# Patient Record
Sex: Male | Born: 1945 | Race: White | Hispanic: No | Marital: Married | Smoking: Former smoker
Health system: Southern US, Community
[De-identification: ages and names within clinical notes are randomized; demographics above are authoritative.]

## PROBLEM LIST (undated history)

## (undated) DIAGNOSIS — I10 Essential (primary) hypertension: Secondary | ICD-10-CM

## (undated) DIAGNOSIS — T884XXA Failed or difficult intubation, initial encounter: Secondary | ICD-10-CM

## (undated) DIAGNOSIS — E785 Hyperlipidemia, unspecified: Secondary | ICD-10-CM

## (undated) DIAGNOSIS — I259 Chronic ischemic heart disease, unspecified: Secondary | ICD-10-CM

## (undated) DIAGNOSIS — E669 Obesity, unspecified: Secondary | ICD-10-CM

## (undated) HISTORY — PX: BACK SURGERY: SHX140

## (undated) HISTORY — DX: Hyperlipidemia, unspecified: E78.5

## (undated) HISTORY — PX: CORONARY ARTERY BYPASS GRAFT: SHX141

## (undated) HISTORY — DX: Obesity, unspecified: E66.9

## (undated) HISTORY — DX: Essential (primary) hypertension: I10

## (undated) HISTORY — DX: Chronic ischemic heart disease, unspecified: I25.9

---

## 1998-05-14 ENCOUNTER — Emergency Department (HOSPITAL_COMMUNITY): Admission: EM | Admit: 1998-05-14 | Discharge: 1998-05-14 | Payer: Self-pay | Admitting: Emergency Medicine

## 1999-08-29 ENCOUNTER — Inpatient Hospital Stay (HOSPITAL_COMMUNITY): Admission: EM | Admit: 1999-08-29 | Discharge: 1999-09-07 | Payer: Self-pay | Admitting: *Deleted

## 1999-08-31 ENCOUNTER — Encounter: Payer: Self-pay | Admitting: Cardiothoracic Surgery

## 1999-09-01 ENCOUNTER — Encounter: Payer: Self-pay | Admitting: Cardiothoracic Surgery

## 1999-09-02 ENCOUNTER — Encounter: Payer: Self-pay | Admitting: Cardiothoracic Surgery

## 1999-09-03 ENCOUNTER — Encounter: Payer: Self-pay | Admitting: Cardiothoracic Surgery

## 1999-09-04 ENCOUNTER — Encounter: Payer: Self-pay | Admitting: Cardiothoracic Surgery

## 1999-09-05 ENCOUNTER — Encounter: Payer: Self-pay | Admitting: Cardiothoracic Surgery

## 2010-03-27 ENCOUNTER — Ambulatory Visit: Payer: Self-pay | Admitting: Cardiology

## 2010-04-01 ENCOUNTER — Ambulatory Visit: Payer: Self-pay | Admitting: Cardiology

## 2010-09-26 NOTE — H&P (Signed)
[place]. [facility]  Patient:    Frank [name], Frank [name]                      MRN: [id number] Adm. Date:  [id number] Attending:  [place], Peter Manning Dictator:   Clovis Pu Patty Sermons, M.D. CC:         [place]. Andrey Campanile, M.D.             CVTS             Peter M. [place], M.D.                         History and Physical  CHIEF COMPLAINT:  Chest pain.  HISTORY OF PRESENT ILLNESS:  This is a 65 year old gentleman with recent onset of exertional chest pain and a two week history of severe hypertension.  He was seen in our office on 08/27/99, at which time his blood pressure was elevated at 196/104, and he gave a history of about one week of exertional chest tightness with activities such as mowing the grass.  He previously had good exercise tolerance, and several weeks ago was able to walk the entire [employer] tournament without any difficulty, and also bike ride without difficulty.  He had been checking his blood pressure intermittently for the past several weeks, and it had been elevated.  He had been on no medication. When seen in the office on 08/27/99, he was felt to have significant hypertensive cardiovascular disease, as well as possible ischemic heart disease with angina.  He was begun on Plavix 75 mg a day, hydrochlorothiazide 25 mg a day, Toprol XL 50 mg a day, and was continued on daily aspirin. Arrangements were made for him to return for a treadmill Cardiolite stress test which was done on 08/29/99.  The patient exercised for 10 minutes on the Bruce protocol.  He had only minimal chest tightness with exercise.  Did not reach his target heart rate.  His target rate was 139, and he reached a maximum of 129.  Test was stopped because of chest tightness and dyspnea and leg cramping.  He had no significant EKG changes during exercise, but immediately post-treadmill did develop striking ST segment elevation in the inferior lead.  He was given a single  nitroglycerin with improvement in his chest discomfort over the next several minutes.  An ambulance was summoned to take him to [facility] Emergency Room.  He was pain-free at the time of his transfer, but it was felt that he would need urgent cardiac catheterization. This was done by Dr. Peter [place] earlier today, and did show significant three-vessel coronary artery disease, and the patient will require bypass surgery.  Of note, is the fact that blood work drawn two days ago was available today, and shows that his cholesterol is elevated at 275, triglycerides of 244, HDL of 34, LDL of 192.  Blood sugar was normal at 101, potassium 4.5, liver function tests normal except SGPT of 46.  His CBC was normal with a hemoglobin of 16.7, hematocrit 49, white count 5600.  FAMILY HISTORY:  The family history reveals that his father died at age 44 of lung cancer, mother died at age 35, and had a lot of vascular blockage in her legs.  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  He is on a no added salt diet.  He drinks 2-6 beers per week.  He drinks a moderate amount of caffeinated iced tea.  The patient is married.  His wife, Frank [name], is a Engineer, civil (consulting).  ALLERGIES:  No known drug allergies.  REVIEW OF SYSTEMS:  GASTROINTESTINAL:  Negative.  GENITOURINARY:  Negative.  The remainder of the review of systems is negative.  PHYSICAL EXAMINATION:  VITAL SIGNS:  Blood pressure is 140/70, pulse 70 and regular.  HEENT:  Unremarkable.  Pupils are equal and reactive.  Extraocular movements are full.  Fundi show no hemorrhages or exudates.  NECK:  Carotids reveal no bruit.  CHEST:  Clear to auscultation and percussion.  HEART:  No murmurs, rubs, gallops, or clicks.  ABDOMEN:  Soft without evidence of hepatosplenomegaly or masses.  EXTREMITIES:  Good pulses, no phlebitis or edema.  IMPRESSION: 1. Ischemic heart disease with three-vessel coronary disease. 2. Chest pain  following treadmill test today, rule out inferior wall    myocardial infarction. 3. Hypercholesterolemia. 4. Hypertensive cardiovascular disease.  DISPOSITION: 1. He is being admitted to telemetry. 2. He will be continued on intravenous heparin and intravenous nitroglycerin,    and he will be treated with beta blockers, aspirin, and lipid lowering    drugs. 3. CVTS will consult regarding coronary bypass surgery.DD:  08/29/99 TD:  08/29/99 Job: [id number]

## 2010-09-26 NOTE — Consult Note (Signed)
. [facility]  Patient:    Frank [name], Frank [name]                      MRN: [id number] Proc. Date: 08/29/99 Adm. Date:  [id number] Attending:  [place], Peter Manning CC:         CVTS             Clovis Pu. Patty Sermons, M.D.             [facility] Cardiology                          Consultation Report  REASON FOR CONSULTATION:  Severe three vessel coronary artery disease with class IV unstable angina.  HISTORY OF PRESENT ILLNESS:  The patient is a 65 year old white male who is admitted to the hospital emergently after developing chest pain and EKG changes  during an exercise tolerance test (stress test).  He developed some substernal tightness and pressure earlier in the week while mowing his lawn.  He was seen y his primary care physician, Dr. Duffy Rhody C. Wilson, and referred to Dr. Clovis Pu. Brackbill for cardiac evaluation.  Because of his ischemic changes during the ETT, he was transferred directly to he emergency room and admitted to the catheterization lab for urgent cardiac catheterization by Dr. Peter M. [place].  This demonstrated severe three vessel coronary disease with 70% stenosis of the LAD, 90% stenosis of the diagonal, 90% stenosis of the ramus intermediate, 95% stenosis of the circumflex, and 90-99% stenosis to the right coronary artery.  His overall ejection fraction was well preserved at 65% and he was stabilized on an IV nitroglycerin and IV heparin.  He was referred for evaluation for coronary revascularization.  His cardiac enzymes are pending.  PAST MEDICAL HISTORY:  The patient has history of mild hypertension and hyperlipidemia.  He was on no regular medications prior to this week.  PAST SURGICAL HISTORY:  Cervical laminectomy and a lumbar laminectomy, both of which were several years ago and were not complicated by any cardiac events.  ALLERGIES:  He is not allergic to any medications.  SOCIAL HISTORY:  He is a nonsmoker.   He is married.  He works as a Human resources officer.  He drinks beer socially.  FAMILY HISTORY:  Positive for peripheral vascular disease in his mother.  REVIEW OF SYSTEMS:  His current weight is 230 pounds and there has been no significant change in this over the past several months.  He denies any recent fever, night sweats, chills, or weakness.  He denies any symptoms of congestive  heart failure including orthopnea, PND, or ankle edema.  He has had angina as stated previously.  His pulmonary history is negative for wheezing, asthma, productive cough, or hemoptysis.  He denies any history of chest trauma.  His GI history is negative for dysphagia, blood per rectum, jaundice, or peptic ulcer disease.  His GU history is negative for nocturia, polyuria, or hematuria.  His  vascular history is negative for claudication, phlebitis, or syncope.  He did have one episode approximately three to four years ago where after spending the day n the [place] and getting into a hot tub he had a brief fainting spell associated with bradycardia which resolved spontaneously.  His neurologic history is negative for seizure, TIA, or CVA.  His hematologic history is negative for bleeding, diaphysis, or easy bruisability.  He has no skin rash or  skin lesion.  He denies any psychological problems with depression, insomnia, or diminished appetite.  PHYSICAL EXAMINATION:  VITAL SIGNS:  He is 5 foot 9 inches and weighs 230 pounds.  His blood pressure s 140/80, pulse is 60 and regular, respirations 18, temperature 98.2.  GENERAL:  That of a middle-aged white male, comfortable in bed following a cardiac catheterization in no distress.  HEENT:  Normocephalic with full EOMs.  Dentition under good repair.  Pharynx is  clear.  NECK:  Supple.  He has good range of motion of the neck.  There are no neck masses, adenopathy, thyromegaly.  No carotid bruit.  No JVD.  LUNGS:  Clear  to auscultation.  There is no thoracic deformity.  CARDIOVASCULAR:  With normal rate and rhythm without S3 gallop or without murmur.  ABDOMEN:  Soft, obese, without mass or organomegaly.  EXTREMITIES:  Full range of motion without warm, tender joints.  There is no evidence of venous insufficiency or edema.  Peripheral pulses are 2+ in the radials and pedals bilaterally.  SKIN:  Clear without rash or lesion.  NEUROLOGICAL:  Alert and oriented x 3 with full motor function.  LABORATORY DATA:  His laboratory data including the findings at catheterization  previously documented.  His hematocrit is 49%.  His platelet count is 286,000. His creatinine is 1.3 mg%.  IMPRESSION:  A 65 year old gentleman with recent onset of angina with unstable angina and severe three vessel coronary disease.  He is scheduled for coronary revascularization on Monday, September 01, 1999.  We will plan on using the left internal mammary artery and the left radial artery for two arterial grafts and he remainder for saphenous vein graft.  I discussed the indications and expected benefits of the operation with the patient and wife.  I discussed the details of the operation including the choice of conduit, placement of the surgical incisions, the use of cardiopulmonary bypass and general anesthesia, and the expected hospital recovery.  I reviewed the associated risks of death, MI, CVA, bleeding.  He understands these implications for surgery and agrees to proceed with the operation as planned under informed consent. DD:  08/29/99 TD:  08/29/99 Job: [id number]

## 2010-09-26 NOTE — Discharge Summary (Signed)
[place]. [facility]  Patient:    Frank [name], Frank [name]                      MRN: [id number] Adm. Date:  [id number] Disc. Date: 09/07/99 Attending:  Kerin Perna Iii Dictator:   Areta Haber, P.A.C. CC:         Frank [name], M.D.             Frank [name], M.D.             Frank [name], M.D.             Frank [name], M.D.                           Discharge Summary  HISTORY OF PRESENT ILLNESS:  This is a 65 year old gentleman with recent onset f exertional chest pain and a two-week history of severe hypertension.  He was evaluated at Dr. Clovis Pu. Brackbills office on August 27, 1999 at which time his blood pressure was elevated at 196/104 and he gave a history of one week of exertional chest tightness with activity such as mowing the grass.  The patient reported previously good exercise tolerance and several weeks ago he was able to walk around the entire [employer] tournament without any difficulty. He also rode bicycles without difficulty.  He had been checking his blood pressure intermittently for the past several weeks and has continued to be elevated.  He was on no medication and was seen in the office and was found to have significant hypertension, cardiovascular disease, s well as possible ischemic heart disease and angina.  He was started on Plavix 75 mg q.d.; hydrochlorothiazide 25 mg q.d.; Toprol XL 50 mg q.d.; as well as one aspirin q.d.  Arrangements were made for exercise treadmill, Cardiolite stress test on August 11, 1999.  He exercised for 10 minutes on the BRUCE protocol with only minimal chest tightness with exercise.  He did not reach his target rate which was 139.  He reached a maximum of 129.  The test was stopped because of chest tightness, dyspnea, and leg cramping.  He had no significant EKG changes during the exercise but immediately post-treadmill did develop striking ST segment elevation in  the  inferior leads.  He was improved with single nitroglycerin and the chest discomfort resolved over the next several minutes.  An ambulance was summoned to take him to [facility] Emergency  Room.  He was pain-free at the time of his transfer and it was felt that he needed urgent cardiac catheterization.  FAMILY HISTORY:  Father died at age 83 of lung cancer.  Mother died at age 63 with peripheral vascular disease.  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  He is on a no added salt diet.  He drinks two to six beers per week.  He drinks a moderate amount of caffeinated ice tea.  He is married.  ALLERGIES:  No known drug allergies.  MEDICATIONS:  As above.  REVIEW OF SYSTEMS:  See history and physical done at the time of admission.  PHYSICAL EXAMINATION:  Please see history and physical done at the time of admission.  HOSPITAL COURSE:  The patient was admitted through the emergency room for cardiac catheterization.  He was found to have severe three vessel coronary artery disease including 50-70%  segmental mid-LAD after the diagonal-2 and an ostial diagonal-1 lesion.  He additionally had a 90% ostial intermediate lesion, a 95% left circumflex mid-artery lesion, a 90% proximal right coronary artery lesion, a 99% distal right coronary artery lesion.  A left ventriculogram was within normal limits with an ejection fraction of 65%.  Cardiac surgical consultation was then obtained with Dr. Kathlee Nations Trigt III who evaluated the patient and his studies and agreed with recommendations for coronary artery bypass grafting.  On September 01, 1999, the patient underwent the following procedure of coronary artery bypass grafting x 6.  The following grafts were placed:  Left internal mammary artery to the LAD, saphenous vein graft to the diagonal, sequential saphenous vein graft to the ramus intermedius and obtuse marginal, saphenous vein  graft to the  posterolateral, left radial artery to the posterior descending.  The patient tolerated the procedure well and was taken to the surgical intensive care unit n stable condition.  The patient has done well.  He has maintained stable hemodynamics and was weaned from the ventilator without difficulty.  He did require blood transfusion postoperative day #1 for a hemoglobin of 7.5.  His progress in regards to cardiac rehabilitation has been good.  He has required a moderate amount of diuresis and this will be continued postdischarge.  He has a small left apical pneumothorax which has shown gradual resolution with no significant difficulties weaning oxygen. He maintains good saturations currently on room air of 94%.  He has maintained  normal sinus rhythm.  His incisions are healing well without signs of infection. He is tolerating a diet.  He has had a bowel movement postsurgery.  He is afebrile.  Most recent laboratory values are felt to be stable.  Hemoglobin and hematocrit  dated September 04, 1999 are 8.7 and 24.0.  Electrolytes same date show sodium 134,  potassium 3.9, chloride 100, carbon dioxide 28, glucose 130, BUN 19, creatinine  0.8.  Overall, the patient is felt to be quite stable for discharge on todays date, September 07, 1999.  CONDITION ON DISCHARGE:  Stable and improving.  DISCHARGE MEDICATIONS: 1. Lipitor 10 mg q.d. 2. Enteric-coated aspirin 325 mg q.d. 3. Ferrous gluconate 300 mg b.i.d. 4. Vitamin C 500 mg b.i.d. 5. Multivitamin 1 q.d. 6. Tenormin 12.5 mg b.i.d. 7. Imdur 30 mg q.d. for one month for radial artery graft. 8. Lasix 40 mg q.d. x 2 weeks. 9. Potassium chloride 20 mEq q.d. x 2 weeks.  FOLLOW-UP:  Follow up will include in two weeks with Dr. Clovis Pu. Brackbill and three weeks with Dr. Kathlee Nations Trigt III.  FINAL DIAGNOSES: 1. Coronary artery disease. 2. Hypercholesterolemia. 3. Hypertension. 4. Remote tobacco abuse. 5.  Postoperative anemia.   DISCHARGE INSTRUCTIONS:  The patient received written instructions regarding medications, activities, diet, wound care, and follow-up. DD:  09/07/99 TD:  09/07/99 Job: [id number]

## 2010-09-26 NOTE — Op Note (Signed)
[place]. [facility]  Patient:    Frank [name], Frank [name]                      MRN: [id number] Proc. Date: 09/01/99 Adm. Date:  [id number] Attending:  Mikey Bussing CC:         CVTS office             Clovis Pu. Patty Sermons, M.D.             [facility] Cardiology                           Operative Report  PREOPERATIVE DIAGNOSIS:  Class 4 unstable angina with severe three vessel coronary disease.  POSTOPERATIVE DIAGNOSIS:  Class 4 unstable angina with severe three vessel coronary disease.  OPERATION:  Coronary artery bypass grafting x 6 (left internal mammary artery to LAD, left radial artery to posterior descending, saphenous vein graft to diagonal, sequential saphenous vein graft to ramus intermediate and OM1, and saphenous vein graft to posterolateral branch on the right).  SURGEON:  Mikey Bussing, M.D.  ASSISTANT:  Areta Haber, P.A.-C.  ANESTHESIA:  General by Janetta Hora. Gelene Mink, M.D.  INDICATIONS:  The patient is a 65 year old male with a long history of coronary  disease who presented with new onset chest pain and was evaluated with urgent cath after he developed EKG changes during a treadmill test.  Cardiac cath demonstrated severe three vessel coronary disease including 99% stenosis of the right coronary, 80% of the LAD, and 95% stenosis of the circumflex.  He was referred for coronary revascularization.  Prior to the operation, the patient was examined in his hospital room following his cardiac catheterization.  The results of the cardiac catheterization were reviewed to the patient and his wife.  The indications and expected benefits of coronary  bypass grafting were discussed.  I reviewed the major aspects of the operation including the location of the surgical incisions, the choice of conduit including mammary artery and radial artery due to his young age, the use of general anesthesia and cardiopulmonary bypass,  and the expected hospital recovery.  We reviewed the associated risk factors of MI, CVA, bleeding, infection, and death. He understood these implications for surgery, the alternatives of surgery, and agreed to proceed with the operation as planned under informed consent.  OPERATIVE FINDINGS:  The patients coronaries are heavily diseased with calcium.  The proximal portion of the aortic root was heavily calcified and only three proximal vein grafts were placed.  A radial artery free graft was placed in the  proximal portion of the diagonal vein due to disease of the ascending aorta. His coronaries also had diffuse atherosclerotic disease.  The saphenous vein was of  above average quality.  Both mammary artery and radial artery were small, approximately 2 mm, but had excellent flow.  DESCRIPTION OF PROCEDURE:  The patient was brought to the operating room and placed supine on the operating room table where general anesthesia was induced under invasive hemodynamic monitoring.  The left arm was prepped and draped as a sterile field, and the left radial artery was then harvested as a free graft, and flushed with papaverine and heparin solution.  The arm incision was closed in two layers using Vicryl, and then the arm was dressed and tucked on the side.  The patient was then prepped and draped as a sterile field, and a median sternotomy  was performed as the saphenous vein was harvested from the right lower extremity.  The left internal mammary artery was harvested as a pedicle graft from its origin at the  subclavian vessels, and was a good vessel with excellent flow.  Heparin was administered systemically and the ACT was documented as being therapeutic.  The  sternal retractor was placed and the pericardium was opened.  Pursestrings were  placed in the ascending aorta and the right atrium.  The patient was cannulated and placed on cardiopulmonary bypass and cooled to 32 degrees.   The heart was inspected and found to be mildly large consistent with a history of mild hypertension. The myocardium was without scarring or fibrosis.  The coronaries were identified and the conduits were prepared.  A cardioplegia cannula was placed and the patient as cooled to 28 degrees.  As the aortic cross-clamp was applied, 600 cc of cold blood cardioplegia was delivered to the aortic root with immediate cardioplegic arrest and septal temperature dropping less than 12 degrees.  Topical ice saline slush was used to augment myocardial preservation and a pericardial insufflator pad was used to protect the left phrenic nerve.  The distal coronary anastomoses were then performed.  The first distal anastomosis was to the diagonal.  It was a 1.5 mm vessel with proximal 95% stenosis and a reversed saphenous vein was sewn end-to-side with a running 7-0 Prolene.  The second distal anastomosis was to the ramus intermediate and the OM1 with a sequential vein graft.  The ramus intermediate was a 1.5 mm vessel with proximal 90% stenosis and a side branch was sewn side-to-side with running 7-0 Prolene.   The third distal anastomosis was a continuation to the OM2 which was a 1.5 mm vessel with proximal 90% stenosis, and the end of the vein was end-to-side with a running 7-0 Prolene.  Cardioplegia was redosed.  The fourth distal anastomosis was to the posterolateral branch of the right coronary which was a 1.8 mm vessel with a proximal 95% stenosis, and reversed saphenous vein sewn end-to-side with a running 7-0 Prolene.  The fifth distal anastomosis was the free left radial artery graft to the posterior descending which was a 1.8 mm vessel with proximal 95% stenosis, and the artery was sewn end-to-side with a running 8-0 Prolene.  Cardioplegia was redosed.  The sixth distal anastomosis was to the distal LAD which was a 1.5 mm vessel with proximal 90% stenosis.  The left internal  mammary artery pedicle was brought through an opening created in the left lateral pericardium and was brought down on  the ________ and sewn end-to-side with a running 8-0 Prolene.  There was excellent flow through the anastomosis with immediate rise in septal temperature after release of the pedicle clamp on the mammary artery.  The mammary and pedicle was secured to the epicardium.  The aortic cross-clamp was removed.  The heart resumed a spontaneous rhythm.  A partial occluding clamp was placed on the ascending aorta and three proximal vein anastomoses were placed carefully with disease in the inferior aspect of the aorta, preventing a fourth aortotomy for he radial artery free graft.  The partial occluding clamp was removed and the radial artery graft was sewn to the middle vein anastomosis to the diagonal in an end-to-side fashion with a running 7-0 Prolene.  There was excellent flow through all grafts after release of the Fogarty vascular clamps.  The patient was then rewarmed and reperfused.  The patient remained in a sinus rhythm.  He was warmed to 37 degrees.  Temporary pacing wires were applied.  When the patient reached  37 degrees, the lungs re-expanded, and the ventilator was turned back on.  The patient then weaned from cardiopulmonary bypass without difficulty, without inotropics, with stable blood pressure and hemodynamics.  Protamine was administered and cannulas were removed.  The mediastinum was irrigated with warm antibiotic irrigation.  The mediastinum had adequate hemostasis.  The leg incision was closed in a standard fashion.  The pericardium was loosely  reapproximated.  Two mediastinal and a left pleural chest tube were placed and brought out through separate incisions.  The sternum was reapproximated with interrupted steel wire.  The pectoralis fascia and the subcutaneous layers were  closed with a running Vicryl.  The skin was closed with a  subcuticular and sterile dressings were applied.  Total cardiopulmonary bypass time was 128 minutes with  aortic cross-clamp time of 80 minutes. DD:  09/01/99 TD:  09/02/99 Job: [id number]

## 2010-09-26 NOTE — Cardiovascular Report (Signed)
[facility]  Patient:    GOVERNOR, MATOS                      MRN: [id number] Proc. Date: 08/29/99 Adm. Date:  [id number] Attending:  [place], Peter Manning CC:         Hardie A. Patty Sermons, M.D.             Mikey Bussing, M.D.             Cardiac Catheterization Lab                        Cardiac Catheterization  INDICATIONS FOR PROCEDURE:  The patient is a 65 year old white male with recent onset of chest pain.  While performing an exercise stress test earlier today, the patient developed acute chest pain with marked ST elevation inferiorly.  ACCESS:  Via the right femoral artery using standard Seldinger technique.  EQUIPMENT:  The 6-French 4-cm right and left Judkins catheters, 6-French pigtail catheter, 6-French arterial sheath.  MEDICATIONS:  Local anesthesia 1% Xylocaine.  CONTRAST:  Omnipaque, 130 cc.  HEMODYNAMIC DATA:  Aortic pressure is 133/73 with a mean of 95.  Left ventricular pressure is 126 with and EDP of 13 mmHg.  ANGIOGRAPHIC DATA:  The left coronary artery arises and distributes normally. 1. The left main coronary artery is without significant disease. 2. The left anterior descending artery is diffusely diseased.  It has a 50%    stenosis following the first diagonal and first septal perforator.    Following the second diagonal, the LAD has a 70% segmental stenosis.  The    first diagonal branch has a 90% stenosis at its ostium. 3. There is a moderate sized intermedius vessel which has a 90% stenosis    proximally. 4. The left circumflex coronary has a 90% stenosis in the mid vessel prior to    an obtuse marginal vessel. 5. The right coronary artery arises and distributes normally.  It is a    dominant vessel.  There is a focal 90% stenosis proximally.  There is a    long segmental 80% stenosis in the crux followed by a 99% stenosis in the    distal right coronary artery prior to the takeoff of the PDA.  Left  ventricular angiography performed in the RAO view:  This demonstrates normal left ventricular chamber size, wall thickness, and contractility with normal systolic function.  Ejection fraction is estimated at 65%.  There is no mitral regurgitation or prolapse.  FINAL INTERPRETATION: 1. 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 2. Normal left ventricular function.  PLAN:  Would recommend coronary artery bypass surgery. DD:  08/29/99 TD:  08/31/99 Job: [id number]

## 2010-10-09 ENCOUNTER — Ambulatory Visit: Payer: Self-pay | Admitting: Cardiology

## 2010-10-20 ENCOUNTER — Other Ambulatory Visit: Payer: Self-pay | Admitting: *Deleted

## 2010-10-20 MED ORDER — SIMVASTATIN 80 MG PO TABS: 80.0000 mg | ORAL_TABLET | Freq: Every day | ORAL | Status: DC

## 2010-10-20 MED ORDER — RAMIPRIL 10 MG PO CAPS: 10.0000 mg | ORAL_CAPSULE | Freq: Every day | ORAL | Status: DC

## 2010-10-20 NOTE — Telephone Encounter (Signed)
Refilled meds per fax request.  

## 2011-01-08 ENCOUNTER — Other Ambulatory Visit: Payer: Self-pay | Admitting: *Deleted

## 2011-01-08 DIAGNOSIS — I10 Essential (primary) hypertension: Secondary | ICD-10-CM

## 2011-01-08 MED ORDER — ATENOLOL 25 MG PO TABS: ORAL_TABLET | ORAL | Status: DC

## 2011-01-08 NOTE — Telephone Encounter (Signed)
Refilled atenolol

## 2011-10-17 ENCOUNTER — Encounter: Payer: Self-pay | Admitting: *Deleted

## 2011-10-26 ENCOUNTER — Other Ambulatory Visit: Payer: Self-pay | Admitting: Cardiology

## 2011-10-28 NOTE — Telephone Encounter (Signed)
Fax Received. Refill Completed. Frank [name] (R.M.A)   

## 2012-02-02 ENCOUNTER — Other Ambulatory Visit: Payer: Self-pay | Admitting: Cardiology

## 2012-02-02 DIAGNOSIS — I25810 Atherosclerosis of coronary artery bypass graft(s) without angina pectoris: Secondary | ICD-10-CM

## 2012-07-06 ENCOUNTER — Encounter: Payer: Self-pay | Admitting: Cardiology

## 2012-11-09 ENCOUNTER — Other Ambulatory Visit: Payer: Self-pay | Admitting: *Deleted

## 2012-11-09 DIAGNOSIS — E7800 Pure hypercholesterolemia, unspecified: Secondary | ICD-10-CM

## 2012-11-09 MED ORDER — SIMVASTATIN 80 MG PO TABS: ORAL_TABLET | ORAL | Status: DC

## 2012-11-09 MED ORDER — RAMIPRIL 10 MG PO CAPS: ORAL_CAPSULE | ORAL | Status: DC

## 2012-11-09 NOTE — Telephone Encounter (Signed)
Ok to fill per  Dr. Patty Sermons, note to call for an appointment

## 2012-12-05 ENCOUNTER — Other Ambulatory Visit: Payer: Self-pay | Admitting: *Deleted

## 2012-12-05 MED ORDER — RAMIPRIL 10 MG PO CAPS: ORAL_CAPSULE | ORAL | Status: DC

## 2013-02-15 ENCOUNTER — Encounter: Payer: Self-pay | Admitting: Cardiology

## 2013-02-15 ENCOUNTER — Ambulatory Visit (INDEPENDENT_AMBULATORY_CARE_PROVIDER_SITE_OTHER): Payer: Medicare Other | Admitting: Cardiology

## 2013-02-15 VITALS — BP 132/70 | HR 54 | Ht 69.0 in | Wt 252.0 lb

## 2013-02-15 DIAGNOSIS — I119 Hypertensive heart disease without heart failure: Secondary | ICD-10-CM

## 2013-02-15 DIAGNOSIS — I25810 Atherosclerosis of coronary artery bypass graft(s) without angina pectoris: Secondary | ICD-10-CM

## 2013-02-15 DIAGNOSIS — E7800 Pure hypercholesterolemia, unspecified: Secondary | ICD-10-CM | POA: Insufficient documentation

## 2013-02-15 DIAGNOSIS — I259 Chronic ischemic heart disease, unspecified: Secondary | ICD-10-CM

## 2013-02-15 MED ORDER — SIMVASTATIN 80 MG PO TABS: ORAL_TABLET | ORAL | Status: DC

## 2013-02-15 MED ORDER — ATENOLOL 25 MG PO TABS: ORAL_TABLET | ORAL | Status: DC

## 2013-02-15 MED ORDER — EZETIMIBE 10 MG PO TABS: 10.0000 mg | ORAL_TABLET | Freq: Every day | ORAL | Status: DC

## 2013-02-15 MED ORDER — TADALAFIL 20 MG PO TABS: 20.0000 mg | ORAL_TABLET | ORAL | Status: DC | PRN

## 2013-02-15 MED ORDER — RAMIPRIL 10 MG PO CAPS: ORAL_CAPSULE | ORAL | Status: DC

## 2013-02-15 NOTE — Assessment & Plan Note (Signed)
The patient remains on simvastatin 80 mg daily.  Is not having any myalgias.  We are checking fasting lab work today.

## 2013-02-15 NOTE — Assessment & Plan Note (Signed)
Blood pressure was remaining stable.  No headaches dizziness or syncope.  No symptoms of CHF.  No palpitations.

## 2013-02-15 NOTE — Patient Instructions (Signed)
Return soon for labs  Your physician recommends that you continue on your current medications as directed. Please refer to the Current Medication list given to you today.  Your physician wants you to follow-up in: 6 months with fasting labs (lp/bmet/hfp)  You will receive a reminder letter in the mail two months in advance. If you don't receive a letter, please call our office to schedule the follow-up appointment.

## 2013-02-15 NOTE — Progress Notes (Signed)
Frank [name] Date of Birth:  11-11-1945 [facility] [address] [place], [place]  [address] [phone]         Fax   [phone]  History of Present Illness: This pleasant 67 year old gentleman is seen after a long absence.  He has a history of known ischemic heart disease.  He had coronary artery bypass graft surgery in 2001.  His last Myoview was 03/12/05 and at that time he had an ejection fraction of 50% and no evidence of ischemia.  He has a past history of hypercholesterolemia and essential hypertension and exogenous obesity.  His last visit he has been doing well.  He is trying to lose weight and since we last saw him in 2011 his weight is down 7 more pounds.  Current Outpatient Prescriptions  Medication Sig Dispense Refill  . Ascorbic Acid (VITAMIN C) 100 MG tablet Take 100 mg by mouth daily.      Marland Kitchen aspirin 325 MG tablet Take 325 mg by mouth daily.      Marland Kitchen atenolol (TENORMIN) 25 MG tablet take 1/2 tablet by mouth twice a day  90 tablet  3  . ezetimibe (ZETIA) 10 MG tablet Take 1 tablet (10 mg total) by mouth daily.  90 tablet  3  . naproxen sodium (ANAPROX) 220 MG tablet Take 220 mg by mouth as needed.      . ramipril (ALTACE) 10 MG capsule take 1 capsule by mouth once daily  90 capsule  3  . simvastatin (ZOCOR) 80 MG tablet take 1 tablet by mouth at bedtime  90 tablet  3  . tadalafil (CIALIS) 20 MG tablet Take 1 tablet (20 mg total) by mouth as needed.  10 tablet  prn   No current facility-administered medications for this visit.    Not on File  Patient Active Problem List   Diagnosis Date Noted  . Ischemic heart disease 02/15/2013  . Pure hypercholesterolemia 02/15/2013  . Benign hypertensive heart disease without heart failure 02/15/2013    History  Smoking status  . Former Smoker  Smokeless tobacco  . Not on file    History  Alcohol Use  . Yes    Comment: 2 to 6 beers a week    Family History  Problem Relation Age of Onset    . Cancer Father 81  . Peripheral vascular disease Mother 11    deceased    Review of Systems: Constitutional: no fever chills diaphoresis or fatigue or change in weight.  Head and neck: no hearing loss, no epistaxis, no photophobia or visual disturbance. Respiratory: No cough, shortness of breath or wheezing. Cardiovascular: No chest pain peripheral edema, palpitations. Gastrointestinal: No abdominal distention, no abdominal pain, no change in bowel habits hematochezia or melena. Genitourinary: No dysuria, no frequency, no urgency, no nocturia. Musculoskeletal:No arthralgias, no back pain, no gait disturbance or myalgias. Neurological: No dizziness, no headaches, no numbness, no seizures, no syncope, no weakness, no tremors. Hematologic: No lymphadenopathy, no easy bruising. Psychiatric: No confusion, no hallucinations, no sleep disturbance.    Physical Exam: Filed Vitals:   02/15/13 1527  BP: 132/70  Pulse: 54   the general appearance reveals a well-developed well-nourished mildly obese gentleman in no distress.The head and neck exam reveals pupils equal and reactive.  Extraocular movements are full.  There is no scleral icterus.  The mouth and pharynx are normal.  The neck is supple.  The carotids reveal no bruits.  The jugular venous pressure  is normal.  The  thyroid is not enlarged.  There is no lymphadenopathy.  The chest is clear to percussion and auscultation.  There are no rales or rhonchi.  Expansion of the chest is symmetrical.  The precordium is quiet.  The first heart sound is normal.  The second heart sound is physiologically split.  There is no murmur gallop rub or click.  There is no abnormal lift or heave.  The abdomen is soft and nontender.  The bowel sounds are normal.  The liver and spleen are not enlarged.  There are no abdominal masses.  There are no abdominal bruits.  Extremities reveal good pedal pulses.  There is no phlebitis or edema.  There is no cyanosis or  clubbing.  Strength is normal and symmetrical in all extremities.  There is no lateralizing weakness.  There are no sensory deficits.  The skin is warm and dry.  There is no rash.  EKG shows sinus bradycardia and left axis deviation and no ischemic changes.  There are no prior EKGs currently available for comparison.   Assessment / Plan: Continue same medication.  Continue heart healthy diet and weight loss.  Recheck in 6 months for followup office visit and fasting lab work.  We are also checking lab work today.

## 2013-02-15 NOTE — Assessment & Plan Note (Signed)
The patient has not had any recurrence of chest pain or angina.  He gets some exercise by playing golf about once a week.  He is now working at a part-time job as a Acupuncturist person.

## 2013-02-17 ENCOUNTER — Other Ambulatory Visit: Payer: Medicare Other

## 2013-02-17 ENCOUNTER — Other Ambulatory Visit (INDEPENDENT_AMBULATORY_CARE_PROVIDER_SITE_OTHER): Payer: Medicare Other

## 2013-02-17 DIAGNOSIS — I119 Hypertensive heart disease without heart failure: Secondary | ICD-10-CM

## 2013-02-17 DIAGNOSIS — E7800 Pure hypercholesterolemia, unspecified: Secondary | ICD-10-CM

## 2013-02-17 LAB — HEPATIC FUNCTION PANEL
ALT: 17 U/L (ref 0–53)
Alkaline Phosphatase: 82 U/L (ref 39–117)
Bilirubin, Direct: 0.1 mg/dL (ref 0.0–0.3)
Total Bilirubin: 0.9 mg/dL (ref 0.3–1.2)
Total Protein: 6.4 g/dL (ref 6.0–8.3)

## 2013-02-17 LAB — LIPID PANEL
Cholesterol: 97 mg/dL (ref 0–200)
LDL Cholesterol: 59 mg/dL (ref 0–99)

## 2013-02-17 LAB — BASIC METABOLIC PANEL
BUN: 14 mg/dL (ref 6–23)
Calcium: 8.6 mg/dL (ref 8.4–10.5)
Chloride: 109 mEq/L (ref 96–112)
Creatinine, Ser: 0.9 mg/dL (ref 0.4–1.5)
GFR: 94.82 mL/min (ref >60.00)
Glucose, Bld: 100 mg/dL — ABNORMAL HIGH (ref 70–99)

## 2013-02-19 NOTE — Progress Notes (Signed)
Quick Note:  Please report to patient. The recent labs are stable. The cholesterol is lower than it needs to be. Decrease simvastatin to 40 mg daily to reduce chances of long term muscle damage from statins. ______

## 2013-02-21 ENCOUNTER — Telehealth: Payer: Self-pay | Admitting: Nurse Practitioner

## 2013-02-21 ENCOUNTER — Encounter: Payer: Self-pay | Admitting: Nurse Practitioner

## 2013-02-21 DIAGNOSIS — E7800 Pure hypercholesterolemia, unspecified: Secondary | ICD-10-CM

## 2013-02-21 MED ORDER — SIMVASTATIN 40 MG PO TABS: ORAL_TABLET | ORAL | Status: DC

## 2013-02-21 NOTE — Telephone Encounter (Signed)
Message copied by Levi Aland on Tue Feb 21, 2013 12:40 PM ------      Message from: Cassell Clement      Created: Sun Feb 19, 2013  8:44 PM       Please report to patient.  The recent labs are stable. The cholesterol is lower than it needs to be. Decrease simvastatin to 40 mg daily to reduce chances of long term muscle damage from statins. ------

## 2013-02-21 NOTE — Telephone Encounter (Signed)
Reviewed lipid results with patient who verbalized understanding.  Simvastatin changed from 80 mg to 40 mg per Dr. Patty Sermons.  Patient verbalized understanding of medication change.

## 2013-02-21 NOTE — Telephone Encounter (Signed)
Gave patient a mychart activation code over the telephone per his request

## 2013-03-16 ENCOUNTER — Other Ambulatory Visit: Payer: Self-pay

## 2013-08-16 ENCOUNTER — Other Ambulatory Visit (INDEPENDENT_AMBULATORY_CARE_PROVIDER_SITE_OTHER): Payer: Medicare Other

## 2013-08-16 DIAGNOSIS — E7800 Pure hypercholesterolemia, unspecified: Secondary | ICD-10-CM

## 2013-08-16 DIAGNOSIS — I119 Hypertensive heart disease without heart failure: Secondary | ICD-10-CM

## 2013-08-16 LAB — BASIC METABOLIC PANEL
BUN: 16 mg/dL (ref 6–23)
CALCIUM: 8.4 mg/dL (ref 8.4–10.5)
CO2: 24 meq/L (ref 19–32)
Chloride: 108 mEq/L (ref 96–112)
Creatinine, Ser: 0.8 mg/dL (ref 0.4–1.5)
GFR: 101.55 mL/min (ref >60.00)
Glucose, Bld: 81 mg/dL (ref 70–99)
Potassium: 3.9 mEq/L (ref 3.5–5.1)
SODIUM: 139 meq/L (ref 135–145)

## 2013-08-16 LAB — HEPATIC FUNCTION PANEL
ALBUMIN: 3.4 g/dL — AB (ref 3.5–5.2)
ALT: 16 U/L (ref 0–53)
AST: 20 U/L (ref 0–37)
Alkaline Phosphatase: 87 U/L (ref 39–117)
Bilirubin, Direct: 0 mg/dL (ref 0.0–0.3)
Total Bilirubin: 0.7 mg/dL (ref 0.3–1.2)
Total Protein: 6.3 g/dL (ref 6.0–8.3)

## 2013-08-16 LAB — LIPID PANEL
CHOL/HDL RATIO: 7
Cholesterol: 182 mg/dL (ref 0–200)
HDL: 26.1 mg/dL — AB (ref >39.00)
LDL Cholesterol: 140 mg/dL — ABNORMAL HIGH (ref 0–99)
TRIGLYCERIDES: 79 mg/dL (ref 0.0–149.0)
VLDL: 15.8 mg/dL (ref 0.0–40.0)

## 2013-08-24 ENCOUNTER — Telehealth: Payer: Self-pay | Admitting: *Deleted

## 2013-08-24 MED ORDER — EZETIMIBE 10 MG PO TABS: 10.0000 mg | ORAL_TABLET | Freq: Every day | ORAL | Status: DC

## 2013-08-24 NOTE — Telephone Encounter (Signed)
Message copied by Earvin Hansen on Thu Aug 24, 2013  5:17 PM ------      Message from: [name]      Created: Thu Aug 17, 2013  2:28 PM       Is he able to take the Zetia? (ok to have stopped the simvastatin).             ------

## 2013-08-24 NOTE — Telephone Encounter (Signed)
Spoke with patient and he does not remember having any issues with Zetia and will try again. Rx sent to pharmacy and will discuss further at ov next month

## 2013-09-13 ENCOUNTER — Encounter: Payer: Self-pay | Admitting: Cardiology

## 2013-09-13 ENCOUNTER — Ambulatory Visit (INDEPENDENT_AMBULATORY_CARE_PROVIDER_SITE_OTHER): Payer: Medicare Other | Admitting: Cardiology

## 2013-09-13 VITALS — BP 152/80 | HR 42 | Ht 69.0 in | Wt 231.4 lb

## 2013-09-13 DIAGNOSIS — I491 Atrial premature depolarization: Secondary | ICD-10-CM

## 2013-09-13 DIAGNOSIS — I119 Hypertensive heart disease without heart failure: Secondary | ICD-10-CM

## 2013-09-13 DIAGNOSIS — E7800 Pure hypercholesterolemia, unspecified: Secondary | ICD-10-CM

## 2013-09-13 DIAGNOSIS — I259 Chronic ischemic heart disease, unspecified: Secondary | ICD-10-CM

## 2013-09-13 MED ORDER — ATORVASTATIN CALCIUM 10 MG PO TABS: ORAL_TABLET | ORAL | Status: DC

## 2013-09-13 NOTE — Progress Notes (Signed)
Melanie Crazier Mill Date of Birth:  12/19/1945 [facility] [address] [place], [place]  [address] [phone]        Fax   [phone]   History of Present Illness: This pleasant 68 year old gentleman is seen for a six-month followup office visit. He has a history of known ischemic heart disease. He had coronary artery bypass graft surgery in 2001. His last Myoview was 03/12/05 and at that time he had an ejection fraction of 50% and no evidence of ischemia. He has a past history of hypercholesterolemia and essential hypertension and exogenous obesity. His last visit he has been doing well. He is trying to lose weight and since we last saw him 6 months ago his weight is down 21 pounds intentionally.  He developed severe myalgias and muscle weakness from simvastatin.  Originally he was on 80 mg a day and more recently on 40 mg a day but even that caused intolerable symptoms.  He has been off his statins.  Recent lab work is not satisfactory with an LDL cholesterol of 140.  Current Outpatient Prescriptions  Medication Sig Dispense Refill  . Ascorbic Acid (VITAMIN C) 100 MG tablet Take 100 mg by mouth daily.      Marland Kitchen aspirin 325 MG tablet Take 325 mg by mouth daily.      Marland Kitchen atenolol (TENORMIN) 25 MG tablet take 1/2 tablet by mouth twice a day  90 tablet  3  . naproxen sodium (ANAPROX) 220 MG tablet Take 220 mg by mouth as needed.      . ramipril (ALTACE) 10 MG capsule take 1 capsule by mouth once daily  90 capsule  3  . tadalafil (CIALIS) 20 MG tablet Take 1 tablet (20 mg total) by mouth as needed.  10 tablet  prn  . atorvastatin (LIPITOR) 10 MG tablet DAILY OR AS DIRECTED  30 tablet  5   No current facility-administered medications for this visit.    Allergies  Allergen Reactions  . Simvastatin     "Muscle aches"    Patient Active Problem List   Diagnosis Date Noted  . Premature atrial contractions 09/13/2013  . Ischemic heart disease 02/15/2013  . Pure  hypercholesterolemia 02/15/2013  . Benign hypertensive heart disease without heart failure 02/15/2013    History  Smoking status  . Former Smoker  Smokeless tobacco  . Not on file    History  Alcohol Use  . Yes    Comment: 2 to 6 beers a week    Family History  Problem Relation Age of Onset  . Cancer Father 83  . Peripheral vascular disease Mother 55    deceased    Review of Systems: Constitutional: no fever chills diaphoresis or fatigue or change in weight.  Head and neck: no hearing loss, no epistaxis, no photophobia or visual disturbance. Respiratory: No cough, shortness of breath or wheezing. Cardiovascular: No chest pain peripheral edema, palpitations. Gastrointestinal: No abdominal distention, no abdominal pain, no change in bowel habits hematochezia or melena. Genitourinary: No dysuria, no frequency, no urgency, no nocturia. Musculoskeletal:No arthralgias, no back pain, no gait disturbance or myalgias. Neurological: No dizziness, no headaches, no numbness, no seizures, no syncope, no weakness, no tremors. Hematologic: No lymphadenopathy, no easy bruising. Psychiatric: No confusion, no hallucinations, no sleep disturbance.    Physical Exam: Filed Vitals:   09/13/13 1109  BP: 152/80  Pulse: 42   general appearance reveals a well-developed well-nourished, no distress.  He is mildly overweight.The  head and neck exam reveals pupils equal and reactive.  Extraocular movements are full.  There is no scleral icterus.  The mouth and pharynx are normal.  The neck is supple.  The carotids reveal no bruits.  The jugular venous pressure is normal.  The  thyroid is not enlarged.  There is no lymphadenopathy.  The chest is clear to percussion and auscultation.  There are no rales or rhonchi.  Expansion of the chest is symmetrical.  The precordium is quiet.  The first heart sound is normal.  The second heart sound is physiologically split.  There is no murmur gallop rub or click.   There is no abnormal lift or heave.  The abdomen is soft and nontender.  The bowel sounds are normal.  The liver and spleen are not enlarged.  There are no abdominal masses.  There are no abdominal bruits.  Extremities reveal good pedal pulses.  There is no phlebitis or edema.  There is no cyanosis or clubbing.  Strength is normal and symmetrical in all extremities.  There is no lateralizing weakness.  There are no sensory deficits.  The skin is warm and dry.  There is no rash.  EKG shows sinus bradycardia with frequent PACs and a pattern of atrial bigeminy.  No ischemic changes.  There is left axis deviation.   Assessment / Plan: 1. ischemic heart disease status post CABG in 2001 2. 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 benign hypertension without heart failure  Plan: Continue same medication.  Avoid over-the-counter decongestants. In regard to his hypercholesterolemia and elevated LDL we will switch to Lipitor low dose 10 mg one every other day initially and advance to one daily if tolerated. Recheck in 6 months for office visit lipid panel hepatic function panel and basal metabolic panel

## 2013-09-13 NOTE — Patient Instructions (Signed)
START LIPITOR 10 MG 1 TABLET 3 TIMES A WEEK AND INCREASE AS TOLERATED (GOAL TO TAKE 1 DAILY)  Your physician wants you to follow-up in: 6 months with fasting labs (lp/bmet/hfp) You will receive a reminder letter in the mail two months in advance. If you don't receive a letter, please call our office to schedule the follow-up appointment.   YOU NEED TO GET AN AT HOME BLOOD PRESSURE MONITOR (AMRON) AND MONITOR YOUR BLOOD PRESSURE AT HOME, CALL IN A COUPLE OF WEEKS WITH READING

## 2013-09-13 NOTE — Assessment & Plan Note (Signed)
We rechecked his blood pressure today which was still up on recheck.  He has been taking over-the-counter decongestants which may have adversely affected his blood pressure.  He will obtain a blood pressure cuff at home and monitor his pressures.  If blood pressure remains elevated we will want to increase his blood pressure medication further.

## 2013-09-13 NOTE — Assessment & Plan Note (Signed)
The patient has had no recurrent chest pain or angina.  He has not been riding his bike as much because of his leg weakness.

## 2013-09-13 NOTE — Assessment & Plan Note (Signed)
On exam today he has an irregular pulse.  EKG shows that he is having atrial bigeminy.  He has been taking a decongestant over-the-counter for seasonal allergies.  He already avoids caffeine in his diet.  He will try to stop the decongestant and see if the arrhythmia improved.  The patient herself is asymptomatic in terms of his pulse.

## 2013-09-13 NOTE — Assessment & Plan Note (Signed)
He has a past history of documented coronary disease.  He is 14 years post CABG.  He needs to be on a statin if at all possible.  We will try very low dose Lipitor 10 mg initially just every other day and he will increase it as tolerated to every day.

## 2014-02-15 ENCOUNTER — Other Ambulatory Visit: Payer: Self-pay | Admitting: Cardiology

## 2014-02-22 ENCOUNTER — Emergency Department (HOSPITAL_COMMUNITY): Payer: Medicare Other

## 2014-02-22 ENCOUNTER — Inpatient Hospital Stay (HOSPITAL_COMMUNITY)
Admission: EM | Admit: 2014-02-22 | Discharge: 2014-03-02 | DRG: 871 | Disposition: A | Discharge status: Home / Self Care | Payer: Medicare Other | Attending: Internal Medicine | Admitting: Internal Medicine

## 2014-02-22 DIAGNOSIS — D62 Acute posthemorrhagic anemia: Secondary | ICD-10-CM | POA: Diagnosis not present

## 2014-02-22 DIAGNOSIS — D696 Thrombocytopenia, unspecified: Secondary | ICD-10-CM

## 2014-02-22 DIAGNOSIS — I2510 Atherosclerotic heart disease of native coronary artery without angina pectoris: Secondary | ICD-10-CM | POA: Diagnosis present

## 2014-02-22 DIAGNOSIS — Z809 Family history of malignant neoplasm, unspecified: Secondary | ICD-10-CM

## 2014-02-22 DIAGNOSIS — R188 Other ascites: Secondary | ICD-10-CM

## 2014-02-22 DIAGNOSIS — E669 Obesity, unspecified: Secondary | ICD-10-CM | POA: Diagnosis present

## 2014-02-22 DIAGNOSIS — Z23 Encounter for immunization: Secondary | ICD-10-CM

## 2014-02-22 DIAGNOSIS — K529 Noninfective gastroenteritis and colitis, unspecified: Secondary | ICD-10-CM

## 2014-02-22 DIAGNOSIS — I259 Chronic ischemic heart disease, unspecified: Secondary | ICD-10-CM

## 2014-02-22 DIAGNOSIS — Z6823 Body mass index (BMI) 23.0-23.9, adult: Secondary | ICD-10-CM | POA: Diagnosis not present

## 2014-02-22 DIAGNOSIS — K7460 Unspecified cirrhosis of liver: Secondary | ICD-10-CM | POA: Diagnosis present

## 2014-02-22 DIAGNOSIS — K5900 Constipation, unspecified: Secondary | ICD-10-CM | POA: Diagnosis present

## 2014-02-22 DIAGNOSIS — K5289 Other specified noninfective gastroenteritis and colitis: Secondary | ICD-10-CM | POA: Diagnosis not present

## 2014-02-22 DIAGNOSIS — E872 Acidosis: Secondary | ICD-10-CM | POA: Diagnosis present

## 2014-02-22 DIAGNOSIS — Z7982 Long term (current) use of aspirin: Secondary | ICD-10-CM | POA: Diagnosis not present

## 2014-02-22 DIAGNOSIS — C787 Secondary malignant neoplasm of liver and intrahepatic bile duct: Secondary | ICD-10-CM

## 2014-02-22 DIAGNOSIS — R160 Hepatomegaly, not elsewhere classified: Secondary | ICD-10-CM

## 2014-02-22 DIAGNOSIS — C189 Malignant neoplasm of colon, unspecified: Secondary | ICD-10-CM | POA: Diagnosis present

## 2014-02-22 DIAGNOSIS — E785 Hyperlipidemia, unspecified: Secondary | ICD-10-CM | POA: Diagnosis not present

## 2014-02-22 DIAGNOSIS — E7800 Pure hypercholesterolemia, unspecified: Secondary | ICD-10-CM | POA: Diagnosis present

## 2014-02-22 DIAGNOSIS — R600 Localized edema: Secondary | ICD-10-CM | POA: Diagnosis present

## 2014-02-22 DIAGNOSIS — N170 Acute kidney failure with tubular necrosis: Secondary | ICD-10-CM | POA: Diagnosis present

## 2014-02-22 DIAGNOSIS — K649 Unspecified hemorrhoids: Secondary | ICD-10-CM | POA: Diagnosis not present

## 2014-02-22 DIAGNOSIS — A419 Sepsis, unspecified organism: Secondary | ICD-10-CM | POA: Diagnosis present

## 2014-02-22 DIAGNOSIS — Z79899 Other long term (current) drug therapy: Secondary | ICD-10-CM | POA: Diagnosis not present

## 2014-02-22 DIAGNOSIS — Z951 Presence of aortocoronary bypass graft: Secondary | ICD-10-CM | POA: Diagnosis not present

## 2014-02-22 DIAGNOSIS — K921 Melena: Secondary | ICD-10-CM | POA: Diagnosis present

## 2014-02-22 DIAGNOSIS — D500 Iron deficiency anemia secondary to blood loss (chronic): Secondary | ICD-10-CM | POA: Diagnosis present

## 2014-02-22 DIAGNOSIS — C799 Secondary malignant neoplasm of unspecified site: Secondary | ICD-10-CM

## 2014-02-22 DIAGNOSIS — Z87891 Personal history of nicotine dependence: Secondary | ICD-10-CM | POA: Diagnosis not present

## 2014-02-22 DIAGNOSIS — I491 Atrial premature depolarization: Secondary | ICD-10-CM

## 2014-02-22 DIAGNOSIS — Z8249 Family history of ischemic heart disease and other diseases of the circulatory system: Secondary | ICD-10-CM | POA: Diagnosis not present

## 2014-02-22 DIAGNOSIS — Z888 Allergy status to other drugs, medicaments and biological substances status: Secondary | ICD-10-CM

## 2014-02-22 DIAGNOSIS — D72829 Elevated white blood cell count, unspecified: Secondary | ICD-10-CM

## 2014-02-22 DIAGNOSIS — R197 Diarrhea, unspecified: Secondary | ICD-10-CM

## 2014-02-22 DIAGNOSIS — R609 Edema, unspecified: Secondary | ICD-10-CM

## 2014-02-22 DIAGNOSIS — N179 Acute kidney failure, unspecified: Secondary | ICD-10-CM

## 2014-02-22 DIAGNOSIS — I119 Hypertensive heart disease without heart failure: Secondary | ICD-10-CM | POA: Diagnosis not present

## 2014-02-22 DIAGNOSIS — C220 Liver cell carcinoma: Secondary | ICD-10-CM

## 2014-02-22 LAB — COMPREHENSIVE METABOLIC PANEL
ALK PHOS: 256 U/L — AB (ref 39–117)
ALT: 27 U/L (ref 0–53)
AST: 45 U/L — AB (ref 0–37)
Albumin: 3 g/dL — ABNORMAL LOW (ref 3.5–5.2)
Anion gap: 21 — ABNORMAL HIGH (ref 5–15)
BUN: 20 mg/dL (ref 6–23)
CHLORIDE: 107 meq/L (ref 96–112)
CO2: 15 meq/L — AB (ref 19–32)
Calcium: 8.9 mg/dL (ref 8.4–10.5)
Creatinine, Ser: 1.09 mg/dL (ref 0.50–1.35)
GFR, EST AFRICAN AMERICAN: 77 mL/min — AB (ref >90)
GFR, EST NON AFRICAN AMERICAN: 67 mL/min — AB (ref >90)
GLUCOSE: 143 mg/dL — AB (ref 70–99)
POTASSIUM: 4.6 meq/L (ref 3.7–5.3)
Sodium: 143 mEq/L (ref 137–147)
Total Bilirubin: 1.3 mg/dL — ABNORMAL HIGH (ref 0.3–1.2)
Total Protein: 6.6 g/dL (ref 6.0–8.3)

## 2014-02-22 LAB — TROPONIN I: Troponin I: <0.3 ng/mL (ref <0.30)

## 2014-02-22 LAB — CBC WITH DIFFERENTIAL/PLATELET
BASOS PCT: 0 % (ref 0–1)
Basophils Absolute: 0 10*3/uL (ref 0.0–0.1)
EOS PCT: 0 % (ref 0–5)
Eosinophils Absolute: 0 10*3/uL (ref 0.0–0.7)
HCT: 40.8 % (ref 39.0–52.0)
HEMOGLOBIN: 10.3 g/dL — AB (ref 13.0–17.0)
LYMPHS PCT: 12 % (ref 12–46)
Lymphs Abs: 4.8 10*3/uL — ABNORMAL HIGH (ref 0.7–4.0)
MCH: 17.3 pg — AB (ref 26.0–34.0)
MCHC: 25.2 g/dL — ABNORMAL LOW (ref 30.0–36.0)
MCV: 68.5 fL — ABNORMAL LOW (ref 78.0–100.0)
MONOS PCT: 7 % (ref 3–12)
Monocytes Absolute: 2.8 10*3/uL — ABNORMAL HIGH (ref 0.1–1.0)
Neutro Abs: 32.6 10*3/uL — ABNORMAL HIGH (ref 1.7–7.7)
Neutrophils Relative %: 81 % — ABNORMAL HIGH (ref 43–77)
PLATELETS: ADEQUATE 10*3/uL (ref 150–400)
RBC: 5.96 MIL/uL — AB (ref 4.22–5.81)
RDW: 19.8 % — ABNORMAL HIGH (ref 11.5–15.5)
WBC: 40.2 10*3/uL — AB (ref 4.0–10.5)

## 2014-02-22 LAB — URINALYSIS, ROUTINE W REFLEX MICROSCOPIC
GLUCOSE, UA: 100 mg/dL — AB
Hgb urine dipstick: NEGATIVE
KETONES UR: NEGATIVE mg/dL
NITRITE: POSITIVE — AB
Protein, ur: >300 mg/dL — AB
SPECIFIC GRAVITY, URINE: 1.022 (ref 1.005–1.030)
Urobilinogen, UA: 2 mg/dL — ABNORMAL HIGH (ref 0.0–1.0)
pH: 5.5 (ref 5.0–8.0)

## 2014-02-22 LAB — ABO/RH: ABO/RH(D): B POS

## 2014-02-22 LAB — URINE MICROSCOPIC-ADD ON

## 2014-02-22 LAB — MRSA PCR SCREENING: MRSA by PCR: NEGATIVE

## 2014-02-22 LAB — I-STAT CG4 LACTIC ACID, ED
Lactic Acid, Venous: 5.99 mmol/L — ABNORMAL HIGH (ref 0.5–2.2)
Lactic Acid, Venous: 2.29 mmol/L — ABNORMAL HIGH (ref 0.5–2.2)

## 2014-02-22 LAB — CLOSTRIDIUM DIFFICILE BY PCR: Toxigenic C. Difficile by PCR: NEGATIVE

## 2014-02-22 LAB — PROTIME-INR
INR: 1.28 (ref 0.00–1.49)
PROTHROMBIN TIME: 16.1 s — AB (ref 11.6–15.2)

## 2014-02-22 LAB — POC OCCULT BLOOD, ED: Fecal Occult Bld: POSITIVE — AB

## 2014-02-22 LAB — LIPASE, BLOOD: LIPASE: 69 U/L — AB (ref 11–59)

## 2014-02-22 MED ORDER — ACETAMINOPHEN 650 MG RE SUPP: 650.0000 mg | Freq: Four times a day (QID) | RECTAL | Status: DC | PRN

## 2014-02-22 MED ORDER — SODIUM CHLORIDE 0.9 % IJ SOLN
3.0000 mL | Freq: Two times a day (BID) | INTRAMUSCULAR | Status: DC
  Administered 2014-02-23 – 2014-02-28 (×4): 3 mL via INTRAVENOUS

## 2014-02-22 MED ORDER — SODIUM CHLORIDE 0.9 % IV SOLN
Freq: Once | INTRAVENOUS | Status: AC
  Administered 2014-02-22: 15:00:00 via INTRAVENOUS

## 2014-02-22 MED ORDER — METRONIDAZOLE IN NACL 5-0.79 MG/ML-% IV SOLN
500.0000 mg | Freq: Three times a day (TID) | INTRAVENOUS | Status: DC
  Administered 2014-02-22 – 2014-02-25 (×8): 500 mg via INTRAVENOUS
  Filled 2014-02-22 (×8): qty 100

## 2014-02-22 MED ORDER — ONDANSETRON HCL 4 MG PO TABS: 4.0000 mg | ORAL_TABLET | Freq: Four times a day (QID) | ORAL | Status: DC | PRN

## 2014-02-22 MED ORDER — METRONIDAZOLE IN NACL 5-0.79 MG/ML-% IV SOLN
500.0000 mg | Freq: Once | INTRAVENOUS | Status: DC
  Filled 2014-02-22: qty 100

## 2014-02-22 MED ORDER — ONDANSETRON HCL 4 MG/2ML IJ SOLN: 4.0000 mg | Freq: Four times a day (QID) | INTRAMUSCULAR | Status: DC | PRN

## 2014-02-22 MED ORDER — SODIUM CHLORIDE 0.9 % IV BOLUS (SEPSIS)
1000.0000 mL | Freq: Once | INTRAVENOUS | Status: AC
  Administered 2014-02-22: 1000 mL via INTRAVENOUS

## 2014-02-22 MED ORDER — PNEUMOCOCCAL VAC POLYVALENT 25 MCG/0.5ML IJ INJ
0.5000 mL | INJECTION | INTRAMUSCULAR | Status: AC
  Administered 2014-02-24: 0.5 mL via INTRAMUSCULAR
  Filled 2014-02-22 (×3): qty 0.5

## 2014-02-22 MED ORDER — PIPERACILLIN-TAZOBACTAM 3.375 G IVPB 30 MIN
3.3750 g | Freq: Once | INTRAVENOUS | Status: AC
  Administered 2014-02-22: 3.375 g via INTRAVENOUS
  Filled 2014-02-22: qty 50

## 2014-02-22 MED ORDER — ACETAMINOPHEN 325 MG PO TABS: 650.0000 mg | ORAL_TABLET | Freq: Four times a day (QID) | ORAL | Status: DC | PRN

## 2014-02-22 MED ORDER — SODIUM CHLORIDE 0.9 % IV SOLN
INTRAVENOUS | Status: DC
  Administered 2014-02-22: 125 mL via INTRAVENOUS
  Administered 2014-02-23 – 2014-02-26 (×9): via INTRAVENOUS

## 2014-02-22 MED ORDER — IOHEXOL 300 MG/ML  SOLN
100.0000 mL | Freq: Once | INTRAMUSCULAR | Status: AC | PRN
  Administered 2014-02-22: 100 mL via INTRAVENOUS

## 2014-02-22 MED ORDER — INFLUENZA VAC SPLIT QUAD 0.5 ML IM SUSY
0.5000 mL | PREFILLED_SYRINGE | INTRAMUSCULAR | Status: AC
  Administered 2014-02-24: 0.5 mL via INTRAMUSCULAR
  Filled 2014-02-22 (×3): qty 0.5

## 2014-02-22 MED ORDER — ALBUTEROL SULFATE (2.5 MG/3ML) 0.083% IN NEBU: 2.5000 mg | INHALATION_SOLUTION | RESPIRATORY_TRACT | Status: DC | PRN

## 2014-02-22 MED ORDER — PIPERACILLIN-TAZOBACTAM 3.375 G IVPB: 3.3750 g | Freq: Three times a day (TID) | INTRAVENOUS | Status: DC

## 2014-02-22 MED ORDER — OXYCODONE HCL 5 MG PO TABS
5.0000 mg | ORAL_TABLET | ORAL | Status: DC | PRN
  Administered 2014-02-23 – 2014-03-02 (×20): 5 mg via ORAL
  Filled 2014-02-22 (×20): qty 1

## 2014-02-22 MED ORDER — GUAIFENESIN-DM 100-10 MG/5ML PO SYRP: 5.0000 mL | ORAL_SOLUTION | ORAL | Status: DC | PRN

## 2014-02-22 MED ORDER — MORPHINE SULFATE 2 MG/ML IJ SOLN: 1.0000 mg | INTRAMUSCULAR | Status: DC | PRN

## 2014-02-22 NOTE — ED Notes (Addendum)
Per EMS pt complaint of constipation for a few weeks taking over the counter laxative; felt fine this am then sudden onset of diarrhea. Pt denies pain. Pt given 100 ml 0.9% NaCl en route.

## 2014-02-22 NOTE — Progress Notes (Signed)
  CARE MANAGEMENT ED NOTE 02/22/2014  Patient:  Frank [name], Frank [name]   Account Number:  [id number]  Date Initiated:  02/22/2014  Documentation initiated by:  Livia Snellen  Subjective/Objective Assessment:   Patient presents to Ed with diarrhea     Subjective/Objective Assessment Detail:   Patient with pmhx of hyperlipidemia, HTN, Ischemic heart disease, obesity.  WBC 40.2 lactic acid 5.99     Action/Plan:   Action/Plan Detail:   Anticipated DC Date:       Status Recommendation to Physician:   Result of Recommendation:    Other ED [place]  Other  PCP issues    Choice offered to / List presented to:            Status of service:  Completed, signed off  ED Comments:   ED Comments Detail:  EDCM spoke to patient at bedside.  Patient reports his pcp is Dr. Dahlia Client with cardiology.  Patient reportrs he saw his pcp about five in a half months ago.  Patient lives at home with his significant other Frank [name].  Patient currently does not have any home health services at this time.  Patient does not have any medical equipmment at home.  Patient reports he is able to perform his ADL's without difficulty usually.  No further EDCM needs at this time.

## 2014-02-22 NOTE — ED Provider Notes (Signed)
CSN: [id number]     Arrival date & time 02/22/14  1309 History   First MD Initiated Contact with Patient 02/22/14 1329     Chief Complaint  Patient presents with  . Diarrhea     (Consider location/radiation/quality/duration/timing/severity/associated sxs/prior Treatment) HPI Comments: Patient presents to the ER for evaluation of diarrhea. Patient reports that he has been feeling constipated over the last one or 2 days. He took a laxative at home. Around 10:30 this morning he started having watery diarrhea. Patient has had good control of diarrhea ever since. He reports feeling distended and diffusely cramping in his abdomen. He has not had any fever.  Patient's wife reports that the patient has had intermittent episodes similar to this over the past year. When he has the episodes of diarrhea he generally has blood in his stool. He has not seen a doctor for this. He has been blaming on his hemorrhoids.  Patient is a 68 y.o. male presenting with diarrhea.  Diarrhea Associated symptoms: abdominal pain     Past Medical History  Diagnosis Date  . Hyperlipidemia   . Hypertension   . Ischemic heart disease   . Obesity    Past Surgical History  Procedure Laterality Date  . Coronary artery bypass graft     Family History  Problem Relation Age of Onset  . Cancer Father 23  . Peripheral vascular disease Mother 76    deceased   History  Substance Use Topics  . Smoking status: Former Research scientist (life sciences)  . Smokeless tobacco: Not on file  . Alcohol Use: Yes     Comment: 2 to 6 beers a week    Review of Systems  Gastrointestinal: Positive for abdominal pain, diarrhea and blood in stool.  All other systems reviewed and are negative.     Allergies  Simvastatin  Home Medications   Prior to Admission medications   Medication Sig Start Date End Date Taking? Authorizing Provider  aspirin 325 MG tablet Take 325 mg by mouth daily.   Yes Historical Provider, MD  atenolol (TENORMIN) 25 MG  tablet Take 12.5 mg by mouth 2 (two) times daily.   Yes Historical Provider, MD  docusate sodium (COLACE) 100 MG capsule Take 200 mg by mouth daily.   Yes Historical Provider, MD  Multiple Vitamin (MULTIVITAMIN WITH MINERALS) TABS tablet Take 1 tablet by mouth daily.   Yes Historical Provider, MD  naproxen sodium (ANAPROX) 220 MG tablet Take 440 mg by mouth daily as needed (pain).    Yes Historical Provider, MD  psyllium (REGULOID) 0.52 G capsule Take 1.04 g by mouth daily.   Yes Historical Provider, MD  ramipril (ALTACE) 10 MG capsule Take 10 mg by mouth daily.   Yes Historical Provider, MD  tadalafil (CIALIS) 20 MG tablet Take 1 tablet (20 mg total) by mouth as needed. 02/15/13  Yes [name], MD  vitamin C (ASCORBIC ACID) 500 MG tablet Take 500 mg by mouth 2 (two) times daily.   Yes Historical Provider, MD   BP 88/62  Pulse 66  Temp(Src) 95.5 F (35.3 C) (Rectal)  Resp 24  SpO2 98% Physical Exam  Constitutional: He is oriented to person, place, and time. He appears well-developed and well-nourished. No distress.  HENT:  Head: Normocephalic and atraumatic.  Right Ear: Hearing normal.  Left Ear: Hearing normal.  Nose: Nose normal.  Mouth/Throat: Oropharynx is clear and moist and mucous membranes are normal.  Eyes: Conjunctivae and EOM are normal. Pupils are equal, round, and reactive  to light.  Neck: Normal range of motion. Neck supple.  Cardiovascular: Regular rhythm, S1 normal and S2 normal.  Exam reveals no gallop and no friction rub.   No murmur heard. Pulmonary/Chest: Effort normal and breath sounds normal. No respiratory distress. He exhibits no tenderness.  Abdominal: Soft. Normal appearance. He exhibits distension. Bowel sounds are decreased. There is no hepatosplenomegaly. There is generalized tenderness. There is no rebound, no guarding, no tenderness at McBurney's point and negative Murphy's sign. No hernia.  Musculoskeletal: Normal range of motion.  Neurological: He  is alert and oriented to person, place, and time. He has normal strength. No cranial nerve deficit or sensory deficit. Coordination normal. GCS eye subscore is 4. GCS verbal subscore is 5. GCS motor subscore is 6.  Skin: Skin is warm, dry and intact. No rash noted. No cyanosis.  Psychiatric: He has a normal mood and affect. His speech is normal and behavior is normal. Thought content normal.    ED Course  Procedures (including critical care time) Labs Review Labs Reviewed  CBC WITH DIFFERENTIAL - Abnormal; Notable for the following:    RBC 5.96 (*)    Hemoglobin 10.3 (*)    MCV 68.5 (*)    MCH 17.3 (*)    MCHC 25.2 (*)    RDW 19.8 (*)    All other components within normal limits  I-STAT CG4 LACTIC ACID, ED - Abnormal; Notable for the following:    Lactic Acid, Venous 5.99 (*)    All other components within normal limits  POC OCCULT BLOOD, ED - Abnormal; Notable for the following:    Fecal Occult Bld POSITIVE (*)    All other components within normal limits  CULTURE, BLOOD (ROUTINE X 2)  CULTURE, BLOOD (ROUTINE X 2)  URINE CULTURE  CLOSTRIDIUM DIFFICILE BY PCR  COMPREHENSIVE METABOLIC PANEL  LIPASE, BLOOD  URINALYSIS, ROUTINE W REFLEX MICROSCOPIC  TROPONIN I    Imaging Review No results found.   EKG Interpretation   Date/Time:  Thursday February 22 2014 13:51:54 EDT Ventricular Rate:  67 PR Interval:  183 QRS Duration: 108 QT Interval:  455 QTC Calculation: 480 R Axis:   -59 Text Interpretation:  Sinus rhythm Atrial premature complex Left anterior  fascicular block Abnormal R-wave progression, early transition Probable  left ventricular hypertrophy Anterior Q waves, possibly due to LVH No  significant change since last tracing Confirmed by POLLINA  MD,  [place] ([id number]) on 02/22/2014 2:38:08 PM      MDM   Final diagnoses:  None  sepsis  Patient presented to the ER for evaluation of diarrhea. He has had callus episodes of watery diarrhea here in the ER, now  there is evidence of bleeding. Patient complaining of distended abdomen with diffuse cramping pain. No signs of peritonitis on examination. Patient was hypotensive arrival. He has been given 2 L of normal saline solution with only some improvement in his blood pressure. Patient was mildly hypothermic upon arrival as well. This raises concern for the possibility of sepsis.  Patient administered 3 L of normal saline solution for his hypertension. Administered IV Zosyn for intra-abdominal source leading to sepsis. CT scan will be performed to further evaluate, signed out to Dr Naomie Dean to follow up and disposition (admit) patient.    Orpah Greek, MD 02/22/14 [id number]

## 2014-02-22 NOTE — ED Notes (Signed)
Bed: WA08 Expected date:  Expected time:  Means of arrival:  Comments: Diarrhea, Hypotension

## 2014-02-22 NOTE — ED Notes (Signed)
Frank [name] [phone]

## 2014-02-22 NOTE — ED Notes (Addendum)
Unsuccessful lab draw attempt x2 by this RN. Phelbotomy notified.  Upon arrival pt saturated with diarrhea. Complete gown and linen changed. Pt cleansed.

## 2014-02-22 NOTE — ED Notes (Signed)
Dr Betsey Holiday aware of elevated Lactic

## 2014-02-22 NOTE — ED Notes (Signed)
Wife at bedside reports husband/pt having bloody diarrhea intermittently over past year lasting 2-3 days at a time. Pollina MD notified.

## 2014-02-22 NOTE — Progress Notes (Signed)
Utilization Review completed.  Brodric Schauer RN CM  

## 2014-02-22 NOTE — H&P (Addendum)
PATIENT DETAILS Name: Frank [name] Age: 68 y.o. Sex: male Date of Birth: 1946-05-05 Admit Date: 02/22/2014 [id number] Brackbill, MD   CHIEF COMPLAINT:  Bloody diarrhea  HPI: Frank [name] is a 68 y.o. male with a Past Medical History of coronary artery disease status post CABG in 2001, hypertension, dyslipidemia who presents today with the above noted complaint. Per patient, for the past one week or so he has had 3-4 episodes of small loose stools, he feels that he has incomplete evacuation and hence has been taking laxatives for the past few days. This morning he developed profuse vomiting and profuse loose, watery stools that have been bloody. He was then brought to the emergency room for further evaluation and treatment, in the emergency room, he was found to have significantly elevated WBC, along with metabolic acidosis and a CT scan of the abdomen showed colitis with multiple liver masses. I was subsequently asked to admit this patient for further evaluation and treatment This patient does not have a primary care practitioner-he has not had a colonoscopy. Patient claims he has cut down on his carbs and has lost around 50 pounds in the past 2 years. He drinks socially but frequently-denies that he is an alcoholic. He denies any prior history of hepatitis C or hepatitis B. He claims that for the past one week or so he has been feeling bloated, and has noticed that his abdomen has gotten slightly distended. He is also noted development of new lower extremity edema bilaterally. There is no history of headache, chest pain, shortness of breath.   ALLERGIES:   Allergies  Allergen Reactions  . Simvastatin Other (See Comments)    "Muscle aches"    PAST MEDICAL HISTORY: Past Medical History  Diagnosis Date  . Hyperlipidemia   . Hypertension   . Ischemic heart disease   . Obesity     PAST SURGICAL HISTORY: Past Surgical History  Procedure Laterality Date  . Coronary  artery bypass graft      MEDICATIONS AT HOME: Prior to Admission medications   Medication Sig Start Date End Date Taking? Authorizing Provider  aspirin 325 MG tablet Take 325 mg by mouth daily.   Yes Historical Provider, MD  atenolol (TENORMIN) 25 MG tablet Take 12.5 mg by mouth 2 (two) times daily.   Yes Historical Provider, MD  docusate sodium (COLACE) 100 MG capsule Take 200 mg by mouth daily.   Yes Historical Provider, MD  Multiple Vitamin (MULTIVITAMIN WITH MINERALS) TABS tablet Take 1 tablet by mouth daily.   Yes Historical Provider, MD  naproxen sodium (ANAPROX) 220 MG tablet Take 440 mg by mouth daily as needed (pain).    Yes Historical Provider, MD  psyllium (REGULOID) 0.52 G capsule Take 1.04 g by mouth daily.   Yes Historical Provider, MD  ramipril (ALTACE) 10 MG capsule Take 10 mg by mouth daily.   Yes Historical Provider, MD  tadalafil (CIALIS) 20 MG tablet Take 1 tablet (20 mg total) by mouth as needed. 02/15/13  Yes [name], MD  vitamin C (ASCORBIC ACID) 500 MG tablet Take 500 mg by mouth 2 (two) times daily.   Yes Historical Provider, MD    FAMILY HISTORY: Family History  Problem Relation Age of Onset  . Cancer Father 21  . Peripheral vascular disease Mother 68    deceased   SOCIAL HISTORY:  reports that he has quit smoking. He does not have any smokeless tobacco history on file.  He reports that he drinks alcohol. He reports that he does not use illicit drugs.  REVIEW OF SYSTEMS:  Constitutional:   No Fevers, chills.  HEENT:    No headaches, Difficulty swallowing,Tooth/dental problems,Sore throat.    Cardio-vascular: No chest pain,  Orthopnea, PND,  anasarca,  dizziness, palpitations  GI:  No bdominal pain- but+ for discomfort/bloating  Resp: No shortness of breath with exertion or at rest.  No excess mucus, no productive cough, No non-productive cough,  No coughing up of blood.No change in color of mucus.No wheezing.  Skin:  no rash or  lesions.  GU:  no dysuria, change in color of urine, no urgency or frequency.  No flank pain.  Musculoskeletal: No joint pain or swelling.  No decreased range of motion.  No back pain.  Psych: No change in mood or affect. No depression or anxiety.  No memory loss.   PHYSICAL EXAM: Blood pressure 108/56, pulse 71, temperature 98 F (36.7 C), temperature source Oral, resp. rate 25, weight 98.884 kg (218 lb), SpO2 96.00%.  General appearance :Awake, alert, not in any distress. Speech Clear. Looks acutely sick.  Normocephalic, pupils equally reactive to light and accomodation Neck: supple, no JVD. No cervical lymphadenopathy.  Chest:Good air entry bilaterally, no added sounds  CVS: S1 S2 regular, no murmurs.  Abdomen: Bowel sounds present, Non tender, but slightly distended with no gaurding, rigidity or rebound. Extremities: B/L Lower Ext shows 2 +edema, both legs are warm to touch Neurology: Awake alert, and oriented X 3, CN II-XII intact, Non focal Skin:No Rash Wounds:N/A  LABS ON ADMISSION:   Recent Labs  02/22/14 1443  NA 143  K 4.6  CL 107  CO2 15*  GLUCOSE 143*  BUN 20  CREATININE 1.09  CALCIUM 8.9    Recent Labs  02/22/14 1443  AST 45*  ALT 27  ALKPHOS 256*  BILITOT 1.3*  PROT 6.6  ALBUMIN 3.0*    Recent Labs  02/22/14 1443  LIPASE 69*    Recent Labs  02/22/14 1443  WBC 40.2*  NEUTROABS 32.6*  HGB 10.3*  HCT 40.8  MCV 68.5*  PLT PLATELET CLUMPS NOTED ON SMEAR, COUNT APPEARS ADEQUATE    Recent Labs  02/22/14 1443  TROPONINI <0.30   No results found for this basename: DDIMER,  in the last 72 hours No components found with this basename: POCBNP,    RADIOLOGIC STUDIES ON ADMISSION: Dg Chest 1 View  02/22/2014   CLINICAL DATA:  Increasing shortness of breath, diarrhea. Hypertension, prior heart surgery.  EXAM: CHEST - 1 VIEW  COMPARISON:  Chest radiograph report dated September 05, 1999 though images are not available for direct comparison.   FINDINGS: The cardiac silhouette appears mildly enlarged, even with consideration to this low inspiratory portable examination with crowded vascular markings. Status post median sternotomy for CABG. Mild patchy LEFT lower lobe airspace opacity. The pleural effusions. No pneumothorax. Soft tissue planes and included osseous structures are nonsuspicious.  IMPRESSION: Mild patchy LEFT lower lobe airspace opacity could reflect atelectasis or even pneumonia. Recommend followup chest radiograph after treatment to verify improvement.  Mild cardiomegaly.   Electronically Signed   By: Elon Alas   On: 02/22/2014 17:38   Ct Abdomen Pelvis W Contrast  02/22/2014   CLINICAL DATA:  Patient presents to the ER for evaluation of diarrhea. Patient reports that he has been feeling constipated over the last one or 2 days. He took a laxative at home. Around 10:30 this morning he started having  watery diarrhea. Patient has had good control of diarrhea ever since. He reports feeling distended and diffusely cramping in his abdomen. He has not had any fever.  EXAM: CT ABDOMEN AND PELVIS WITH CONTRAST  TECHNIQUE: Multidetector CT imaging of the abdomen and pelvis was performed using the standard protocol following bolus administration of intravenous contrast.  CONTRAST:  133mL OMNIPAQUE IOHEXOL 300 MG/ML  SOLN  COMPARISON:  None.  FINDINGS: There are trace bilateral pleural effusions.  The liver is diminutive in size with nodular contour or consistent with cirrhosis. There are multiple hepatic masses with the largest in the right hepatic lobe measuring 5.8 x 6.8 cm. The largest mass in the left hepatic lobe measures 3.6 x 3.4 cm. There is a 17 mm cyst in the anterior left hepatic lobe. There is no intrahepatic or extrahepatic biliary ductal dilatation. There is cholelithiasis. The spleen demonstrates no focal abnormality. The kidneys, adrenal glands and pancreas are normal. The bladder is unremarkable.  There is distal transverse  colon and descending colon bowel wall thickening and surrounding inflammatory changes most concerning for colitis. There is a moderate amount of abdominal and free fluid. There is a fat containing left inguinal hernia. There is no pneumoperitoneum, pneumatosis, or portal venous gas. There is a mildly enlarged periportal lymph node measuring 12.5 mm.  The abdominal aorta is normal in caliber with atherosclerosis.  There are no lytic or sclerotic osseous lesions. There is severe degenerative disc disease at L5-S1 with a broad-based disc bulge and bilateral facet arthropathy. There are there is a right L5 pars interarticularis defect.  IMPRESSION: 1. 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 The overall appearance is most concerning for multifocal hepatocellular carcinoma versus intrahepatic metastasis arising from primary [facility] versus metastatic disease. 2. There is bowel wall thickening involving the distal transverse and descending colon concerning for colitis secondary to an infectious or inflammatory etiology. Recommend follow-up colonoscopy following resolution of patient's acute illness as colonic malignancy is in the differential diagnosis for the liver findings.   Electronically Signed   By: Kathreen Devoid   On: 02/22/2014 17:30     EKG: Independently reviewed. NSR  ASSESSMENT AND PLAN: Present on Admission:  . Sepsis: Secondary to colitis, treat with IV fluids, IV Zosyn/Flagyl. Await blood cultures, stool studies. Monitor in SDU.  . Colitis: Likely infectious. Empirically cover with both IV Zosyn/Flagyl.Await culture data/ stool studies. Taper accordingly.  . Ischemic heart disease- status post CABG: Currently without chest pain or shortness of breath. Hold ASA and Atenolol given bloody diarrhea and soft BP. Marland Kitchen Benign hypertensive heart disease without heart failure:BP soft, hold all anti-hypertensives . Pure hypercholesterolemia:not on statins secondary to myalgias.  . Liver masses:  Not sure this is primary hepatocellular cancer or metastases. Once sepsis/colitis stable, will likely need a liver biopsy. Family and patient were.Check AFP/CEA/HBaAG/HCVAb . Lower Ext Edema:multifactorial-secondary to acute illness/underlying malignancy-but has proteinuria. Recheck UA once acute illness is over-if still persistent could have membranous nephropathy from underlying malignancy.Echo ordered.  Further plan will depend as patient's clinical course evolves and further radiologic and laboratory data become available. Patient will be monitored closely.  Above noted plan was discussed with patient/girlfriend/daughter, they were in agreement.   DVT Prophylaxis: SCD's  Code Status: Full Code  Total time spent for admission equals 45 minutes.  [facility] Pager [phone]  If 7PM-7AM, please contact night-coverage [email or web address] Password [email or web address] 02/22/2014, 7:28 PM

## 2014-02-22 NOTE — ED Provider Notes (Signed)
6:10 PM patient feels much improved after treatment with intravenous fluids, antibiotics. He is alert talkative no distress abdomen soft nontender. He continues to have bloody diarrhea.. Nursing was unable to obtain repeat blood sample.  Procedure I performed phlebotomy from right femoral vein after area prepped with chlorhexidine. No resultant hematoma. Syringe with 21-gauge needle was used. No difficulty and no complications from procedure. Results for orders placed during the hospital encounter of 02/22/14  CLOSTRIDIUM DIFFICILE BY PCR      Result Value Ref Range   C difficile by pcr NEGATIVE  NEGATIVE  CBC WITH DIFFERENTIAL      Result Value Ref Range   WBC 40.2 (*) 4.0 - 10.5 K/uL   RBC 5.96 (*) 4.22 - 5.81 MIL/uL   Hemoglobin 10.3 (*) 13.0 - 17.0 g/dL   HCT 40.8  39.0 - 52.0 %   MCV 68.5 (*) 78.0 - 100.0 fL   MCH 17.3 (*) 26.0 - 34.0 pg   MCHC 25.2 (*) 30.0 - 36.0 g/dL   RDW 19.8 (*) 11.5 - 15.5 %   Platelets    150 - 400 K/uL   Value: PLATELET CLUMPS NOTED ON SMEAR, COUNT APPEARS ADEQUATE   Neutrophils Relative % 81 (*) 43 - 77 %   Lymphocytes Relative 12  12 - 46 %   Monocytes Relative 7  3 - 12 %   Eosinophils Relative 0  0 - 5 %   Basophils Relative 0  0 - 1 %   Neutro Abs 32.6 (*) 1.7 - 7.7 K/uL   Lymphs Abs 4.8 (*) 0.7 - 4.0 K/uL   Monocytes Absolute 2.8 (*) 0.1 - 1.0 K/uL   Eosinophils Absolute 0.0  0.0 - 0.7 K/uL   Basophils Absolute 0.0  0.0 - 0.1 K/uL   RBC Morphology POLYCHROMASIA PRESENT    COMPREHENSIVE METABOLIC PANEL      Result Value Ref Range   Sodium 143  137 - 147 mEq/L   Potassium 4.6  3.7 - 5.3 mEq/L   Chloride 107  96 - 112 mEq/L   CO2 15 (*) 19 - 32 mEq/L   Glucose, Bld 143 (*) 70 - 99 mg/dL   BUN 20  6 - 23 mg/dL   Creatinine, Ser 1.09  0.50 - 1.35 mg/dL   Calcium 8.9  8.4 - 10.5 mg/dL   Total Protein 6.6  6.0 - 8.3 g/dL   Albumin 3.0 (*) 3.5 - 5.2 g/dL   AST 45 (*) 0 - 37 U/L   ALT 27  0 - 53 U/L   Alkaline Phosphatase 256 (*) 39 - 117 U/L    Total Bilirubin 1.3 (*) 0.3 - 1.2 mg/dL   GFR calc non Af Amer 67 (*) >90 mL/min   GFR calc Af Amer 77 (*) >90 mL/min   Anion gap 21 (*) 5 - 15  LIPASE, BLOOD      Result Value Ref Range   Lipase 69 (*) 11 - 59 U/L  URINALYSIS, ROUTINE W REFLEX MICROSCOPIC      Result Value Ref Range   Color, Urine RED (*) YELLOW   APPearance TURBID (*) CLEAR   Specific Gravity, Urine 1.022  1.005 - 1.030   pH 5.5  5.0 - 8.0   Glucose, UA 100 (*) NEGATIVE mg/dL   Hgb urine dipstick NEGATIVE  NEGATIVE   Bilirubin Urine MODERATE (*) NEGATIVE   Ketones, ur NEGATIVE  NEGATIVE mg/dL   Protein, ur >300 (*) NEGATIVE mg/dL   Urobilinogen, UA 2.0 (*)  0.0 - 1.0 mg/dL   Nitrite POSITIVE (*) NEGATIVE   Leukocytes, UA SMALL (*) NEGATIVE  TROPONIN I      Result Value Ref Range   Troponin I <0.30  <0.30 ng/mL  URINE MICROSCOPIC-ADD ON      Result Value Ref Range   Squamous Epithelial / LPF RARE  RARE   WBC, UA 3-6  <3 WBC/hpf   RBC / HPF 0-2  <3 RBC/hpf   Bacteria, UA MANY (*) RARE   Casts HYALINE CASTS (*) NEGATIVE  I-STAT CG4 LACTIC ACID, ED      Result Value Ref Range   Lactic Acid, Venous 5.99 (*) 0.5 - 2.2 mmol/L  POC OCCULT BLOOD, ED      Result Value Ref Range   Fecal Occult Bld POSITIVE (*) NEGATIVE  I-STAT CG4 LACTIC ACID, ED      Result Value Ref Range   Lactic Acid, Venous 2.29 (*) 0.5 - 2.2 mmol/L   Dg Chest 1 View  02/22/2014   CLINICAL DATA:  Increasing shortness of breath, diarrhea. Hypertension, prior heart surgery.  EXAM: CHEST - 1 VIEW  COMPARISON:  Chest radiograph report dated September 05, 1999 though images are not available for direct comparison.  FINDINGS: The cardiac silhouette appears mildly enlarged, even with consideration to this low inspiratory portable examination with crowded vascular markings. Status post median sternotomy for CABG. Mild patchy LEFT lower lobe airspace opacity. The pleural effusions. No pneumothorax. Soft tissue planes and included osseous structures are  nonsuspicious.  IMPRESSION: Mild patchy LEFT lower lobe airspace opacity could reflect atelectasis or even pneumonia. Recommend followup chest radiograph after treatment to verify improvement.  Mild cardiomegaly.   Electronically Signed   By: Elon Alas   On: 02/22/2014 17:38   Ct Abdomen Pelvis W Contrast  02/22/2014   CLINICAL DATA:  Patient presents to the ER for evaluation of diarrhea. Patient reports that he has been feeling constipated over the last one or 2 days. He took a laxative at home. Around 10:30 this morning he started having watery diarrhea. Patient has had good control of diarrhea ever since. He reports feeling distended and diffusely cramping in his abdomen. He has not had any fever.  EXAM: CT ABDOMEN AND PELVIS WITH CONTRAST  TECHNIQUE: Multidetector CT imaging of the abdomen and pelvis was performed using the standard protocol following bolus administration of intravenous contrast.  CONTRAST:  118mL OMNIPAQUE IOHEXOL 300 MG/ML  SOLN  COMPARISON:  None.  FINDINGS: There are trace bilateral pleural effusions.  The liver is diminutive in size with nodular contour or consistent with cirrhosis. There are multiple hepatic masses with the largest in the right hepatic lobe measuring 5.8 x 6.8 cm. The largest mass in the left hepatic lobe measures 3.6 x 3.4 cm. There is a 17 mm cyst in the anterior left hepatic lobe. There is no intrahepatic or extrahepatic biliary ductal dilatation. There is cholelithiasis. The spleen demonstrates no focal abnormality. The kidneys, adrenal glands and pancreas are normal. The bladder is unremarkable.  There is distal transverse colon and descending colon bowel wall thickening and surrounding inflammatory changes most concerning for colitis. There is a moderate amount of abdominal and free fluid. There is a fat containing left inguinal hernia. There is no pneumoperitoneum, pneumatosis, or portal venous gas. There is a mildly enlarged periportal lymph node  measuring 12.5 mm.  The abdominal aorta is normal in caliber with atherosclerosis.  There are no lytic or sclerotic osseous lesions. There is severe degenerative  disc disease at L5-S1 with a broad-based disc bulge and bilateral facet arthropathy. There are there is a right L5 pars interarticularis defect.  IMPRESSION: 1. 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 The overall appearance is most concerning for multifocal hepatocellular carcinoma versus intrahepatic metastasis arising from primary [facility] versus metastatic disease. 2. There is bowel wall thickening involving the distal transverse and descending colon concerning for colitis secondary to an infectious or inflammatory etiology. Recommend follow-up colonoscopy following resolution of patient's acute illness as colonic malignancy is in the differential diagnosis for the liver findings.   Electronically Signed   By: Kathreen Devoid   On: 02/22/2014 17:30   Spoke with Dr. Sloan Leiter . plan admit stepdown bed  Orlie Dakin, MD 02/22/14 1842

## 2014-02-22 NOTE — ED Notes (Signed)
Lab reports need another/different stool sample for GI pathogen PCR.

## 2014-02-22 NOTE — ED Notes (Addendum)
EMS had 2nd bag of 0.9% NaCl running on pt arrival; 2nd bag complete at present time. Per Pollina run 0.9% NaCl at 125 ml/hr; notify him if systolic pressure goes below 60.

## 2014-02-22 NOTE — ED Notes (Signed)
Per lab need another/differenet stool sample for stool culture.

## 2014-02-22 NOTE — ED Notes (Signed)
Pt has had several loose diarrhea since arrival. Pt stool brown and loose on arrival; current stool brown, red, and loose.

## 2014-02-22 NOTE — ED Notes (Signed)
Bear hugger applied 

## 2014-02-22 NOTE — Progress Notes (Signed)
ANTIBIOTIC CONSULT NOTE - INITIAL  Pharmacy Consult for Zosyn Indication: intra-abdominal infection  Allergies  Allergen Reactions  . Simvastatin Other (See Comments)    "Muscle aches"    Patient Measurements:     Vital Signs: Temp: 98 F (36.7 C) (10/15 1610) Temp Source: Rectal (10/15 1610) BP: 80/48 mmHg (10/15 1615) Pulse Rate: 61 (10/15 1615) Intake/Output from previous day:   Intake/Output from this shift: Total I/O In: -  Out: 1000 [Stool:1000]  Labs:  Recent Labs  02/22/14 1443  WBC 40.2*  HGB 10.3*  PLT PLATELET CLUMPS NOTED ON SMEAR, COUNT APPEARS ADEQUATE  CREATININE 1.09   The CrCl is unknown because both a height and weight (above a minimum accepted value) are required for this calculation. No results found for this basename: VANCOTROUGH, VANCOPEAK, VANCORANDOM, GENTTROUGH, GENTPEAK, GENTRANDOM, TOBRATROUGH, TOBRAPEAK, TOBRARND, AMIKACINPEAK, AMIKACINTROU, AMIKACIN,  in the last 72 hours   Microbiology: No results found for this or any previous visit (from the past 720 hour(s)).  Medical History: Past Medical History  Diagnosis Date  . Hyperlipidemia   . Hypertension   . Ischemic heart disease   . Obesity     Assessment: 79 yoM with PMH of HTN, ischemic heart disease. Presented to ED with diarrhea, distended abdomen with diffuse cramping pain. Pharmacy consulted to dose Zosyn for intra-abdominal infection, possible sepsis.  Tmax: (95.5) AF WBCs: 40.2 Renal: SCr 1.02 CrCl 19ml/min N  10/15 blood x2: sent 10/15 urine: sent 10/15 stool:  10/15 c.diff PCR: sent  Goal of Therapy:  Appropriate antibiotic dosing for renal function; eradication of infection  Plan:  Start Zosyn 3.375g IV 8H (extended infusion over 4 hours) F/u cultures, renal function  Kizzie Furnish, PharmD Pager: [phone] 02/22/2014 4:41 PM

## 2014-02-23 ENCOUNTER — Encounter (HOSPITAL_COMMUNITY): Payer: Self-pay

## 2014-02-23 DIAGNOSIS — R197 Diarrhea, unspecified: Secondary | ICD-10-CM

## 2014-02-23 DIAGNOSIS — I517 Cardiomegaly: Secondary | ICD-10-CM

## 2014-02-23 LAB — COMPREHENSIVE METABOLIC PANEL
ALK PHOS: 202 U/L — AB (ref 39–117)
ALT: 87 U/L — ABNORMAL HIGH (ref 0–53)
ANION GAP: 15 (ref 5–15)
AST: 262 U/L — ABNORMAL HIGH (ref 0–37)
Albumin: 2.5 g/dL — ABNORMAL LOW (ref 3.5–5.2)
BUN: 25 mg/dL — AB (ref 6–23)
CO2: 18 mEq/L — ABNORMAL LOW (ref 19–32)
Calcium: 7.9 mg/dL — ABNORMAL LOW (ref 8.4–10.5)
Chloride: 104 mEq/L (ref 96–112)
Creatinine, Ser: 1.67 mg/dL — ABNORMAL HIGH (ref 0.50–1.35)
GFR, EST AFRICAN AMERICAN: 46 mL/min — AB (ref >90)
GFR, EST NON AFRICAN AMERICAN: 40 mL/min — AB (ref >90)
GLUCOSE: 105 mg/dL — AB (ref 70–99)
Potassium: 4.7 mEq/L (ref 3.7–5.3)
Sodium: 137 mEq/L (ref 137–147)
Total Bilirubin: 0.8 mg/dL (ref 0.3–1.2)
Total Protein: 5.3 g/dL — ABNORMAL LOW (ref 6.0–8.3)

## 2014-02-23 LAB — CBC WITH DIFFERENTIAL/PLATELET
BAND NEUTROPHILS: 1 % (ref 0–10)
BASOS ABS: 0 10*3/uL (ref 0.0–0.1)
BLASTS: 0 %
Basophils Relative: 0 % (ref 0–1)
Eosinophils Absolute: 0 10*3/uL (ref 0.0–0.7)
Eosinophils Relative: 0 % (ref 0–5)
HEMATOCRIT: 30.7 % — AB (ref 39.0–52.0)
HEMOGLOBIN: 8.1 g/dL — AB (ref 13.0–17.0)
Lymphocytes Relative: 0 % — ABNORMAL LOW (ref 12–46)
Lymphs Abs: 0 10*3/uL — ABNORMAL LOW (ref 0.7–4.0)
MCH: 17.4 pg — AB (ref 26.0–34.0)
MCHC: 26.4 g/dL — ABNORMAL LOW (ref 30.0–36.0)
MCV: 66 fL — ABNORMAL LOW (ref 78.0–100.0)
MYELOCYTES: 0 %
Metamyelocytes Relative: 0 %
Monocytes Absolute: 9.5 10*3/uL — ABNORMAL HIGH (ref 0.1–1.0)
Monocytes Relative: 21 % — ABNORMAL HIGH (ref 3–12)
Neutro Abs: 35.8 10*3/uL — ABNORMAL HIGH (ref 1.7–7.7)
Neutrophils Relative %: 78 % — ABNORMAL HIGH (ref 43–77)
PROMYELOCYTES ABS: 0 %
Platelets: 223 10*3/uL (ref 150–400)
RBC: 4.65 MIL/uL (ref 4.22–5.81)
RDW: 19.8 % — AB (ref 11.5–15.5)
WBC: 45.3 10*3/uL — ABNORMAL HIGH (ref 4.0–10.5)
nRBC: 0 /100 WBC

## 2014-02-23 LAB — CBC
HEMATOCRIT: 30.5 % — AB (ref 39.0–52.0)
HEMOGLOBIN: 7.9 g/dL — AB (ref 13.0–17.0)
MCH: 16.8 pg — ABNORMAL LOW (ref 26.0–34.0)
MCHC: 25.9 g/dL — AB (ref 30.0–36.0)
MCV: 65 fL — ABNORMAL LOW (ref 78.0–100.0)
Platelets: 173 10*3/uL (ref 150–400)
RBC: 4.69 MIL/uL (ref 4.22–5.81)
RDW: 19.5 % — ABNORMAL HIGH (ref 11.5–15.5)
WBC: 29.1 10*3/uL — ABNORMAL HIGH (ref 4.0–10.5)

## 2014-02-23 LAB — LACTIC ACID, PLASMA: Lactic Acid, Venous: 1.5 mmol/L (ref 0.5–2.2)

## 2014-02-23 LAB — AFP TUMOR MARKER: AFP-Tumor Marker: 3.8 ng/mL (ref <6.1)

## 2014-02-23 LAB — CEA: CEA: 3790.2 ng/mL — ABNORMAL HIGH (ref 0.0–5.0)

## 2014-02-23 LAB — HEPATITIS B SURFACE ANTIGEN: Hepatitis B Surface Ag: NEGATIVE

## 2014-02-23 LAB — HEPATITIS C ANTIBODY: HCV Ab: NEGATIVE

## 2014-02-23 MED ORDER — PIPERACILLIN-TAZOBACTAM 3.375 G IVPB
3.3750 g | Freq: Three times a day (TID) | INTRAVENOUS | Status: DC
  Administered 2014-02-23 – 2014-03-02 (×21): 3.375 g via INTRAVENOUS
  Filled 2014-02-23 (×22): qty 50

## 2014-02-23 MED ORDER — PIPERACILLIN-TAZOBACTAM 3.375 G IVPB
3.3750 g | INTRAVENOUS | Status: AC
  Administered 2014-02-23: 3.375 g via INTRAVENOUS
  Filled 2014-02-23: qty 50

## 2014-02-23 NOTE — Progress Notes (Signed)
[facility] PROGRESS NOTE  KAMDIN FOLLETT RPR:[id number] DOB: Sep 16, 1945 DOA: 02/22/2014 PCP: [name], MD  Assessment/Plan: Sepsis - Resolving on current antibiotic regimen - The patient was hypotensive less than 24 hours ago - Also patient had elevation in serum creatinine which could be secondary to sepsis related hypotension - Continue to monitor in step down unit    Colitis - Most likely cause of principal problem. - Continue to monitor white blood cell count which is currently trending down. - Continue current antibiotic regimen Flagyl and Zosyn  Active Problems:   Ischemic heart disease - Stable no chest pain reported - Troponin levels reviewed and on last check within normal limits    Pure hypercholesterolemia   Benign hypertensive heart disease without heart failure - Will continue to monitor blood pressures as mentioned above patient recently hypotensive as such will hold any antihypertensive medications the patient may have been on prior to admission      Liver masses - Once there is improvement in condition will coordinate care regarding workup. Patient most likely will require biopsy. We'll plan on discussing with oncology once patient condition improve  Code Status: Full Family Communication: Discussed with patient and family members Disposition Plan: Step down   Consultants:  None  Procedures:  CT of abdomen and pelvis with contrast  Antibiotics:  Metronidazole and Zosyn  HPI/Subjective: The patient reports feeling better today. No new complaints.  Objective: Filed Vitals:   02/23/14 1208  BP: 185/60  Pulse:   Temp: 98.5 F (36.9 C)  Resp:     Intake/Output Summary (Last 24 hours) at 02/23/14 1252 Last data filed at 02/23/14 0550  Gross per 24 hour  Intake    390 ml  Output   1502 ml  Net  -1112 ml   Filed Weights   02/22/14 1728 02/22/14 2100 02/23/14 0403  Weight: 98.884 kg (218 lb) 102.2 kg (225 lb 5 oz) 103.1 kg (227  lb 4.7 oz)    Exam:   General:  Patient in no acute distress, alert and awake  Cardiovascular: Regular rate and rhythm, no murmurs or  Respiratory: Clear to auscultation bilaterally, no wheezes  Abdomen: Soft, nondistended, generalized tenderness with deep palpation, no rebound tenderness  Musculoskeletal: No cyanosis or clubbing  Data Reviewed: Basic Metabolic Panel:  Recent Labs Lab 02/22/14 1443 02/23/14 0330  NA 143 137  K 4.6 4.7  CL 107 104  CO2 15* 18*  GLUCOSE 143* 105*  BUN 20 25*  CREATININE 1.09 1.67*  CALCIUM 8.9 7.9*   Liver Function Tests:  Recent Labs Lab 02/22/14 1443 02/23/14 0330  AST 45* 262*  ALT 27 87*  ALKPHOS 256* 202*  BILITOT 1.3* 0.8  PROT 6.6 5.3*  ALBUMIN 3.0* 2.5*    Recent Labs Lab 02/22/14 1443  LIPASE 69*   No results found for this basename: AMMONIA,  in the last 168 hours CBC:  Recent Labs Lab 02/22/14 1443 02/22/14 1811 02/23/14 0330  WBC 40.2* 45.3* 29.1*  NEUTROABS 32.6* 35.8*  --   HGB 10.3* 8.1* 7.9*  HCT 40.8 30.7* 30.5*  MCV 68.5* 66.0* 65.0*  PLT PLATELET CLUMPS NOTED ON SMEAR, COUNT APPEARS ADEQUATE 223 173   Cardiac Enzymes:  Recent Labs Lab 02/22/14 1443  TROPONINI <0.30   BNP (last 3 results) No results found for this basename: PROBNP,  in the last 8760 hours CBG: No results found for this basename: GLUCAP,  in the last 168 hours  Recent Results (from the past 240  hour(s))  CLOSTRIDIUM DIFFICILE BY PCR     Status: None   Collection Time    02/22/14  1:54 PM      Result Value Ref Range Status   C difficile by pcr NEGATIVE  NEGATIVE Final   Comment: Performed at [facility], BLOOD (ROUTINE X 2)     Status: None   Collection Time    02/22/14  2:43 PM      Result Value Ref Range Status   Specimen Description BLOOD RIGHT HAND   Final   Special Requests BOTTLES DRAWN AEROBIC ONLY 1.5ML   Final   Culture  Setup Time     Final   Value: 02/22/2014 17:03     Performed at  [facility]   Culture     Final   Value:        BLOOD CULTURE RECEIVED NO GROWTH TO DATE CULTURE WILL BE HELD FOR 5 DAYS BEFORE ISSUING A FINAL NEGATIVE REPORT     Performed at [facility]   Report Status PENDING   Incomplete  CULTURE, BLOOD (ROUTINE X 2)     Status: None   Collection Time    02/22/14  2:43 PM      Result Value Ref Range Status   Specimen Description BLOOD RIGHT HAND   Final   Special Requests BOTTLES DRAWN AEROBIC ONLY 1.5ML   Final   Culture  Setup Time     Final   Value: 02/22/2014 17:04     Performed at [facility]   Culture     Final   Value:        BLOOD CULTURE RECEIVED NO GROWTH TO DATE CULTURE WILL BE HELD FOR 5 DAYS BEFORE ISSUING A FINAL NEGATIVE REPORT     Performed at [facility]   Report Status PENDING   Incomplete  MRSA PCR SCREENING     Status: None   Collection Time    02/22/14  9:53 PM      Result Value Ref Range Status   MRSA by PCR NEGATIVE  NEGATIVE Final   Comment:            The GeneXpert MRSA Assay (FDA     approved for NASAL specimens     only), is one component of a     comprehensive MRSA colonization     surveillance program. It is not     intended to diagnose MRSA     infection nor to guide or     monitor treatment for     MRSA infections.     Studies: Dg Chest 1 View  02/22/2014   CLINICAL DATA:  Increasing shortness of breath, diarrhea. Hypertension, prior heart surgery.  EXAM: CHEST - 1 VIEW  COMPARISON:  Chest radiograph report dated September 05, 1999 though images are not available for direct comparison.  FINDINGS: The cardiac silhouette appears mildly enlarged, even with consideration to this low inspiratory portable examination with crowded vascular markings. Status post median sternotomy for CABG. Mild patchy LEFT lower lobe airspace opacity. The pleural effusions. No pneumothorax. Soft tissue planes and included osseous structures are nonsuspicious.  IMPRESSION: Mild patchy LEFT lower lobe  airspace opacity could reflect atelectasis or even pneumonia. Recommend followup chest radiograph after treatment to verify improvement.  Mild cardiomegaly.   Electronically Signed   By: Elon Alas   On: 02/22/2014 17:38   Ct Abdomen Pelvis W Contrast  02/22/2014   CLINICAL DATA:  Patient  presents to the ER for evaluation of diarrhea. Patient reports that he has been feeling constipated over the last one or 2 days. He took a laxative at home. Around 10:30 this morning he started having watery diarrhea. Patient has had good control of diarrhea ever since. He reports feeling distended and diffusely cramping in his abdomen. He has not had any fever.  EXAM: CT ABDOMEN AND PELVIS WITH CONTRAST  TECHNIQUE: Multidetector CT imaging of the abdomen and pelvis was performed using the standard protocol following bolus administration of intravenous contrast.  CONTRAST:  138mL OMNIPAQUE IOHEXOL 300 MG/ML  SOLN  COMPARISON:  None.  FINDINGS: There are trace bilateral pleural effusions.  The liver is diminutive in size with nodular contour or consistent with cirrhosis. There are multiple hepatic masses with the largest in the right hepatic lobe measuring 5.8 x 6.8 cm. The largest mass in the left hepatic lobe measures 3.6 x 3.4 cm. There is a 17 mm cyst in the anterior left hepatic lobe. There is no intrahepatic or extrahepatic biliary ductal dilatation. There is cholelithiasis. The spleen demonstrates no focal abnormality. The kidneys, adrenal glands and pancreas are normal. The bladder is unremarkable.  There is distal transverse colon and descending colon bowel wall thickening and surrounding inflammatory changes most concerning for colitis. There is a moderate amount of abdominal and free fluid. There is a fat containing left inguinal hernia. There is no pneumoperitoneum, pneumatosis, or portal venous gas. There is a mildly enlarged periportal lymph node measuring 12.5 mm.  The abdominal aorta is normal in caliber  with atherosclerosis.  There are no lytic or sclerotic osseous lesions. There is severe degenerative disc disease at L5-S1 with a broad-based disc bulge and bilateral facet arthropathy. There are there is a right L5 pars interarticularis defect.  IMPRESSION: 1. 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 The overall appearance is most concerning for multifocal hepatocellular carcinoma versus intrahepatic metastasis arising from primary [facility] versus metastatic disease. 2. There is bowel wall thickening involving the distal transverse and descending colon concerning for colitis secondary to an infectious or inflammatory etiology. Recommend follow-up colonoscopy following resolution of patient's acute illness as colonic malignancy is in the differential diagnosis for the liver findings.   Electronically Signed   By: Kathreen Devoid   On: 02/22/2014 17:30    Scheduled Meds: . Influenza vac split quadrivalent PF  0.5 mL Intramuscular Tomorrow-1000  . metronidazole  500 mg Intravenous Q8H  . piperacillin-tazobactam (ZOSYN)  IV  3.375 g Intravenous Q8H  . piperacillin-tazobactam (ZOSYN)  IV  3.375 g Intravenous NOW  . pneumococcal 23 valent vaccine  0.5 mL Intramuscular Tomorrow-1000  . sodium chloride  3 mL Intravenous Q12H   Continuous Infusions: . sodium chloride 125 mL/hr at 02/23/14 0934    Time spent: > 35 minutes    Velvet Bathe  [facility] Pager [phone] If 7PM-7AM, please contact night-coverage at [email or web address], password [facility] 02/23/2014, 12:52 PM  LOS: 1 day

## 2014-02-23 NOTE — Progress Notes (Signed)
Nutrition Brief Note  Patient identified on the Malnutrition Screening Tool (MST) Report  Wt Readings from Last 15 Encounters:  02/23/14 227 lb 4.7 oz (103.1 kg)  09/13/13 231 lb 6.4 oz (104.962 kg)  02/15/13 252 lb (114.306 kg)    Body mass index is 33.55 kg/(m^2). Patient meets criteria for Obesity II based on current BMI.   Current diet order is Soft, patient had ordered breakfast tray and was waiting for it to be delivered upon RD visit. Labs and medications reviewed.   Pt reported an intentional wt loss of 50 lbs over past 1.5-2 years (18% body weight loss, non-severe for time frame) Had been cutting out sweets, sodas, and sweet tea from diet. Pt has also been increasing daily physical activity to assist with weight loss. No additional questions regarding weight loss at this time.  No nutrition interventions warranted at this time. If nutrition issues arise, please consult RD.   Atlee Abide MS RD LDN Clinical Dietitian [phone]

## 2014-02-23 NOTE — Progress Notes (Signed)
Echocardiogram 2D Echocardiogram has been performed.  Frank [name] 02/23/2014, 10:55 AM

## 2014-02-24 DIAGNOSIS — A419 Sepsis, unspecified organism: Secondary | ICD-10-CM | POA: Diagnosis not present

## 2014-02-24 LAB — GI PATHOGEN PANEL BY PCR, STOOL
C difficile toxin A/B: NEGATIVE
Campylobacter by PCR: NEGATIVE
Cryptosporidium by PCR: NEGATIVE
E COLI (STEC): NEGATIVE
E coli (ETEC) LT/ST: NEGATIVE
E coli 0157 by PCR: NEGATIVE
G lamblia by PCR: NEGATIVE
NOROVIRUS G1/G2: NEGATIVE
Rotavirus A by PCR: NEGATIVE
SHIGELLA BY PCR: NEGATIVE
Salmonella by PCR: NEGATIVE

## 2014-02-24 LAB — URINE CULTURE

## 2014-02-24 LAB — BASIC METABOLIC PANEL
Anion gap: 16 — ABNORMAL HIGH (ref 5–15)
BUN: 30 mg/dL — AB (ref 6–23)
CHLORIDE: 103 meq/L (ref 96–112)
CO2: 17 mEq/L — ABNORMAL LOW (ref 19–32)
CREATININE: 1.76 mg/dL — AB (ref 0.50–1.35)
Calcium: 7.8 mg/dL — ABNORMAL LOW (ref 8.4–10.5)
GFR calc Af Amer: 43 mL/min — ABNORMAL LOW (ref >90)
GFR calc non Af Amer: 37 mL/min — ABNORMAL LOW (ref >90)
GLUCOSE: 128 mg/dL — AB (ref 70–99)
Potassium: 3.7 mEq/L (ref 3.7–5.3)
Sodium: 136 mEq/L — ABNORMAL LOW (ref 137–147)

## 2014-02-24 LAB — CBC
HEMATOCRIT: 27 % — AB (ref 39.0–52.0)
HEMOGLOBIN: 7.3 g/dL — AB (ref 13.0–17.0)
MCH: 17.5 pg — ABNORMAL LOW (ref 26.0–34.0)
MCHC: 27 g/dL — ABNORMAL LOW (ref 30.0–36.0)
MCV: 64.9 fL — AB (ref 78.0–100.0)
Platelets: 134 10*3/uL — ABNORMAL LOW (ref 150–400)
RBC: 4.16 MIL/uL — AB (ref 4.22–5.81)
RDW: 19.5 % — ABNORMAL HIGH (ref 11.5–15.5)
WBC: 40.5 10*3/uL — AB (ref 4.0–10.5)

## 2014-02-24 NOTE — Progress Notes (Signed)
[facility] PROGRESS NOTE  Frank [name] KZS:[id number] DOB: 16-Aug-1945 DOA: 02/22/2014 PCP: [name], MD  Assessment/Plan: Sepsis - Resolving on current antibiotic regimen - The patient was hypotensive less than 24 hours ago - Also patient had elevation in serum creatinine which could be secondary to sepsis related hypotension - Continue to monitor in step down unit    Colitis - Most likely cause of principal problem. - Continue to monitor wbc levels.  WBC at 40.5 - Continue current antibiotic regimen Flagyl and Zosyn  Active Problems:   Ischemic heart disease - Stable no chest pain reported today - Troponin levels reviewed and on last check within normal limits    Pure hypercholesterolemia   Benign hypertensive heart disease without heart failure - Will continue to monitor blood pressures as mentioned above patient recently hypotensive as such will hold any antihypertensive medications the patient may have been on prior to admission      Liver masses - Will contact oncology on Monday for further evaluation and recommendations. - obtain afp levels  Code Status: Full Family Communication: Discussed with patient and family members Disposition Plan: Step down   Consultants:  None  Procedures:  CT of abdomen and pelvis with contrast  Antibiotics:  Metronidazole and Zosyn  HPI/Subjective: The patient reports feeling better today. No new complaints.  Objective: Filed Vitals:   02/24/14 1552  BP:   Pulse:   Temp: 98.2 F (36.8 C)  Resp:     Intake/Output Summary (Last 24 hours) at 02/24/14 1607 Last data filed at 02/24/14 1400  Gross per 24 hour  Intake   2955 ml  Output      0 ml  Net   2955 ml   Filed Weights   02/22/14 1728 02/22/14 2100 02/23/14 0403  Weight: 98.884 kg (218 lb) 102.2 kg (225 lb 5 oz) 103.1 kg (227 lb 4.7 oz)    Exam:   General:  Patient in no acute distress, alert and awake  Cardiovascular: Regular rate and  rhythm, no murmurs or  Respiratory: Clear to auscultation bilaterally, no wheezes  Abdomen: Soft, mildly distended, generalized tenderness with deep palpation, no rebound tenderness  Musculoskeletal: No cyanosis or clubbing  Data Reviewed: Basic Metabolic Panel:  Recent Labs Lab 02/22/14 1443 02/23/14 0330 02/24/14 1049  NA 143 137 136*  K 4.6 4.7 3.7  CL 107 104 103  CO2 15* 18* 17*  GLUCOSE 143* 105* 128*  BUN 20 25* 30*  CREATININE 1.09 1.67* 1.76*  CALCIUM 8.9 7.9* 7.8*   Liver Function Tests:  Recent Labs Lab 02/22/14 1443 02/23/14 0330  AST 45* 262*  ALT 27 87*  ALKPHOS 256* 202*  BILITOT 1.3* 0.8  PROT 6.6 5.3*  ALBUMIN 3.0* 2.5*    Recent Labs Lab 02/22/14 1443  LIPASE 69*   No results found for this basename: AMMONIA,  in the last 168 hours CBC:  Recent Labs Lab 02/22/14 1443 02/22/14 1811 02/23/14 0330 02/24/14 1049  WBC 40.2* 45.3* 29.1* 40.5*  NEUTROABS 32.6* 35.8*  --   --   HGB 10.3* 8.1* 7.9* 7.3*  HCT 40.8 30.7* 30.5* 27.0*  MCV 68.5* 66.0* 65.0* 64.9*  PLT PLATELET CLUMPS NOTED ON SMEAR, COUNT APPEARS ADEQUATE 223 173 134*   Cardiac Enzymes:  Recent Labs Lab 02/22/14 1443  TROPONINI <0.30   BNP (last 3 results) No results found for this basename: PROBNP,  in the last 8760 hours CBG: No results found for this basename: GLUCAP,  in the last  168 hours  Recent Results (from the past 240 hour(s))  CLOSTRIDIUM DIFFICILE BY PCR     Status: None   Collection Time    02/22/14  1:54 PM      Result Value Ref Range Status   C difficile by pcr NEGATIVE  NEGATIVE Final   Comment: Performed at [place], BLOOD (ROUTINE X 2)     Status: None   Collection Time    02/22/14  2:43 PM      Result Value Ref Range Status   Specimen Description BLOOD RIGHT HAND   Final   Special Requests BOTTLES DRAWN AEROBIC ONLY 1.5ML   Final   Culture  Setup Time     Final   Value: 02/22/2014 17:03     Performed at [facility]   Culture     Final   Value:        BLOOD CULTURE RECEIVED NO GROWTH TO DATE CULTURE WILL BE HELD FOR 5 DAYS BEFORE ISSUING A FINAL NEGATIVE REPORT     Performed at [facility]   Report Status PENDING   Incomplete  CULTURE, BLOOD (ROUTINE X 2)     Status: None   Collection Time    02/22/14  2:43 PM      Result Value Ref Range Status   Specimen Description BLOOD RIGHT HAND   Final   Special Requests BOTTLES DRAWN AEROBIC ONLY 1.5ML   Final   Culture  Setup Time     Final   Value: 02/22/2014 17:04     Performed at [facility]   Culture     Final   Value:        BLOOD CULTURE RECEIVED NO GROWTH TO DATE CULTURE WILL BE HELD FOR 5 DAYS BEFORE ISSUING A FINAL NEGATIVE REPORT     Performed at [facility]   Report Status PENDING   Incomplete  URINE CULTURE     Status: None   Collection Time    02/22/14  4:58 PM      Result Value Ref Range Status   Specimen Description URINE, CLEAN CATCH   Final   Special Requests NONE   Final   Culture  Setup Time     Final   Value: 02/23/2014 00:07     Performed at [place]     Final   Value: 50,000 COLONIES/ML     Performed at [facility]   Culture     Final   Value: LACTOBACILLUS SPECIES     Note: Standardized susceptibility testing for this organism is not available.     Performed at [facility]   Report Status 02/24/2014 FINAL   Final  STOOL CULTURE     Status: None   Collection Time    02/22/14  6:10 PM      Result Value Ref Range Status   Specimen Description PERIRECTAL   Final   Special Requests NONE   Final   Culture     Final   Value: NO SUSPICIOUS COLONIES, CONTINUING TO HOLD     Performed at [facility]   Report Status PENDING   Incomplete  MRSA PCR SCREENING     Status: None   Collection Time    02/22/14  9:53 PM      Result Value Ref Range Status   MRSA by PCR NEGATIVE  NEGATIVE Final   Comment:  The GeneXpert MRSA Assay (FDA      approved for NASAL specimens     only), is one component of a     comprehensive MRSA colonization     surveillance program. It is not     intended to diagnose MRSA     infection nor to guide or     monitor treatment for     MRSA infections.     Studies: Dg Chest 1 View  02/22/2014   CLINICAL DATA:  Increasing shortness of breath, diarrhea. Hypertension, prior heart surgery.  EXAM: CHEST - 1 VIEW  COMPARISON:  Chest radiograph report dated September 05, 1999 though images are not available for direct comparison.  FINDINGS: The cardiac silhouette appears mildly enlarged, even with consideration to this low inspiratory portable examination with crowded vascular markings. Status post median sternotomy for CABG. Mild patchy LEFT lower lobe airspace opacity. The pleural effusions. No pneumothorax. Soft tissue planes and included osseous structures are nonsuspicious.  IMPRESSION: Mild patchy LEFT lower lobe airspace opacity could reflect atelectasis or even pneumonia. Recommend followup chest radiograph after treatment to verify improvement.  Mild cardiomegaly.   Electronically Signed   By: Elon Alas   On: 02/22/2014 17:38   Ct Abdomen Pelvis W Contrast  02/22/2014   CLINICAL DATA:  Patient presents to the ER for evaluation of diarrhea. Patient reports that he has been feeling constipated over the last one or 2 days. He took a laxative at home. Around 10:30 this morning he started having watery diarrhea. Patient has had good control of diarrhea ever since. He reports feeling distended and diffusely cramping in his abdomen. He has not had any fever.  EXAM: CT ABDOMEN AND PELVIS WITH CONTRAST  TECHNIQUE: Multidetector CT imaging of the abdomen and pelvis was performed using the standard protocol following bolus administration of intravenous contrast.  CONTRAST:  127mL OMNIPAQUE IOHEXOL 300 MG/ML  SOLN  COMPARISON:  None.  FINDINGS: There are trace bilateral pleural effusions.  The liver is diminutive  in size with nodular contour or consistent with cirrhosis. There are multiple hepatic masses with the largest in the right hepatic lobe measuring 5.8 x 6.8 cm. The largest mass in the left hepatic lobe measures 3.6 x 3.4 cm. There is a 17 mm cyst in the anterior left hepatic lobe. There is no intrahepatic or extrahepatic biliary ductal dilatation. There is cholelithiasis. The spleen demonstrates no focal abnormality. The kidneys, adrenal glands and pancreas are normal. The bladder is unremarkable.  There is distal transverse colon and descending colon bowel wall thickening and surrounding inflammatory changes most concerning for colitis. There is a moderate amount of abdominal and free fluid. There is a fat containing left inguinal hernia. There is no pneumoperitoneum, pneumatosis, or portal venous gas. There is a mildly enlarged periportal lymph node measuring 12.5 mm.  The abdominal aorta is normal in caliber with atherosclerosis.  There are no lytic or sclerotic osseous lesions. There is severe degenerative disc disease at L5-S1 with a broad-based disc bulge and bilateral facet arthropathy. There are there is a right L5 pars interarticularis defect.  IMPRESSION: 1. 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 The overall appearance is most concerning for multifocal hepatocellular carcinoma versus intrahepatic metastasis arising from primary [facility] versus metastatic disease. 2. There is bowel wall thickening involving the distal transverse and descending colon concerning for colitis secondary to an infectious or inflammatory etiology. Recommend follow-up colonoscopy following resolution of patient's acute illness as colonic malignancy is in  the differential diagnosis for the liver findings.   Electronically Signed   By: Kathreen Devoid   On: 02/22/2014 17:30    Scheduled Meds: . metronidazole  500 mg Intravenous Q8H  . piperacillin-tazobactam (ZOSYN)  IV  3.375 g Intravenous Q8H  . sodium chloride  3  mL Intravenous Q12H   Continuous Infusions: . sodium chloride 125 mL/hr at 02/24/14 1033    Time spent: > 35 minutes    Velvet Bathe  [facility] Pager [phone] If 7PM-7AM, please contact night-coverage at [email or web address], password [facility] 02/24/2014, 4:07 PM  LOS: 2 days

## 2014-02-25 LAB — PREPARE RBC (CROSSMATCH)

## 2014-02-25 LAB — BASIC METABOLIC PANEL
Anion gap: 15 (ref 5–15)
BUN: 29 mg/dL — ABNORMAL HIGH (ref 6–23)
CALCIUM: 8 mg/dL — AB (ref 8.4–10.5)
CO2: 17 mEq/L — ABNORMAL LOW (ref 19–32)
Chloride: 105 mEq/L (ref 96–112)
Creatinine, Ser: 1.56 mg/dL — ABNORMAL HIGH (ref 0.50–1.35)
GFR calc Af Amer: 50 mL/min — ABNORMAL LOW (ref >90)
GFR calc non Af Amer: 43 mL/min — ABNORMAL LOW (ref >90)
GLUCOSE: 103 mg/dL — AB (ref 70–99)
Potassium: 3.8 mEq/L (ref 3.7–5.3)
Sodium: 137 mEq/L (ref 137–147)

## 2014-02-25 MED ORDER — ATENOLOL 12.5 MG HALF TABLET
12.5000 mg | ORAL_TABLET | Freq: Two times a day (BID) | ORAL | Status: DC
  Administered 2014-02-25 – 2014-02-28 (×7): 12.5 mg via ORAL
  Filled 2014-02-25 (×8): qty 1

## 2014-02-25 MED ORDER — SODIUM CHLORIDE 0.9 % IV SOLN
Freq: Once | INTRAVENOUS | Status: AC
  Administered 2014-02-25: 10:00:00 via INTRAVENOUS

## 2014-02-25 NOTE — Progress Notes (Signed)
[facility] PROGRESS NOTE  JYAIRE KOUDELKA QVZ:[id number] DOB: 26-Jan-1946 DOA: 02/22/2014 PCP: [name], MD  Assessment/Plan: Sepsis - Resolving on current antibiotic regimen - Hypotension resolved - Also patient had elevation in serum creatinine which could be secondary to sepsis related hypotension - Transfer to Step down unit for further evaluation and recommendations.  - Continue Zosyn, flagyl d/c'd after c diff was found to be negative.    Colitis - Most likely cause of principal problem. - Continue to monitor wbc levels. Currently trending down from 40 to 29 - Continue current antibiotic regimen of Zosyn  Active Problems:   Ischemic heart disease - Stable no chest pain reported today - Troponin levels reviewed and on last check within normal limits  Acute renal failure - And context of patient who presented with sepsis and hypotension. - Serum creatinine currently improving, we'll plan on holding nephrotoxic agents particularly ACE I    Pure hypercholesterolemia   Benign hypertensive heart disease without heart failure - blood pressure starting to creep up again. We'll plan on placing back on home beta blocker regimen      Liver masses - Will contact oncology on Monday for further evaluation and recommendations. - AFP levels pending  Code Status: Full Family Communication: Discussed with patient and family members Disposition Plan: Step down   Consultants:  None  Procedures:  CT of abdomen and pelvis with contrast  Antibiotics:  Metronidazole>>>10/18   Zosyn  HPI/Subjective: The patient reports feeling better today. No new complaints. No acute issues reported overnight  Objective: Filed Vitals:   02/25/14 1300  BP: 168/110  Pulse: 84  Temp:   Resp: 19    Intake/Output Summary (Last 24 hours) at 02/25/14 1328 Last data filed at 02/25/14 1218  Gross per 24 hour  Intake   3140 ml  Output      0 ml  Net   3140 ml   Filed Weights    02/22/14 1728 02/22/14 2100 02/23/14 0403  Weight: 98.884 kg (218 lb) 102.2 kg (225 lb 5 oz) 103.1 kg (227 lb 4.7 oz)    Exam:   General:  Patient in no acute distress, alert and awake  Cardiovascular: Regular rate and rhythm, no murmurs or  Respiratory: Clear to auscultation bilaterally, no wheezes  Abdomen: Soft, mildly distended, generalized tenderness with deep palpation, no rebound tenderness  Musculoskeletal: No cyanosis or clubbing  Data Reviewed: Basic Metabolic Panel:  Recent Labs Lab 02/22/14 1443 02/23/14 0330 02/24/14 1049 02/25/14 0803  NA 143 137 136* 137  K 4.6 4.7 3.7 3.8  CL 107 104 103 105  CO2 15* 18* 17* 17*  GLUCOSE 143* 105* 128* 103*  BUN 20 25* 30* 29*  CREATININE 1.09 1.67* 1.76* 1.56*  CALCIUM 8.9 7.9* 7.8* 8.0*   Liver Function Tests:  Recent Labs Lab 02/22/14 1443 02/23/14 0330  AST 45* 262*  ALT 27 87*  ALKPHOS 256* 202*  BILITOT 1.3* 0.8  PROT 6.6 5.3*  ALBUMIN 3.0* 2.5*    Recent Labs Lab 02/22/14 1443  LIPASE 69*   No results found for this basename: AMMONIA,  in the last 168 hours CBC:  Recent Labs Lab 02/22/14 1443 02/22/14 1811 02/23/14 0330 02/24/14 1049 02/25/14 0803  WBC 40.2* 45.3* 29.1* 40.5* 29.4*  NEUTROABS 32.6* 35.8*  --   --   --   HGB 10.3* 8.1* 7.9* 7.3* 6.9*  HCT 40.8 30.7* 30.5* 27.0* 26.3*  MCV 68.5* 66.0* 65.0* 64.9* 65.3*  PLT PLATELET CLUMPS NOTED  ON SMEAR, COUNT APPEARS ADEQUATE 223 173 134* 103*   Cardiac Enzymes:  Recent Labs Lab 02/22/14 1443  TROPONINI <0.30   BNP (last 3 results) No results found for this basename: PROBNP,  in the last 8760 hours CBG: No results found for this basename: GLUCAP,  in the last 168 hours  Recent Results (from the past 240 hour(s))  CLOSTRIDIUM DIFFICILE BY PCR     Status: None   Collection Time    02/22/14  1:54 PM      Result Value Ref Range Status   C difficile by pcr NEGATIVE  NEGATIVE Final   Comment: Performed at [place], BLOOD (ROUTINE X 2)     Status: None   Collection Time    02/22/14  2:43 PM      Result Value Ref Range Status   Specimen Description BLOOD RIGHT HAND   Final   Special Requests BOTTLES DRAWN AEROBIC ONLY 1.5ML   Final   Culture  Setup Time     Final   Value: 02/22/2014 17:03     Performed at [facility]   Culture     Final   Value:        BLOOD CULTURE RECEIVED NO GROWTH TO DATE CULTURE WILL BE HELD FOR 5 DAYS BEFORE ISSUING A FINAL NEGATIVE REPORT     Performed at [facility]   Report Status PENDING   Incomplete  CULTURE, BLOOD (ROUTINE X 2)     Status: None   Collection Time    02/22/14  2:43 PM      Result Value Ref Range Status   Specimen Description BLOOD RIGHT HAND   Final   Special Requests BOTTLES DRAWN AEROBIC ONLY 1.5ML   Final   Culture  Setup Time     Final   Value: 02/22/2014 17:04     Performed at [facility]   Culture     Final   Value:        BLOOD CULTURE RECEIVED NO GROWTH TO DATE CULTURE WILL BE HELD FOR 5 DAYS BEFORE ISSUING A FINAL NEGATIVE REPORT     Performed at [facility]   Report Status PENDING   Incomplete  URINE CULTURE     Status: None   Collection Time    02/22/14  4:58 PM      Result Value Ref Range Status   Specimen Description URINE, CLEAN CATCH   Final   Special Requests NONE   Final   Culture  Setup Time     Final   Value: 02/23/2014 00:07     Performed at [place]     Final   Value: 50,000 COLONIES/ML     Performed at [facility]   Culture     Final   Value: LACTOBACILLUS SPECIES     Note: Standardized susceptibility testing for this organism is not available.     Performed at [facility]   Report Status 02/24/2014 FINAL   Final  STOOL CULTURE     Status: None   Collection Time    02/22/14  6:10 PM      Result Value Ref Range Status   Specimen Description PERIRECTAL   Final   Special Requests NONE   Final   Culture     Final   Value: NO  SUSPICIOUS COLONIES, CONTINUING TO HOLD     Performed at [facility]  Report Status PENDING   Incomplete  MRSA PCR SCREENING     Status: None   Collection Time    02/22/14  9:53 PM      Result Value Ref Range Status   MRSA by PCR NEGATIVE  NEGATIVE Final   Comment:            The GeneXpert MRSA Assay (FDA     approved for NASAL specimens     only), is one component of a     comprehensive MRSA colonization     surveillance program. It is not     intended to diagnose MRSA     infection nor to guide or     monitor treatment for     MRSA infections.     Studies: No results found.  Scheduled Meds: . piperacillin-tazobactam (ZOSYN)  IV  3.375 g Intravenous Q8H  . sodium chloride  3 mL Intravenous Q12H   Continuous Infusions: . sodium chloride 125 mL/hr at 02/25/14 0916    Time spent: > 35 minutes    Velvet Bathe  [facility] Pager [phone] If 7PM-7AM, please contact night-coverage at [email or web address], password [facility] 02/25/2014, 1:28 PM  LOS: 3 days

## 2014-02-25 NOTE — Progress Notes (Signed)
CRITICAL VALUE ALERT  Critical value received:  hbg 6.9  Date of notification:  02/26/2015  Time of notification:  0830  Critical value read back:yes  Nurse who received alert:  Fardeen Steinberger Cheryln Manly  MD notified (1st page):  Dr. Wendee Beavers  Time of first page:  0900  MD notified (2nd page):n/a  Time of second page:n/a  Responding MD:  Dr. Wendee Beavers Time MD responded:  [phone]

## 2014-02-25 NOTE — Progress Notes (Signed)
ANTIBIOTIC CONSULT NOTE   Pharmacy Consult for Zosyn Indication: intra-abdominal infection  Allergies  Allergen Reactions  . Simvastatin Other (See Comments)    "Muscle aches"    Patient Measurements: Height: 5\' 9"  (175.3 cm) Weight: 227 lb 4.7 oz (103.1 kg) IBW/kg (Calculated) : 70.7   Vital Signs: Temp: 98.2 F (36.8 C) (10/18 0400) Temp Source: Oral (10/18 0400) BP: 116/38 mmHg (10/18 0500) Pulse Rate: 75 (10/18 0500) Intake/Output from previous day: 10/17 0701 - 10/18 0700 In: 3557 [P.O.:620; I.V.:2250; IV Piggyback:450] Out: -  Intake/Output from this shift:    Labs:  Recent Labs  02/22/14 1443  02/23/14 0330 02/24/14 1049 02/25/14 0803  WBC 40.2*  < > 29.1* 40.5* 29.4*  HGB 10.3*  < > 7.9* 7.3* 6.9*  PLT PLATELET CLUMPS NOTED ON SMEAR, COUNT APPEARS ADEQUATE  < > 173 134* PENDING  CREATININE 1.09  --  1.67* 1.76*  --   < > = values in this interval not displayed. Estimated Creatinine Clearance: 46.2 ml/min (by C-G formula based on Cr of 1.76). No results found for this basename: VANCOTROUGH, VANCOPEAK, VANCORANDOM, [place], GENTPEAK, GENTRANDOM, TOBRATROUGH, TOBRAPEAK, TOBRARND, AMIKACINPEAK, AMIKACINTROU, AMIKACIN,  in the last 72 hours   Microbiology: Recent Results (from the past 720 hour(s))  CLOSTRIDIUM DIFFICILE BY PCR     Status: None   Collection Time    02/22/14  1:54 PM      Result Value Ref Range Status   C difficile by pcr NEGATIVE  NEGATIVE Final   Comment: Performed at [place], BLOOD (ROUTINE X 2)     Status: None   Collection Time    02/22/14  2:43 PM      Result Value Ref Range Status   Specimen Description BLOOD RIGHT HAND   Final   Special Requests BOTTLES DRAWN AEROBIC ONLY 1.5ML   Final   Culture  Setup Time     Final   Value: 02/22/2014 17:03     Performed at [facility]   Culture     Final   Value:        BLOOD CULTURE RECEIVED NO GROWTH TO DATE CULTURE WILL BE HELD FOR 5 DAYS BEFORE ISSUING A  FINAL NEGATIVE REPORT     Performed at [facility]   Report Status PENDING   Incomplete  CULTURE, BLOOD (ROUTINE X 2)     Status: None   Collection Time    02/22/14  2:43 PM      Result Value Ref Range Status   Specimen Description BLOOD RIGHT HAND   Final   Special Requests BOTTLES DRAWN AEROBIC ONLY 1.5ML   Final   Culture  Setup Time     Final   Value: 02/22/2014 17:04     Performed at [facility]   Culture     Final   Value:        BLOOD CULTURE RECEIVED NO GROWTH TO DATE CULTURE WILL BE HELD FOR 5 DAYS BEFORE ISSUING A FINAL NEGATIVE REPORT     Performed at [facility]   Report Status PENDING   Incomplete  URINE CULTURE     Status: None   Collection Time    02/22/14  4:58 PM      Result Value Ref Range Status   Specimen Description URINE, CLEAN CATCH   Final   Special Requests NONE   Final   Culture  Setup Time     Final   Value: 02/23/2014  00:07     Performed at [place]     Final   Value: 50,000 COLONIES/ML     Performed at [facility]   Culture     Final   Value: LACTOBACILLUS SPECIES     Note: Standardized susceptibility testing for this organism is not available.     Performed at [facility]   Report Status 02/24/2014 FINAL   Final  STOOL CULTURE     Status: None   Collection Time    02/22/14  6:10 PM      Result Value Ref Range Status   Specimen Description PERIRECTAL   Final   Special Requests NONE   Final   Culture     Final   Value: NO SUSPICIOUS COLONIES, CONTINUING TO HOLD     Performed at [facility]   Report Status PENDING   Incomplete  MRSA PCR SCREENING     Status: None   Collection Time    02/22/14  9:53 PM      Result Value Ref Range Status   MRSA by PCR NEGATIVE  NEGATIVE Final   Comment:            The GeneXpert MRSA Assay (FDA     approved for NASAL specimens     only), is one component of a     comprehensive MRSA colonization     surveillance program. It is  not     intended to diagnose MRSA     infection nor to guide or     monitor treatment for     MRSA infections.    Medical History: Past Medical History  Diagnosis Date  . Hyperlipidemia   . Hypertension   . Ischemic heart disease   . Obesity     Assessment: 81 yoM with PMH of HTN, ischemic heart disease. Presented to ED with diarrhea, distended abdomen with diffuse cramping pain. Pharmacy consulted to dose Zosyn for intra-abdominal infection d/t colitis and possible sepsis. (first dose ordered in ER)  10/15 >> Zosyn >> 10/15 >> Flagyl >>  Tmax: afeb WBCs: 40.2 > 29.4 Renal: SCr increased to 1.67, CrCl~59CG  10/15 blood x2: ngtd 10/15 urine: 50K lactobacillus (PCN will cover) 10/15 stool cx: no suspicious colonies - continuing to hold 10/15 c.diff PCR: negative 10/15 MRSA screen: neg 10/16 GI pathogen panel: neg  Goal of Therapy:  Appropriate antibiotic dosing for renal function; eradication of infection  Plan:  Day #3 zosyn  Continue zosyn 3.375g IV 8H (extended infusion over 4 hours)  Suggest D/C metronidazole, C. difficle A/B negative on GI pathogen panel, PCR also negative. Zosyn as good anaerobic coverage  Doreene Eland, PharmD, BCPS.   Pager: [phone] 02/25/2014 8:44 AM

## 2014-02-26 DIAGNOSIS — K7460 Unspecified cirrhosis of liver: Secondary | ICD-10-CM

## 2014-02-26 DIAGNOSIS — R188 Other ascites: Secondary | ICD-10-CM

## 2014-02-26 LAB — CBC
HCT: 26.3 % — ABNORMAL LOW (ref 39.0–52.0)
HEMATOCRIT: 35 % — AB (ref 39.0–52.0)
HEMOGLOBIN: 9.9 g/dL — AB (ref 13.0–17.0)
Hemoglobin: 6.9 g/dL — CL (ref 13.0–17.0)
MCH: 17.1 pg — AB (ref 26.0–34.0)
MCH: 19.5 pg — ABNORMAL LOW (ref 26.0–34.0)
MCHC: 26.2 g/dL — ABNORMAL LOW (ref 30.0–36.0)
MCHC: 28.3 g/dL — AB (ref 30.0–36.0)
MCV: 65.3 fL — AB (ref 78.0–100.0)
MCV: 69 fL — ABNORMAL LOW (ref 78.0–100.0)
Platelets: 103 10*3/uL — ABNORMAL LOW (ref 150–400)
Platelets: 118 10*3/uL — ABNORMAL LOW (ref 150–400)
RBC: 4.03 MIL/uL — AB (ref 4.22–5.81)
RBC: 5.07 MIL/uL (ref 4.22–5.81)
RDW: 19.6 % — ABNORMAL HIGH (ref 11.5–15.5)
RDW: 22 % — ABNORMAL HIGH (ref 11.5–15.5)
WBC: 29.4 10*3/uL — ABNORMAL HIGH (ref 4.0–10.5)
WBC: 26.9 10*3/uL — ABNORMAL HIGH (ref 4.0–10.5)

## 2014-02-26 LAB — TYPE AND SCREEN
ABO/RH(D): B POS
ANTIBODY SCREEN: NEGATIVE
UNIT DIVISION: 0
Unit division: 0

## 2014-02-26 LAB — BASIC METABOLIC PANEL
Anion gap: 14 (ref 5–15)
BUN: 28 mg/dL — AB (ref 6–23)
CO2: 18 mEq/L — ABNORMAL LOW (ref 19–32)
Calcium: 8.2 mg/dL — ABNORMAL LOW (ref 8.4–10.5)
Chloride: 106 mEq/L (ref 96–112)
Creatinine, Ser: 1.37 mg/dL — ABNORMAL HIGH (ref 0.50–1.35)
GFR calc Af Amer: 59 mL/min — ABNORMAL LOW (ref >90)
GFR calc non Af Amer: 51 mL/min — ABNORMAL LOW (ref >90)
GLUCOSE: 96 mg/dL (ref 70–99)
Potassium: 3.5 mEq/L — ABNORMAL LOW (ref 3.7–5.3)
Sodium: 138 mEq/L (ref 137–147)

## 2014-02-26 LAB — AFP TUMOR MARKER: AFP-Tumor Marker: 2.2 ng/mL (ref <6.1)

## 2014-02-26 LAB — STOOL CULTURE

## 2014-02-26 NOTE — Progress Notes (Signed)
[place] NOTE  Patient Care Team: [name], MD as PCP - General (Cardiology)  CHIEF COMPLAINTS/PURPOSE OF CONSULTATION:  Liver masses and colitis  HISTORY OF PRESENTING ILLNESS:  Frank [name] 68 y.o. male is here because he was not feeling well and his abdomen and comfortable over the past month or so. He was having abdominal distention and also recently diarrhea prior to coming to the hospital. CT of abdomen and pelvis revealed evidence of colitis thickening of the colon. It was also noted on that scan that he had large masses in the liver the right lobe measuring 6.8 cm and the left measuring 3.6 cm in addition a cyst in the left hepatic lobe is noted there was evidence of liver cirrhosis raising the suspicion that he may have primary hepatocellular carcinoma. Is being treated with antibiotics and IV fluids with improvement of like symptoms. On blood work he was found to have a CEA of 3790 and a alpha-fetoprotein of 2.2 raising the suspicion that he may have metastatic colon cancer with liver metastases. His liver function tests done on 02/23/2014 revealed AST of 262 ALT of 87 with an alkaline phosphatase 202. Bilirubin was 0.8 and a serum creatinine of 1.67. We're consulted to assess and come up with a treatment plan regarding both of these issues. Prior to All of this, patient played golf regularly. I reviewed her records extensively and collaborated the history with the patient.  MEDICAL HISTORY:  Past Medical History  Diagnosis Date  . Hyperlipidemia   . Hypertension   . Ischemic heart disease   . Obesity     SURGICAL HISTORY: Past Surgical History  Procedure Laterality Date  . Coronary artery bypass graft    . Back surgery      SOCIAL HISTORY: History   Social History  . Marital Status: Married    Spouse Name: N/A    Number of Children: N/A  . Years of Education: N/A   Occupational History  . salesman    Social History Main Topics   . Smoking status: Former Research scientist (life sciences)  . Smokeless tobacco: Not on file  . Alcohol Use: 0.6 oz/week    1 Cans of beer per week     Comment: 2 to 6 beers a week  . Drug Use: No  . Sexual Activity: Not on file   Other Topics Concern  . Not on file   Social History Narrative  . No narrative on file    FAMILY HISTORY: Family History  Problem Relation Age of Onset  . Cancer Father 64  . Peripheral vascular disease Mother 50    deceased    ALLERGIES:  is allergic to simvastatin.  MEDICATIONS:  Current Facility-Administered Medications  Medication Dose Route Frequency Provider Last Rate Last Dose  . acetaminophen (TYLENOL) tablet 650 mg  650 mg Oral Q6H PRN Shanker Kristeen Mans, MD       Or  . acetaminophen (TYLENOL) suppository 650 mg  650 mg Rectal Q6H PRN Shanker Kristeen Mans, MD      . albuterol (PROVENTIL) (2.5 MG/3ML) 0.083% nebulizer solution 2.5 mg  2.5 mg Nebulization Q2H PRN Shanker Kristeen Mans, MD      . atenolol (TENORMIN) tablet 12.5 mg  12.5 mg Oral BID Velvet Bathe, MD   12.5 mg at 02/26/14 1028  . guaiFENesin-dextromethorphan (ROBITUSSIN DM) 100-10 MG/5ML syrup 5 mL  5 mL Oral Q4H PRN Shanker Kristeen Mans, MD      . morphine 2 MG/ML injection  1 mg  1 mg Intravenous Q4H PRN Shanker Kristeen Mans, MD      . ondansetron [facility]) tablet 4 mg  4 mg Oral Q6H PRN Shanker Kristeen Mans, MD       Or  . ondansetron (ZOFRAN) injection 4 mg  4 mg Intravenous Q6H PRN Shanker Kristeen Mans, MD      . oxyCODONE (Oxy IR/ROXICODONE) immediate release tablet 5 mg  5 mg Oral Q4H PRN Jonetta Osgood, MD   5 mg at 02/25/14 1943  . piperacillin-tazobactam (ZOSYN) IVPB 3.375 g  3.375 g Intravenous Q8H Donald Prose Runyon, RPH   3.375 g at 02/26/14 1028  . sodium chloride 0.9 % injection 3 mL  3 mL Intravenous Q12H Jonetta Osgood, MD   3 mL at 02/24/14 2100    REVIEW OF SYSTEMS:   Constitutional: Denies fevers, chills or abnormal night sweats Eyes: Denies blurriness of vision, double vision or watery eyes Ears,  nose, mouth, throat, and face: Denies mucositis or sore throat Respiratory: Denies cough, dyspnea or wheezes Cardiovascular: Denies palpitation, chest discomfort or lower extremity swelling Gastrointestinal:  Abdominal distention and diarrhea Skin: Denies abnormal skin rashes Lymphatics: Denies new lymphadenopathy or easy bruising Neurological:Denies numbness, tingling or new weaknesses Behavioral/Psych: Mood is stable, no new changes  All other systems were reviewed with the patient and are negative.  PHYSICAL EXAMINATION: ECOG PERFORMANCE STATUS: 1 - Symptomatic but completely ambulatory  Filed Vitals:   02/26/14 1256  BP: 145/52  Pulse: 65  Temp: 98.5 F (36.9 C)  Resp: 18   Filed Weights   02/22/14 1728 02/22/14 2100 02/23/14 0403  Weight: 218 lb (98.884 kg) 225 lb 5 oz (102.2 kg) 227 lb 4.7 oz (103.1 kg)    GENERAL:alert, no distress and comfortable SKIN: skin color, texture, turgor are normal, no rashes or significant lesions EYES: normal, conjunctiva are pink and non-injected, sclera clear OROPHARYNX:no exudate, no erythema and lips, buccal mucosa, and tongue normal  NECK: supple, thyroid normal size, non-tender, without nodularity LYMPH:  no palpable lymphadenopathy in the cervical, axillary or inguinal LUNGS: clear to auscultation and percussion with normal breathing effort HEART: regular rate & rhythm and no murmurs and no lower extremity edema ABDOMEN: Markedly distended with the dullness to percussion of the flanks and fluid shift Musculoskeletal:no cyanosis of digits and no clubbing  PSYCH: alert & oriented x 3 with fluent speech NEURO: no focal motor/sensory deficits   LABORATORY DATA:  I have reviewed the data as listed Lab Results  Component Value Date   WBC 26.9* 02/26/2014   HGB 9.9* 02/26/2014   HCT 35.0* 02/26/2014   MCV 69.0* 02/26/2014   PLT 118* 02/26/2014   Lab Results  Component Value Date   NA 138 02/26/2014   K 3.5* 02/26/2014   CL 106  02/26/2014   CO2 18* 02/26/2014    RADIOGRAPHIC STUDIES: I have personally reviewed the radiological reports and agreed with the findings in the report. CT of the abdomen and pelvis 02/22/2014 revealed bilateral liver masses and thickening of the colon  ASSESSMENT AND PLAN:  1. right and left liver masses with elevated CEA: Alpha-fetoprotein is 2.2. Given the evidence of cirrhosis of the liver, primary hepatocellular carcinoma still in the differential even with a normal alpha-fetoprotein. I recommended obtaining liver MRI to evaluate this further. If the liver MRI is not suspicious for primary hepatocellular carcinoma, I would like to obtain a liver biopsy for further evaluation.  2. I discussed with them that they approached  her primary hepatocellular carcinoma his completely different from metastatic carcinoma with liver metastases.  3. ascites: Related to cirrhosis of the liver. 4. colitis: Patient will need colonoscopy at some point in the future once his infection is fully treated.  Once the patient is discharged, he will need to be set up to see Dr. Illene Regulus, my partner who does gastrointestinal oncology for outpatient followup and treatment decisions.  All questions were answered. The patient knows to call the clinic with any problems, questions or concerns. I spent 40 minutes counseling the patient face to face. The total time spent in the appointment was 60 minutes and more than 50% was on counseling.     Rulon Eisenmenger, MD @T @ 5:17 PM

## 2014-02-26 NOTE — Progress Notes (Signed)
[facility] PROGRESS NOTE  Frank [name] ZJQ:[id number] DOB: 1946/02/26 DOA: 02/22/2014 PCP: [name], MD  Assessment/Plan: Sepsis - Resolving on current antibiotic regimen - Hypotension resolved - Also patient had elevation in serum creatinine which could be secondary to sepsis related hypotension - Transfer to Step down unit for further evaluation and recommendations.  - Continue Zosyn, flagyl d/c'd after c diff was found to be negative.    Colitis - Most likely cause of principal problem. - Continue to monitor wbc levels. Currently trending down from 40 to 26.9 - Continue current antibiotic regimen of Zosyn  Active Problems:   Ischemic heart disease - Stable no chest pain reported today - Troponin levels reviewed and on last check within normal limits  Acute renal failure - And context of patient who presented with sepsis and hypotension. - Serum creatinine currently improving, we'll plan on holding nephrotoxic agents particularly ACE I    Pure hypercholesterolemia   Benign hypertensive heart disease without heart failure - blood pressure starting to creep up again. We'll plan on placing back on home beta blocker regimen      Liver masses - Will contact oncology on Monday for further evaluation and recommendations. - AFP levels pending  Code Status: Full Family Communication: Discussed with patient and family members Disposition Plan: med surg   Consultants:  None  Procedures:  CT of abdomen and pelvis with contrast  Antibiotics:  Metronidazole>>>10/18   Zosyn  HPI/Subjective: Pt feels bloated. No New complaints  Objective: Filed Vitals:   02/26/14 1256  BP: 145/52  Pulse: 65  Temp: 98.5 F (36.9 C)  Resp: 18    Intake/Output Summary (Last 24 hours) at 02/26/14 1932 Last data filed at 02/26/14 1500  Gross per 24 hour  Intake 2579.17 ml  Output     75 ml  Net 2504.17 ml   Filed Weights   02/22/14 1728 02/22/14 2100 02/23/14  0403  Weight: 98.884 kg (218 lb) 102.2 kg (225 lb 5 oz) 103.1 kg (227 lb 4.7 oz)    Exam:   General:  Patient in no acute distress, alert and awake  Cardiovascular: Regular rate and rhythm, no murmurs or  Respiratory: Clear to auscultation bilaterally, no wheezes  Abdomen: Soft, mildly distended, generalized tenderness with deep palpation, no rebound tenderness  Musculoskeletal: No cyanosis or clubbing  Data Reviewed: Basic Metabolic Panel:  Recent Labs Lab 02/22/14 1443 02/23/14 0330 02/24/14 1049 02/25/14 0803 02/26/14 0419  NA 143 137 136* 137 138  K 4.6 4.7 3.7 3.8 3.5*  CL 107 104 103 105 106  CO2 15* 18* 17* 17* 18*  GLUCOSE 143* 105* 128* 103* 96  BUN 20 25* 30* 29* 28*  CREATININE 1.09 1.67* 1.76* 1.56* 1.37*  CALCIUM 8.9 7.9* 7.8* 8.0* 8.2*   Liver Function Tests:  Recent Labs Lab 02/22/14 1443 02/23/14 0330  AST 45* 262*  ALT 27 87*  ALKPHOS 256* 202*  BILITOT 1.3* 0.8  PROT 6.6 5.3*  ALBUMIN 3.0* 2.5*    Recent Labs Lab 02/22/14 1443  LIPASE 69*   No results found for this basename: AMMONIA,  in the last 168 hours CBC:  Recent Labs Lab 02/22/14 1443 02/22/14 1811 02/23/14 0330 02/24/14 1049 02/25/14 0803 02/26/14 0419  WBC 40.2* 45.3* 29.1* 40.5* 29.4* 26.9*  NEUTROABS 32.6* 35.8*  --   --   --   --   HGB 10.3* 8.1* 7.9* 7.3* 6.9* 9.9*  HCT 40.8 30.7* 30.5* 27.0* 26.3* 35.0*  MCV 68.5* 66.0*  65.0* 64.9* 65.3* 69.0*  PLT PLATELET CLUMPS NOTED ON SMEAR, COUNT APPEARS ADEQUATE 223 173 134* 103* 118*   Cardiac Enzymes:  Recent Labs Lab 02/22/14 1443  TROPONINI <0.30   BNP (last 3 results) No results found for this basename: PROBNP,  in the last 8760 hours CBG: No results found for this basename: GLUCAP,  in the last 168 hours  Recent Results (from the past 240 hour(s))  CLOSTRIDIUM DIFFICILE BY PCR     Status: None   Collection Time    02/22/14  1:54 PM      Result Value Ref Range Status   C difficile by pcr NEGATIVE   NEGATIVE Final   Comment: Performed at [place], BLOOD (ROUTINE X 2)     Status: None   Collection Time    02/22/14  2:43 PM      Result Value Ref Range Status   Specimen Description BLOOD RIGHT HAND   Final   Special Requests BOTTLES DRAWN AEROBIC ONLY 1.5ML   Final   Culture  Setup Time     Final   Value: 02/22/2014 17:03     Performed at [facility]   Culture     Final   Value:        BLOOD CULTURE RECEIVED NO GROWTH TO DATE CULTURE WILL BE HELD FOR 5 DAYS BEFORE ISSUING A FINAL NEGATIVE REPORT     Performed at [facility]   Report Status PENDING   Incomplete  CULTURE, BLOOD (ROUTINE X 2)     Status: None   Collection Time    02/22/14  2:43 PM      Result Value Ref Range Status   Specimen Description BLOOD RIGHT HAND   Final   Special Requests BOTTLES DRAWN AEROBIC ONLY 1.5ML   Final   Culture  Setup Time     Final   Value: 02/22/2014 17:04     Performed at [facility]   Culture     Final   Value:        BLOOD CULTURE RECEIVED NO GROWTH TO DATE CULTURE WILL BE HELD FOR 5 DAYS BEFORE ISSUING A FINAL NEGATIVE REPORT     Performed at [facility]   Report Status PENDING   Incomplete  URINE CULTURE     Status: None   Collection Time    02/22/14  4:58 PM      Result Value Ref Range Status   Specimen Description URINE, CLEAN CATCH   Final   Special Requests NONE   Final   Culture  Setup Time     Final   Value: 02/23/2014 00:07     Performed at [place]     Final   Value: 50,000 COLONIES/ML     Performed at [facility]   Culture     Final   Value: LACTOBACILLUS SPECIES     Note: Standardized susceptibility testing for this organism is not available.     Performed at [facility]   Report Status 02/24/2014 FINAL   Final  STOOL CULTURE     Status: None   Collection Time    02/22/14  6:10 PM      Result Value Ref Range Status   Specimen Description PERIRECTAL   Final   Special  Requests NONE   Final   Culture     Final   Value: NO SALMONELLA, SHIGELLA, CAMPYLOBACTER, YERSINIA, OR E.COLI  0157:H7 ISOLATED     Performed at [facility]   Report Status 02/26/2014 FINAL   Final  MRSA PCR SCREENING     Status: None   Collection Time    02/22/14  9:53 PM      Result Value Ref Range Status   MRSA by PCR NEGATIVE  NEGATIVE Final   Comment:            The GeneXpert MRSA Assay (FDA     approved for NASAL specimens     only), is one component of a     comprehensive MRSA colonization     surveillance program. It is not     intended to diagnose MRSA     infection nor to guide or     monitor treatment for     MRSA infections.     Studies: No results found.  Scheduled Meds: . atenolol  12.5 mg Oral BID  . piperacillin-tazobactam (ZOSYN)  IV  3.375 g Intravenous Q8H  . sodium chloride  3 mL Intravenous Q12H   Continuous Infusions:    Time spent: > 35 minutes    Velvet Bathe  [facility] Pager [phone] If 7PM-7AM, please contact night-coverage at [email or web address], password [facility] 02/26/2014, 7:32 PM  LOS: 4 days

## 2014-02-27 ENCOUNTER — Inpatient Hospital Stay (HOSPITAL_COMMUNITY): Payer: Medicare Other

## 2014-02-27 LAB — CEA: CEA: 3221.4 ng/mL — ABNORMAL HIGH (ref 0.0–5.0)

## 2014-02-27 MED ORDER — GADOXETATE DISODIUM 0.25 MMOL/ML IV SOLN
10.0000 mL | Freq: Once | INTRAVENOUS | Status: AC | PRN
  Administered 2014-02-27: 10 mL via INTRAVENOUS

## 2014-02-27 NOTE — Progress Notes (Signed)
SUBJECTIVE: Complains of abdominal bloating and distention  OBJECTIVE PHYSICAL EXAMINATION: ECOG PERFORMANCE STATUS: 2 - Symptomatic, <50% confined to bed  Filed Vitals:   02/27/14 1422  BP: 165/71  Pulse: 63  Temp: 98.2 F (36.8 C)  Resp: 18   Filed Weights   02/22/14 1728 02/22/14 2100 02/23/14 0403  Weight: 218 lb (98.884 kg) 225 lb 5 oz (102.2 kg) 227 lb 4.7 oz (103.1 kg)    GENERAL:alert, no distress and comfortable SKIN: Pallor EYES: normal, Conjunctiva are pink and non-injected, sclera clear OROPHARYNX:no exudate, no erythema and lips, buccal mucosa, and tongue normal  NECK: supple, thyroid normal size, non-tender, without nodularity LYMPH:  no palpable lymphadenopathy in the cervical, axillary or inguinal LUNGS: clear to auscultation and percussion with normal breathing effort HEART: regular rate & rhythm and no murmurs and 1+ lower extremity edema ABDOMEN: Ascites  Musculoskeletal:no cyanosis of digits and no clubbing  NEURO: alert & oriented x 3 with fluent speech, no focal motor/sensory deficits  LABORATORY DATA:  I have reviewed the data as listed @LASTCHEMISTRY @  Lab Results  Component Value Date   WBC 26.9* 02/26/2014   HGB 9.9* 02/26/2014   HCT 35.0* 02/26/2014   MCV 69.0* 02/26/2014   PLT 118* 02/26/2014   NEUTROABS 35.8* 02/22/2014    ASSESSMENT AND PLAN: 1. Liver metastases: I reviewed the MRI report that the patient would suggest that the liver lesions are most likely metastatic in nature. I strongly suspect: Is a primary. Because of recent colitis, may be difficult to perform colonoscopy. Hence I recommended doing a CT or ultrasound guided liver biopsy. I will get a PT PTT INR tomorrow. Markedly elevated CEA suggestive of GI primary.  2. Ascites: With some evidence of cirrhosis of liver. The liver biopsy also able to give [place] more information regarding the extent of cirrhosis. Patient will need diuretics to help get her of the abdominal  fluid.  Once the biopsies performed, he can be discharged home to follow up with Dr. Illene Regulus. He will need a PET/CT scan upon discharge.

## 2014-02-27 NOTE — Progress Notes (Signed)
[facility] PROGRESS NOTE  NICKHOLAS GOLDSTON KZS:[id number] DOB: [birth year] DOA: 02/22/2014 PCP: [name], MD Brief narrative: - Patient is a 68 year old Caucasian male who presented with sepsis, hypotension, secondary to colitis. Patient on imaging scan had thickened distal transverse and descending colon and multiple hepatic masses. Oncology subsequently consulted and currently assisting with workup.  Assessment/Plan: Sepsis - Resolving on current antibiotic regimen - Hypotension resolved - Also patient had elevation in serum creatinine which could be secondary to sepsis related hypotension - Transfer to Step down unit for further evaluation and recommendations.  - Continue Zosyn, flagyl d/c'd after c diff was found to be negative.    Colitis - Most likely cause of principal problem. - Continue to monitor wbc levels. Currently trending down from 40 to 26.9 - Continue current antibiotic regimen of Zosyn  Active Problems:   Ischemic heart disease - Stable no chest pain reported today - Troponin levels reviewed and on last check within normal limits  Acute renal failure -Into the context of patient who presented with sepsis and hypotension. - Serum creatinine continues to trend down, we'll plan on holding nephrotoxic agents particularly ACE I    Pure hypercholesterolemia   Benign hypertensive heart disease without heart failure - blood pressure starting to creep up again. We'll plan on placing back on home beta blocker regimen      Liver masses -Oncology on board and considering liver biopsy pending MRI results - AFP ordered and reported as normal limits at 2.2  Code Status: Full Family Communication: Discussed with patient and family members Disposition Plan: med surg   Consultants:  None  Procedures:  CT of abdomen and pelvis with contrast  Antibiotics:  Metronidazole>>>10/18   Zosyn  HPI/Subjective: Pt feels bloated. No acute issues reported  overnight  Objective: Filed Vitals:   02/27/14 1422  BP: 165/71  Pulse: 63  Temp: 98.2 F (36.8 C)  Resp: 18    Intake/Output Summary (Last 24 hours) at 02/27/14 1522 Last data filed at 02/27/14 1300  Gross per 24 hour  Intake  132.5 ml  Output      0 ml  Net  132.5 ml   Filed Weights   02/22/14 1728 02/22/14 2100 02/23/14 0403  Weight: 98.884 kg (218 lb) 102.2 kg (225 lb 5 oz) 103.1 kg (227 lb 4.7 oz)    Exam:   General:  Patient in no acute distress, alert and awake  Cardiovascular: Regular rate and rhythm, no murmurs or  Respiratory: Clear to auscultation bilaterally, no wheezes  Abdomen: Soft, mildly distended, generalized tenderness with deep palpation, no rebound tenderness  Musculoskeletal: No cyanosis or clubbing  Data Reviewed: Basic Metabolic Panel:  Recent Labs Lab 02/22/14 1443 02/23/14 0330 02/24/14 1049 02/25/14 0803 02/26/14 0419  NA 143 137 136* 137 138  K 4.6 4.7 3.7 3.8 3.5*  CL 107 104 103 105 106  CO2 15* 18* 17* 17* 18*  GLUCOSE 143* 105* 128* 103* 96  BUN 20 25* 30* 29* 28*  CREATININE 1.09 1.67* 1.76* 1.56* 1.37*  CALCIUM 8.9 7.9* 7.8* 8.0* 8.2*   Liver Function Tests:  Recent Labs Lab 02/22/14 1443 02/23/14 0330  AST 45* 262*  ALT 27 87*  ALKPHOS 256* 202*  BILITOT 1.3* 0.8  PROT 6.6 5.3*  ALBUMIN 3.0* 2.5*    Recent Labs Lab 02/22/14 1443  LIPASE 69*   No results found for this basename: AMMONIA,  in the last 168 hours CBC:  Recent Labs Lab 02/22/14 1443 02/22/14 1811  02/23/14 0330 02/24/14 1049 02/25/14 0803 02/26/14 0419  WBC 40.2* 45.3* 29.1* 40.5* 29.4* 26.9*  NEUTROABS 32.6* 35.8*  --   --   --   --   HGB 10.3* 8.1* 7.9* 7.3* 6.9* 9.9*  HCT 40.8 30.7* 30.5* 27.0* 26.3* 35.0*  MCV 68.5* 66.0* 65.0* 64.9* 65.3* 69.0*  PLT PLATELET CLUMPS NOTED ON SMEAR, COUNT APPEARS ADEQUATE 223 173 134* 103* 118*   Cardiac Enzymes:  Recent Labs Lab 02/22/14 1443  TROPONINI <0.30   BNP (last 3 results) No  results found for this basename: PROBNP,  in the last 8760 hours CBG: No results found for this basename: GLUCAP,  in the last 168 hours  Recent Results (from the past 240 hour(s))  CLOSTRIDIUM DIFFICILE BY PCR     Status: None   Collection Time    02/22/14  1:54 PM      Result Value Ref Range Status   C difficile by pcr NEGATIVE  NEGATIVE Final   Comment: Performed at [place], BLOOD (ROUTINE X 2)     Status: None   Collection Time    02/22/14  2:43 PM      Result Value Ref Range Status   Specimen Description BLOOD RIGHT HAND   Final   Special Requests BOTTLES DRAWN AEROBIC ONLY 1.5ML   Final   Culture  Setup Time     Final   Value: 02/22/2014 17:03     Performed at [facility]   Culture     Final   Value:        BLOOD CULTURE RECEIVED NO GROWTH TO DATE CULTURE WILL BE HELD FOR 5 DAYS BEFORE ISSUING A FINAL NEGATIVE REPORT     Performed at [facility]   Report Status PENDING   Incomplete  CULTURE, BLOOD (ROUTINE X 2)     Status: None   Collection Time    02/22/14  2:43 PM      Result Value Ref Range Status   Specimen Description BLOOD RIGHT HAND   Final   Special Requests BOTTLES DRAWN AEROBIC ONLY 1.5ML   Final   Culture  Setup Time     Final   Value: 02/22/2014 17:04     Performed at [facility]   Culture     Final   Value:        BLOOD CULTURE RECEIVED NO GROWTH TO DATE CULTURE WILL BE HELD FOR 5 DAYS BEFORE ISSUING A FINAL NEGATIVE REPORT     Performed at [facility]   Report Status PENDING   Incomplete  URINE CULTURE     Status: None   Collection Time    02/22/14  4:58 PM      Result Value Ref Range Status   Specimen Description URINE, CLEAN CATCH   Final   Special Requests NONE   Final   Culture  Setup Time     Final   Value: 02/23/2014 00:07     Performed at [place]     Final   Value: 50,000 COLONIES/ML     Performed at [facility]   Culture     Final   Value:  LACTOBACILLUS SPECIES     Note: Standardized susceptibility testing for this organism is not available.     Performed at [facility]   Report Status 02/24/2014 FINAL   Final  STOOL CULTURE     Status: None   Collection  Time    02/22/14  6:10 PM      Result Value Ref Range Status   Specimen Description PERIRECTAL   Final   Special Requests NONE   Final   Culture     Final   Value: NO SALMONELLA, SHIGELLA, CAMPYLOBACTER, YERSINIA, OR E.COLI 0157:H7 ISOLATED     Performed at [facility]   Report Status 02/26/2014 FINAL   Final  MRSA PCR SCREENING     Status: None   Collection Time    02/22/14  9:53 PM      Result Value Ref Range Status   MRSA by PCR NEGATIVE  NEGATIVE Final   Comment:            The GeneXpert MRSA Assay (FDA     approved for NASAL specimens     only), is one component of a     comprehensive MRSA colonization     surveillance program. It is not     intended to diagnose MRSA     infection nor to guide or     monitor treatment for     MRSA infections.     Studies: Mr Liver W Wo Contrast  March 23, 2014   CLINICAL DATA:  Abdominal distention with diarrhea. CT demonstrates cirrhosis with multiple liver lesions. Evaluate for hepatocellular carcinoma. Initial encounter.  EXAM: MRI ABDOMEN WITHOUT AND WITH CONTRAST  TECHNIQUE: Multiplanar multisequence MR imaging of the abdomen was performed both before and after the administration of intravenous contrast.  CONTRAST:  10 ml Eovist.  COMPARISON:  Abdominal pelvic CT 02/22/2014.  FINDINGS: The examination is mildly motion degraded. As demonstrated on CT, there are bilateral pleural effusions with a moderate amount of ascites throughout the abdominal peritoneum. No peritoneal nodularity or abnormal enhancement is identified.  The liver is grossly abnormal with multiple conglomerate masses. Because of the conglomeration, the individual lesions are difficult to measure. There is one in the right hepatic lobe measuring  approximately 10.2 cm on image 34 of series 504. One more inferiorly in the right lobe measures 6.2 cm on image 48, and there is a 2.7 cm lesion in the left lobe on image 48. These lesions demonstrate heterogeneous T1 and T2 signal. Post-contrast, there is no significant hyper enhancement on the arterial phase images. The lesions are most conspicuous on the portal phase images on which they enhance less than background liver. The lesions fade on the delayed hepatocyte phase images. There is an approximately 1.8 cm cyst in the left hepatic lobe.  Suggested contour irregularity of the liver may be secondary to the intrinsic lesions and adjacent ascites rather than cirrhosis. The portal, hepatic, superior mesenteric and splenic veins are patent. Calcified gallstones and gallbladder wall thickening are noted. There is no significant biliary dilatation. The spleen, pancreas, adrenal glands and kidneys demonstrate no significant findings. As on CT, there are mildly prominent lymph nodes within the porta hepatis, nonspecific.  Generalized subcutaneous edema is noted. Patient is status post median sternotomy. No focal bowel lesions are identified.  IMPRESSION: 1. 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 Multifocal hepatocellular carcinoma is considered less likely based on the enhancement pattern, normal serum AFP and markedly elevated serum CEA levels. Occult GI malignancy suspected. Given the wall thickening of the descending and sigmoid colon on CT, further evaluation with colonoscopy recommended. 2. Bilateral pleural effusions and ascites without abnormal enhancement. 3. Nonspecific mildly prominent lymph nodes within the porta hepatis.   Electronically Signed   By: Rush Landmark  Lin Landsman M.D.   On: 02/27/2014 12:20    Scheduled Meds: . atenolol  12.5 mg Oral BID  . piperacillin-tazobactam (ZOSYN)  IV  3.375 g Intravenous Q8H  . sodium chloride  3 mL Intravenous Q12H    Continuous Infusions:    Time spent: > 35 minutes    Velvet Bathe  [facility] Pager [phone] If 7PM-7AM, please contact night-coverage at [email or web address], password [facility] 02/27/2014, 3:22 PM  LOS: 5 days

## 2014-02-28 ENCOUNTER — Encounter (HOSPITAL_COMMUNITY): Payer: Self-pay | Admitting: Radiology

## 2014-02-28 DIAGNOSIS — C787 Secondary malignant neoplasm of liver and intrahepatic bile duct: Secondary | ICD-10-CM

## 2014-02-28 DIAGNOSIS — C801 Malignant (primary) neoplasm, unspecified: Secondary | ICD-10-CM

## 2014-02-28 DIAGNOSIS — N179 Acute kidney failure, unspecified: Secondary | ICD-10-CM

## 2014-02-28 LAB — PROTIME-INR
INR: 1.36 (ref 0.00–1.49)
Prothrombin Time: 16.9 seconds — ABNORMAL HIGH (ref 11.6–15.2)

## 2014-02-28 LAB — APTT: aPTT: 38 seconds — ABNORMAL HIGH (ref 24–37)

## 2014-02-28 LAB — CBC
HEMATOCRIT: 33 % — AB (ref 39.0–52.0)
HEMOGLOBIN: 9.4 g/dL — AB (ref 13.0–17.0)
MCH: 19.3 pg — ABNORMAL LOW (ref 26.0–34.0)
MCHC: 28.5 g/dL — ABNORMAL LOW (ref 30.0–36.0)
MCV: 67.6 fL — AB (ref 78.0–100.0)
Platelets: 99 10*3/uL — ABNORMAL LOW (ref 150–400)
RBC: 4.88 MIL/uL (ref 4.22–5.81)
RDW: 23.2 % — ABNORMAL HIGH (ref 11.5–15.5)
WBC: 21.4 10*3/uL — AB (ref 4.0–10.5)

## 2014-02-28 LAB — CULTURE, BLOOD (ROUTINE X 2)
Culture: NO GROWTH
Culture: NO GROWTH

## 2014-02-28 MED ORDER — ATENOLOL 25 MG PO TABS
25.0000 mg | ORAL_TABLET | Freq: Two times a day (BID) | ORAL | Status: DC
  Administered 2014-02-28 – 2014-03-02 (×4): 25 mg via ORAL
  Filled 2014-02-28 (×5): qty 1

## 2014-02-28 NOTE — Progress Notes (Signed)
Chaplain visited pt to drop off advanced directive form and offer assistance. Pt declined a visit and accepted AD form. Chaplain told pt and family to let nurse know if they need help and that either social work or spiritual care will offer assistance and go over form when pt is ready to have it signed.    02/28/14 1102  Clinical Encounter Type  Visited With Patient and family together  Visit Type Initial  Referral From Nurse;Social work  Consult/Referral To Landscape architect (For Healthcare)  Does patient have an advance directive? No  Would patient like information on creating an advanced directive? Yes - Educational materials given   Vanetta Mulders 02/28/2014 11:05 AM

## 2014-02-28 NOTE — Progress Notes (Signed)
ANTIBIOTIC CONSULT NOTE   Pharmacy Consult for Zosyn Indication: intra-abdominal infection  Allergies  Allergen Reactions  . Simvastatin Other (See Comments)    "Muscle aches"    Patient Measurements: Height: 5\' 9"  (175.3 cm) Weight: 227 lb 4.7 oz (103.1 kg) IBW/kg (Calculated) : 70.7   Vital Signs: Temp: 98.2 F (36.8 C) (10/21 1359) Temp Source: Oral (10/21 1359) BP: 167/66 mmHg (10/21 1359) Pulse Rate: 65 (10/21 1359) Intake/Output from previous day: 10/20 0701 - 10/21 0700 In: 360 [P.O.:360] Out: -  Intake/Output from this shift: Total I/O In: 240 [P.O.:240] Out: -   Labs:  Recent Labs  02/26/14 0419 02/28/14 0040  WBC 26.9* 21.4*  HGB 9.9* 9.4*  PLT 118* 99*  CREATININE 1.37*  --    Estimated Creatinine Clearance: 59.4 ml/min (by C-G formula based on Cr of 1.37). No results found for this basename: VANCOTROUGH, Corlis Leak, VANCORANDOM, GENTTROUGH, GENTPEAK, GENTRANDOM, TOBRATROUGH, TOBRAPEAK, TOBRARND, AMIKACINPEAK, AMIKACINTROU, AMIKACIN,  in the last 72 hours   Microbiology: Recent Results (from the past 720 hour(s))  CLOSTRIDIUM DIFFICILE BY PCR     Status: None   Collection Time    02/22/14  1:54 PM      Result Value Ref Range Status   C difficile by pcr NEGATIVE  NEGATIVE Final   Comment: Performed at [place], BLOOD (ROUTINE X 2)     Status: None   Collection Time    02/22/14  2:43 PM      Result Value Ref Range Status   Specimen Description BLOOD RIGHT HAND   Final   Special Requests BOTTLES DRAWN AEROBIC ONLY 1.5ML   Final   Culture  Setup Time     Final   Value: 02/22/2014 17:03     Performed at [place]     Final   Value: NO GROWTH 5 DAYS     Performed at [facility]   Report Status 02/28/2014 FINAL   Final  CULTURE, BLOOD (ROUTINE X 2)     Status: None   Collection Time    02/22/14  2:43 PM      Result Value Ref Range Status   Specimen Description BLOOD RIGHT HAND   Final   Special Requests BOTTLES DRAWN AEROBIC ONLY 1.5ML   Final   Culture  Setup Time     Final   Value: 02/22/2014 17:04     Performed at [place]     Final   Value: NO GROWTH 5 DAYS     Performed at [facility]   Report Status 02/28/2014 FINAL   Final  URINE CULTURE     Status: None   Collection Time    02/22/14  4:58 PM      Result Value Ref Range Status   Specimen Description URINE, CLEAN CATCH   Final   Special Requests NONE   Final   Culture  Setup Time     Final   Value: 02/23/2014 00:07     Performed at [place]     Final   Value: 50,000 COLONIES/ML     Performed at [facility]   Culture     Final   Value: LACTOBACILLUS SPECIES     Note: Standardized susceptibility testing for this organism is not available.     Performed at [facility]   Report Status 02/24/2014 FINAL   Final  STOOL CULTURE  Status: None   Collection Time    02/22/14  6:10 PM      Result Value Ref Range Status   Specimen Description PERIRECTAL   Final   Special Requests NONE   Final   Culture     Final   Value: NO SALMONELLA, SHIGELLA, CAMPYLOBACTER, YERSINIA, OR E.COLI 0157:H7 ISOLATED     Performed at [facility]   Report Status 02/26/2014 FINAL   Final  MRSA PCR SCREENING     Status: None   Collection Time    02/22/14  9:53 PM      Result Value Ref Range Status   MRSA by PCR NEGATIVE  NEGATIVE Final   Comment:            The GeneXpert MRSA Assay (FDA     approved for NASAL specimens     only), is one component of a     comprehensive MRSA colonization     surveillance program. It is not     intended to diagnose MRSA     infection nor to guide or     monitor treatment for     MRSA infections.    Medical History: Past Medical History  Diagnosis Date  . Hyperlipidemia   . Hypertension   . Ischemic heart disease   . Obesity     Assessment: 46 yoM with PMH of HTN, ischemic heart disease, cirrhosis of  liver. Presented to ED with diarrhea, ascites. Pharmacy consulted to dose Zosyn for intra-abdominal infection (colitis?) with associated sepsis.  Also undergoing workup for liver masses.  10/15 >> Zosyn >> 10/15 >> Flagyl >> 10/19   Tmax: 99.2 WBCs: markedly elevated on admission; now still above ULN but trending down Renal: mild AKI on admit, SCr now trending down;  CrCl 59 ml/min CG (last SCr 10/19)  10/15 blood: NGF x 2 10/15 urine: 50K lactobacillus 10/15 stool cx: no pathogenic organisms isolated 10/15 c.diff PCR: negative 10/15 MRSA screen: neg 10/16 GI pathogen panel: negative   Goal of Therapy:  Appropriate antibiotic dosing for renal function; eradication of infection  Plan:   Continue zosyn 3.375g IV 8H (extended infusion over 4 hours)  Consider transitioning to Augmentin if stable  Reuel Boom, PharmD Pager: [phone] 02/28/2014, 2:26 PM

## 2014-02-28 NOTE — Progress Notes (Signed)
[facility] PROGRESS NOTE  Frank [name] HWE:[id number] DOB: 06/21/1945 DOA: 02/22/2014 PCP: [name], MD  Assessment/Plan  Sepsis secondary to colitis, hypotension resolved with antibiotics and IV fluids -  Continue Zosyn and may transition to Augmentin at discharge -  Colitis may have been triggered by possible underlying colon malignancy  Liver masses, likely represents metastatic disease -  Ultrasound guided biopsy by radiology -  If paracentesis performed, please send for cytology, cell count, culture, SAAG -  Appreciate oncology assistance -  AFP was within normal limits. CEA was 3221 -  No colonoscopy due to the possibility of active colitis at this time, but recommend that he have outpatient colonoscopy once he has completed his antibiotics -  Followup with Doctor Benay Spice as outpatient -  PET scan prior to followup  Acute renal failure, creatinine trending down with IV fluids. Likely has ATN secondary to sepsis -  Avoid NSAIDs, ACEI -  MInimize nephrotoxins -  Renally dose medications -  Repeat CMP in AM  Ischemic heart disease and hypertension, BP elevated -  Increase atenolol to 25mg  BID -  Patient on a statin and would recommend against starting statin given the presence of possible liver metastases -  Resume aspirin once medically stable and cleared by radiology  Leukocytosis secondary to sepsis, but blood cell count has trended down from 41,000-21,000   Microcytic anemia, likely secondary to chronic blood loss in approximately stable from yesterday -  Received blood transfusion on 10/18  Mild to moderate thrombocytopenia, platelets 99 today, approximately stable for the last 3 days -  No evidence of schistocytes on smear, so DIC less likely -  May have some consumption secondary to GI blood loss -  Will need to be trended as outpatient  Diet:  Soft mechanical, n.p.o. at midnight Access:  PIV IVF:  Off  Proph:  SCDs  Code Status: Full  code Family Communication: Patient alone Disposition Plan: Pending diagnostic biopsy of liver, possible paracentesis   Consultants:  Oncology Radiology Procedures:  CT of abdomen and pelvis with contrast Antibiotics:  Metronidazole>>>10/18  Zosyn 10/18 >>  HPI/Subjective:  Patient states that he feels much better. He feels less swollen. Denies shortness of breath, abdominal pain, nausea, vomiting. He is hungry.  Objective: Filed Vitals:   02/27/14 1422 02/27/14 2047 02/28/14 0609 02/28/14 1359  BP: 165/71 186/66 168/75 167/66  Pulse: 63 69 64 65  Temp: 98.2 F (36.8 C) 98.3 F (36.8 C) 98 F (36.7 C) 98.2 F (36.8 C)  TempSrc: Oral Oral Oral Oral  Resp: 18 18 18 18   Height:      Weight:      SpO2: 100% 100% 95% 99%    Intake/Output Summary (Last 24 hours) at 02/28/14 1656 Last data filed at 02/28/14 1100  Gross per 24 hour  Intake    480 ml  Output      0 ml  Net    480 ml   Filed Weights   02/22/14 1728 02/22/14 2100 02/23/14 0403  Weight: 98.884 kg (218 lb) 102.2 kg (225 lb 5 oz) 103.1 kg (227 lb 4.7 oz)    Exam:   General:  White male, no acute distress  HEENT:  NCAT, MMM  Cardiovascular:  IRRR, nl S1, S2 no mrg, 2+ pulses, warm extremities  Respiratory:  CTAB, no increased WOB  Abdomen:   NABS, soft, NT/ND  MSK:   Normal tone and bulk, 2+ pitting edema throughout LEE  Neuro:  Grossly intact  Data  Reviewed: Basic Metabolic Panel:  Recent Labs Lab 02/22/14 1443 02/23/14 0330 02/24/14 1049 02/25/14 0803 02/26/14 0419  NA 143 137 136* 137 138  K 4.6 4.7 3.7 3.8 3.5*  CL 107 104 103 105 106  CO2 15* 18* 17* 17* 18*  GLUCOSE 143* 105* 128* 103* 96  BUN 20 25* 30* 29* 28*  CREATININE 1.09 1.67* 1.76* 1.56* 1.37*  CALCIUM 8.9 7.9* 7.8* 8.0* 8.2*   Liver Function Tests:  Recent Labs Lab 02/22/14 1443 02/23/14 0330  AST 45* 262*  ALT 27 87*  ALKPHOS 256* 202*  BILITOT 1.3* 0.8  PROT 6.6 5.3*  ALBUMIN 3.0* 2.5*    Recent  Labs Lab 02/22/14 1443  LIPASE 69*   No results found for this basename: AMMONIA,  in the last 168 hours CBC:  Recent Labs Lab 02/22/14 1443 02/22/14 1811 02/23/14 0330 02/24/14 1049 02/25/14 0803 02/26/14 0419 02/28/14 0040  WBC 40.2* 45.3* 29.1* 40.5* 29.4* 26.9* 21.4*  NEUTROABS 32.6* 35.8*  --   --   --   --   --   HGB 10.3* 8.1* 7.9* 7.3* 6.9* 9.9* 9.4*  HCT 40.8 30.7* 30.5* 27.0* 26.3* 35.0* 33.0*  MCV 68.5* 66.0* 65.0* 64.9* 65.3* 69.0* 67.6*  PLT PLATELET CLUMPS NOTED ON SMEAR, COUNT APPEARS ADEQUATE 223 173 134* 103* 118* 99*   Cardiac Enzymes:  Recent Labs Lab 02/22/14 1443  TROPONINI <0.30   BNP (last 3 results) No results found for this basename: PROBNP,  in the last 8760 hours CBG: No results found for this basename: GLUCAP,  in the last 168 hours  Recent Results (from the past 240 hour(s))  CLOSTRIDIUM DIFFICILE BY PCR     Status: None   Collection Time    02/22/14  1:54 PM      Result Value Ref Range Status   C difficile by pcr NEGATIVE  NEGATIVE Final   Comment: Performed at [place], BLOOD (ROUTINE X 2)     Status: None   Collection Time    02/22/14  2:43 PM      Result Value Ref Range Status   Specimen Description BLOOD RIGHT HAND   Final   Special Requests BOTTLES DRAWN AEROBIC ONLY 1.5ML   Final   Culture  Setup Time     Final   Value: 02/22/2014 17:03     Performed at [place]     Final   Value: NO GROWTH 5 DAYS     Performed at [facility]   Report Status 02/28/2014 FINAL   Final  CULTURE, BLOOD (ROUTINE X 2)     Status: None   Collection Time    02/22/14  2:43 PM      Result Value Ref Range Status   Specimen Description BLOOD RIGHT HAND   Final   Special Requests BOTTLES DRAWN AEROBIC ONLY 1.5ML   Final   Culture  Setup Time     Final   Value: 02/22/2014 17:04     Performed at [place]     Final   Value: NO GROWTH 5 DAYS     Performed at [facility]   Report Status 02/28/2014 FINAL   Final  URINE CULTURE     Status: None   Collection Time    02/22/14  4:58 PM      Result Value Ref Range Status   Specimen Description URINE, CLEAN CATCH   Final  Special Requests NONE   Final   Culture  Setup Time     Final   Value: 02/23/2014 00:07     Performed at [facility] Count     Final   Value: 50,000 COLONIES/ML     Performed at [facility]   Culture     Final   Value: LACTOBACILLUS SPECIES     Note: Standardized susceptibility testing for this organism is not available.     Performed at [facility]   Report Status 02/24/2014 FINAL   Final  STOOL CULTURE     Status: None   Collection Time    02/22/14  6:10 PM      Result Value Ref Range Status   Specimen Description PERIRECTAL   Final   Special Requests NONE   Final   Culture     Final   Value: NO SALMONELLA, SHIGELLA, CAMPYLOBACTER, YERSINIA, OR E.COLI 0157:H7 ISOLATED     Performed at [facility]   Report Status 02/26/2014 FINAL   Final  MRSA PCR SCREENING     Status: None   Collection Time    02/22/14  9:53 PM      Result Value Ref Range Status   MRSA by PCR NEGATIVE  NEGATIVE Final   Comment:            The GeneXpert MRSA Assay (FDA     approved for NASAL specimens     only), is one component of a     comprehensive MRSA colonization     surveillance program. It is not     intended to diagnose MRSA     infection nor to guide or     monitor treatment for     MRSA infections.     Studies: Mr Liver W Wo Contrast  March 20, 2014   CLINICAL DATA:  Abdominal distention with diarrhea. CT demonstrates cirrhosis with multiple liver lesions. Evaluate for hepatocellular carcinoma. Initial encounter.  EXAM: MRI ABDOMEN WITHOUT AND WITH CONTRAST  TECHNIQUE: Multiplanar multisequence MR imaging of the abdomen was performed both before and after the administration of intravenous contrast.  CONTRAST:  10 ml Eovist.  COMPARISON:   Abdominal pelvic CT 02/22/2014.  FINDINGS: The examination is mildly motion degraded. As demonstrated on CT, there are bilateral pleural effusions with a moderate amount of ascites throughout the abdominal peritoneum. No peritoneal nodularity or abnormal enhancement is identified.  The liver is grossly abnormal with multiple conglomerate masses. Because of the conglomeration, the individual lesions are difficult to measure. There is one in the right hepatic lobe measuring approximately 10.2 cm on image 34 of series 504. One more inferiorly in the right lobe measures 6.2 cm on image 48, and there is a 2.7 cm lesion in the left lobe on image 48. These lesions demonstrate heterogeneous T1 and T2 signal. Post-contrast, there is no significant hyper enhancement on the arterial phase images. The lesions are most conspicuous on the portal phase images on which they enhance less than background liver. The lesions fade on the delayed hepatocyte phase images. There is an approximately 1.8 cm cyst in the left hepatic lobe.  Suggested contour irregularity of the liver may be secondary to the intrinsic lesions and adjacent ascites rather than cirrhosis. The portal, hepatic, superior mesenteric and splenic veins are patent. Calcified gallstones and gallbladder wall thickening are noted. There is no significant biliary dilatation. The spleen, pancreas, adrenal glands and kidneys demonstrate no significant findings. As on CT,  there are mildly prominent lymph nodes within the porta hepatis, nonspecific.  Generalized subcutaneous edema is noted. Patient is status post median sternotomy. No focal bowel lesions are identified.  IMPRESSION: 1. 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 Multifocal hepatocellular carcinoma is considered less likely based on the enhancement pattern, normal serum AFP and markedly elevated serum CEA levels. Occult GI malignancy suspected. Given the wall  thickening of the descending and sigmoid colon on CT, further evaluation with colonoscopy recommended. 2. Bilateral pleural effusions and ascites without abnormal enhancement. 3. Nonspecific mildly prominent lymph nodes within the porta hepatis.   Electronically Signed   By: Camie Patience M.D.   On: 02/27/2014 12:20    Scheduled Meds: . atenolol  12.5 mg Oral BID  . piperacillin-tazobactam (ZOSYN)  IV  3.375 g Intravenous Q8H  . sodium chloride  3 mL Intravenous Q12H   Continuous Infusions:   Active Problems:   Ischemic heart disease   Pure hypercholesterolemia   Benign hypertensive heart disease without heart failure   Sepsis   Colitis   Liver masses   Acute renal failure    Time spent: 30 min    Dejanique Ruehl, [facility] Pager [phone]. If 7PM-7AM, please contact night-coverage at [email or web address], password [facility] 02/28/2014, 4:56 PM  LOS: 6 days

## 2014-02-28 NOTE — H&P (Signed)
Referring Physician(s): Dr. Lindi Adie  Subjective: 68yo male admitted with abd pain and distention. Found to have evidence of numerous heptic lesions concerning for multifocal HCC vs metastasis. He is also noted to have colonic thickening suggestive of colitis but also possible malignancy. Medical oncology has consulted on pt and requests tissue sampling for diagnosis. Pt feels ok at the moment, states his abd discomfort and distention  Has improved over the past few days. Chart, PMHx, meds, labs reviewed  Past Medical History  Diagnosis Date  . Hyperlipidemia   . Hypertension   . Ischemic heart disease   . Obesity    Past Surgical History  Procedure Laterality Date  . Coronary artery bypass graft    . Back surgery     History   Social History  . Marital Status: Married    Spouse Name: N/A    Number of Children: N/A  . Years of Education: N/A   Occupational History  . salesman    Social History Main Topics  . Smoking status: Former Research scientist (life sciences)  . Smokeless tobacco: Not on file  . Alcohol Use: 0.6 oz/week    1 Cans of beer per week     Comment: 2 to 6 beers a week  . Drug Use: No  . Sexual Activity: Not on file   Other Topics Concern  . Not on file   Social History Narrative  . No narrative on file    Allergies: Simvastatin  Medications: Prior to Admission medications   Medication Sig Start Date End Date Taking? Authorizing Provider  aspirin 325 MG tablet Take 325 mg by mouth daily.   Yes Historical Provider, MD  atenolol (TENORMIN) 25 MG tablet Take 12.5 mg by mouth 2 (two) times daily.   Yes Historical Provider, MD  docusate sodium (COLACE) 100 MG capsule Take 200 mg by mouth daily.   Yes Historical Provider, MD  Multiple Vitamin (MULTIVITAMIN WITH MINERALS) TABS tablet Take 1 tablet by mouth daily.   Yes Historical Provider, MD  naproxen sodium (ANAPROX) 220 MG tablet Take 440 mg by mouth daily as needed (pain).    Yes Historical Provider, MD  psyllium  (REGULOID) 0.52 G capsule Take 1.04 g by mouth daily.   Yes Historical Provider, MD  ramipril (ALTACE) 10 MG capsule Take 10 mg by mouth daily.   Yes Historical Provider, MD  tadalafil (CIALIS) 20 MG tablet Take 1 tablet (20 mg total) by mouth as needed. 02/15/13  Yes [name], MD  vitamin C (ASCORBIC ACID) 500 MG tablet Take 500 mg by mouth 2 (two) times daily.   Yes Historical Provider, MD    Review of Systems  Constitutional: Positive for appetite change. Negative for fever and fatigue.  Respiratory: Negative.   Cardiovascular: Negative.   Gastrointestinal: Positive for abdominal pain and abdominal distention.  Genitourinary: Negative.   Skin: Negative for color change and rash.  Neurological: Negative.     Vital Signs: BP 168/75  Pulse 64  Temp(Src) 98 F (36.7 C) (Oral)  Resp 18  Ht 5\' 9"  (1.753 m)  Wt 227 lb 4.7 oz (103.1 kg)  BMI 33.55 kg/m2  SpO2 95%  Physical Exam  Constitutional: He is oriented to person, place, and time. He appears well-developed and well-nourished.  HENT:  Head: Normocephalic.  Neck: Normal range of motion. Neck supple. No tracheal deviation present.  Cardiovascular: Normal rate, regular rhythm and normal heart sounds.   No murmur heard. Pulmonary/Chest: Effort normal and breath sounds normal. No respiratory  distress.  Abdominal: Soft. Bowel sounds are normal. He exhibits distension. He exhibits no mass. There is no tenderness.  Neurological: He is alert and oriented to person, place, and time. No cranial nerve deficit.  Psychiatric: He has a normal mood and affect. His behavior is normal. Judgment normal.    Imaging: Mr Liver W Wo Contrast  02/27/2014   CLINICAL DATA:  Abdominal distention with diarrhea. CT demonstrates cirrhosis with multiple liver lesions. Evaluate for hepatocellular carcinoma. Initial encounter.  EXAM: MRI ABDOMEN WITHOUT AND WITH CONTRAST  TECHNIQUE: Multiplanar multisequence MR imaging of the abdomen was performed  both before and after the administration of intravenous contrast.  CONTRAST:  10 ml Eovist.  COMPARISON:  Abdominal pelvic CT 02/22/2014.  FINDINGS: The examination is mildly motion degraded. As demonstrated on CT, there are bilateral pleural effusions with a moderate amount of ascites throughout the abdominal peritoneum. No peritoneal nodularity or abnormal enhancement is identified.  The liver is grossly abnormal with multiple conglomerate masses. Because of the conglomeration, the individual lesions are difficult to measure. There is one in the right hepatic lobe measuring approximately 10.2 cm on image 34 of series 504. One more inferiorly in the right lobe measures 6.2 cm on image 48, and there is a 2.7 cm lesion in the left lobe on image 48. These lesions demonstrate heterogeneous T1 and T2 signal. Post-contrast, there is no significant hyper enhancement on the arterial phase images. The lesions are most conspicuous on the portal phase images on which they enhance less than background liver. The lesions fade on the delayed hepatocyte phase images. There is an approximately 1.8 cm cyst in the left hepatic lobe.  Suggested contour irregularity of the liver may be secondary to the intrinsic lesions and adjacent ascites rather than cirrhosis. The portal, hepatic, superior mesenteric and splenic veins are patent. Calcified gallstones and gallbladder wall thickening are noted. There is no significant biliary dilatation. The spleen, pancreas, adrenal glands and kidneys demonstrate no significant findings. As on CT, there are mildly prominent lymph nodes within the porta hepatis, nonspecific.  Generalized subcutaneous edema is noted. Patient is status post median sternotomy. No focal bowel lesions are identified.  IMPRESSION: 1. 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 Multifocal hepatocellular carcinoma is considered less likely based on the enhancement pattern,  normal serum AFP and markedly elevated serum CEA levels. Occult GI malignancy suspected. Given the wall thickening of the descending and sigmoid colon on CT, further evaluation with colonoscopy recommended. 2. Bilateral pleural effusions and ascites without abnormal enhancement. 3. Nonspecific mildly prominent lymph nodes within the porta hepatis.   Electronically Signed   By: Camie Patience M.D.   On: 02/27/2014 12:20    Labs:  CBC:  Recent Labs  02/24/14 1049 02/25/14 0803 02/26/14 0419 02/28/14 0040  WBC 40.5* 29.4* 26.9* 21.4*  HGB 7.3* 6.9* 9.9* 9.4*  HCT 27.0* 26.3* 35.0* 33.0*  PLT 134* 103* 118* 99*    COAGS:  Recent Labs  02/22/14 1811  INR 1.28    BMP:  Recent Labs  02/23/14 0330 02/24/14 1049 02/25/14 0803 02/26/14 0419  NA 137 136* 137 138  K 4.7 3.7 3.8 3.5*  CL 104 103 105 106  CO2 18* 17* 17* 18*  GLUCOSE 105* 128* 103* 96  BUN 25* 30* 29* 28*  CALCIUM 7.9* 7.8* 8.0* 8.2*  CREATININE 1.67* 1.76* 1.56* 1.37*  GFRNONAA 40* 37* 43* 51*  GFRAA 46* 43* 50* 59*    LIVER FUNCTION TESTS:  Recent Labs  08/16/13 0745 02/22/14 1443 02/23/14 0330  BILITOT 0.7 1.3* 0.8  AST 20 45* 262*  ALT 16 27 87*  ALKPHOS 87 256* 202*  PROT 6.3 6.6 5.3*  ALBUMIN 3.4* 3.0* 2.5*    Assessment and Plan: Numerous hepatic lesions, possible multifocal HCC vs poss colon ca mets Elevated CEA, Normal AFP Pt also has ascites noted on CT/MR Discussed plan for US guided liver lesion biopsy with probable paracentesis jst prior. Explained procedure, risks, complications, use of sedation Labs reviewed. Consent signed in chart    I spent a total of 30 minutes face to face in clinical consultation/evaluation, greater than 50% of which was counseling/coordinating care for paracentesis and liver mass biopsy.  SignedAscencion Dike 02/28/2014, 12:25 PM

## 2014-03-01 ENCOUNTER — Inpatient Hospital Stay (HOSPITAL_COMMUNITY): Payer: Medicare Other

## 2014-03-01 DIAGNOSIS — I119 Hypertensive heart disease without heart failure: Secondary | ICD-10-CM

## 2014-03-01 LAB — CBC
HCT: 32.4 % — ABNORMAL LOW (ref 39.0–52.0)
Hemoglobin: 9.2 g/dL — ABNORMAL LOW (ref 13.0–17.0)
MCH: 19.5 pg — AB (ref 26.0–34.0)
MCHC: 28.4 g/dL — ABNORMAL LOW (ref 30.0–36.0)
MCV: 68.5 fL — AB (ref 78.0–100.0)
PLATELETS: 85 10*3/uL — AB (ref 150–400)
RBC: 4.73 MIL/uL (ref 4.22–5.81)
RDW: 23.9 % — ABNORMAL HIGH (ref 11.5–15.5)
WBC: 14.7 10*3/uL — ABNORMAL HIGH (ref 4.0–10.5)

## 2014-03-01 LAB — COMPREHENSIVE METABOLIC PANEL
ALK PHOS: 552 U/L — AB (ref 39–117)
ALT: 86 U/L — ABNORMAL HIGH (ref 0–53)
AST: 42 U/L — AB (ref 0–37)
Albumin: 2 g/dL — ABNORMAL LOW (ref 3.5–5.2)
Anion gap: 11 (ref 5–15)
BILIRUBIN TOTAL: 1.8 mg/dL — AB (ref 0.3–1.2)
BUN: 19 mg/dL (ref 6–23)
CHLORIDE: 108 meq/L (ref 96–112)
CO2: 20 meq/L (ref 19–32)
Calcium: 7.9 mg/dL — ABNORMAL LOW (ref 8.4–10.5)
Creatinine, Ser: 1.01 mg/dL (ref 0.50–1.35)
GFR calc Af Amer: 85 mL/min — ABNORMAL LOW (ref >90)
GFR, EST NON AFRICAN AMERICAN: 73 mL/min — AB (ref >90)
Glucose, Bld: 100 mg/dL — ABNORMAL HIGH (ref 70–99)
POTASSIUM: 3.8 meq/L (ref 3.7–5.3)
SODIUM: 139 meq/L (ref 137–147)
Total Protein: 5.4 g/dL — ABNORMAL LOW (ref 6.0–8.3)

## 2014-03-01 LAB — LACTATE DEHYDROGENASE, PLEURAL OR PERITONEAL FLUID: LD FL: 144 U/L — AB (ref 3–23)

## 2014-03-01 LAB — BODY FLUID CELL COUNT WITH DIFFERENTIAL
Eos, Fluid: 0 %
LYMPHS FL: 25 %
MONOCYTE-MACROPHAGE-SEROUS FLUID: 49 % — AB (ref 50–90)
Neutrophil Count, Fluid: 26 % — ABNORMAL HIGH (ref 0–25)
Total Nucleated Cell Count, Fluid: 426 cu mm (ref 0–1000)

## 2014-03-01 LAB — ALBUMIN, FLUID (OTHER): ALBUMIN FL: 1 g/dL

## 2014-03-01 LAB — GLUCOSE, SEROUS FLUID: GLUCOSE FL: 108 mg/dL

## 2014-03-01 LAB — PROTEIN, BODY FLUID: Total protein, fluid: 1.9 g/dL

## 2014-03-01 MED ORDER — FENTANYL CITRATE 0.05 MG/ML IJ SOLN
INTRAMUSCULAR | Status: AC | PRN
  Administered 2014-03-01: 50 ug via INTRAVENOUS

## 2014-03-01 MED ORDER — HYDRALAZINE HCL 20 MG/ML IJ SOLN: 10.0000 mg | INTRAMUSCULAR | Status: DC | PRN

## 2014-03-01 MED ORDER — FLUMAZENIL 0.5 MG/5ML IV SOLN
INTRAVENOUS | Status: AC
  Filled 2014-03-01: qty 5

## 2014-03-01 MED ORDER — RAMIPRIL 10 MG PO CAPS
10.0000 mg | ORAL_CAPSULE | Freq: Every day | ORAL | Status: DC
  Administered 2014-03-01 – 2014-03-02 (×2): 10 mg via ORAL
  Filled 2014-03-01 (×2): qty 1

## 2014-03-01 MED ORDER — MIDAZOLAM HCL 2 MG/2ML IJ SOLN
INTRAMUSCULAR | Status: AC
  Filled 2014-03-01: qty 6

## 2014-03-01 MED ORDER — NALOXONE HCL 0.4 MG/ML IJ SOLN
INTRAMUSCULAR | Status: AC
  Filled 2014-03-01: qty 1

## 2014-03-01 MED ORDER — FENTANYL CITRATE 0.05 MG/ML IJ SOLN
INTRAMUSCULAR | Status: AC
  Filled 2014-03-01: qty 6

## 2014-03-01 MED ORDER — ALBUMIN HUMAN 25 % IV SOLN
50.0000 g | Freq: Once | INTRAVENOUS | Status: AC
  Administered 2014-03-01: 50 g via INTRAVENOUS
  Filled 2014-03-01: qty 200

## 2014-03-01 MED ORDER — MIDAZOLAM HCL 2 MG/2ML IJ SOLN
INTRAMUSCULAR | Status: AC | PRN
  Administered 2014-03-01: 0.5 mg via INTRAVENOUS
  Administered 2014-03-01: 1 mg via INTRAVENOUS

## 2014-03-01 NOTE — Progress Notes (Signed)
Patient 's SBP 200's, MD aware, patient no complaints of headache  Or dizziness, no complaints of any pain or discomfort. S/p post Liver biopsy, post paracentesis. Puncture site, clean dry intact no s/s of bleeding noted. MD seen patient.

## 2014-03-01 NOTE — Procedures (Signed)
Interventional Radiology Procedure Note  Procedure: US guided bx of liver mass.  Multiple masses Complications: None Recommendations: - Bedrest supine x 3 hrs  - Follow biopsy results  Signed,  Dulcy Fanny. Earleen Newport, DO

## 2014-03-01 NOTE — Progress Notes (Signed)
[facility] PROGRESS NOTE  Frank [name] WIO:[id number] DOB: 01-28-1946 DOA: 02/22/2014 PCP: [name], MD  Assessment/Plan  Sepsis secondary to colitis, hypotension resolved with antibiotics and IV fluids -  Continue Zosyn and may transition to Augmentin at discharge -  Colitis may have been triggered by possible underlying colon malignancy  Liver masses, likely represents metastatic disease -  Ultrasound guided biopsy by radiology today  -  AFP was within normal limits. CEA was 3221 -  No colonoscopy due to the possibility of active colitis at this time, but recommend that he have outpatient colonoscopy once he has completed his antibiotics -  Followup with Doctor Benay Spice as outpatient -  PET scan prior to followup  Ascites -  Paracentesis completed today by radiology with 6.3L fluid removed -  Albumin 8gm/liter fluid removed today -  SAAG = 1 which is borderline for portal hypertension -  Does not meet criteria for SBP.   -  Concern for malignant ascites, cytology is pending  Acute renal failure, creatinine trended down with IV fluids. Likely had ATN secondary to sepsis -  Avoid NSAIDs, ACEI  Ischemic heart disease and hypertension, BP very elevated -  Continue atenolol to 25mg  BID -  Resume ramipril  -  Start prn hydralazine -  Patient on a statin and would recommend against starting statin given the presence of possible liver metastases -  Resume aspirin once medically stable and cleared by radiology  Leukocytosis secondary to sepsis, WBC trending down from 41,000   Microcytic anemia, likely secondary to chronic blood loss in approximately stable  -  Received blood transfusion on 10/18  -  Transfuse for hgb < 7 or symptomatic anemia  Mild to moderate thrombocytopenia, platelets gradually drifting down -  No evidence of schistocytes on smear, so DIC less likely -  May have some consumption secondary to GI blood loss -  Will need to be trended as  outpatient, appreciate oncology assistance  Diet:  Soft mechanical diet Access:  PIV IVF:  Off  Proph:  SCDs  Code Status: Full code Family Communication: Patient alone Disposition Plan:  Likely home tomorrow after albumin infusion complete and blood pressure better controlled  Consultants:  Oncology Radiology Procedures:  CT of abdomen and pelvis with contrast 10/22 US paracentesis and liver biopsy  Antibiotics:  Metronidazole>>>10/18  Zosyn 10/18 >>  HPI/Subjective:  Patient states that he feels very well after his paracentesis and liver biopsy today. He denies abdominal pain, nausea, vomiting.  Objective: Filed Vitals:   02/28/14 1359 02/28/14 2324 03/01/14 0550 03/01/14 0817  BP: 167/66 144/73 165/67 86/67  Pulse: 65 66 58 69  Temp: 98.2 F (36.8 C) 98.8 F (37.1 C) 97.7 F (36.5 C)   TempSrc: Oral Oral Oral   Resp: 18 20 20 22   Height:      Weight:      SpO2: 99% 96% 95% 95%    Intake/Output Summary (Last 24 hours) at 03/01/14 0902 Last data filed at 02/28/14 1819  Gross per 24 hour  Intake    340 ml  Output      0 ml  Net    340 ml   Filed Weights   02/22/14 1728 02/22/14 2100 02/23/14 0403  Weight: 98.884 kg (218 lb) 102.2 kg (225 lb 5 oz) 103.1 kg (227 lb 4.7 oz)    Exam:   General:  White male, no acute distress  HEENT:  NCAT, MMM  Cardiovascular:  IRRR, nl S1, S2 no mrg,  2+ pulses, warm extremities  Respiratory:  CTAB, no increased WOB  Abdomen:   NABS, soft, mildly distended, nontender to palpation. Biopsy site without erythema or discharge  MSK:   Normal tone and bulk, 2+ pitting edema throughout LEE  Neuro:  Grossly intact  Data Reviewed: Basic Metabolic Panel:  Recent Labs Lab 02/23/14 0330 02/24/14 1049 02/25/14 0803 02/26/14 0419 03/01/14 0335  NA 137 136* 137 138 139  K 4.7 3.7 3.8 3.5* 3.8  CL 104 103 105 106 108  CO2 18* 17* 17* 18* 20  GLUCOSE 105* 128* 103* 96 100*  BUN 25* 30* 29* 28* 19  CREATININE 1.67*  1.76* 1.56* 1.37* 1.01  CALCIUM 7.9* 7.8* 8.0* 8.2* 7.9*   Liver Function Tests:  Recent Labs Lab 02/22/14 1443 02/23/14 0330 03/01/14 0335  AST 45* 262* 42*  ALT 27 87* 86*  ALKPHOS 256* 202* 552*  BILITOT 1.3* 0.8 1.8*  PROT 6.6 5.3* 5.4*  ALBUMIN 3.0* 2.5* 2.0*    Recent Labs Lab 02/22/14 1443  LIPASE 69*   No results found for this basename: AMMONIA,  in the last 168 hours CBC:  Recent Labs Lab 02/22/14 1443 02/22/14 1811  02/24/14 1049 02/25/14 0803 02/26/14 0419 02/28/14 0040 03/01/14 0335  WBC 40.2* 45.3*  < > 40.5* 29.4* 26.9* 21.4* 14.7*  NEUTROABS 32.6* 35.8*  --   --   --   --   --   --   HGB 10.3* 8.1*  < > 7.3* 6.9* 9.9* 9.4* 9.2*  HCT 40.8 30.7*  < > 27.0* 26.3* 35.0* 33.0* 32.4*  MCV 68.5* 66.0*  < > 64.9* 65.3* 69.0* 67.6* 68.5*  PLT PLATELET CLUMPS NOTED ON SMEAR, COUNT APPEARS ADEQUATE 223  < > 134* 103* 118* 99* 85*  < > = values in this interval not displayed. Cardiac Enzymes:  Recent Labs Lab 02/22/14 1443  TROPONINI <0.30   BNP (last 3 results) No results found for this basename: PROBNP,  in the last 8760 hours CBG: No results found for this basename: GLUCAP,  in the last 168 hours  Recent Results (from the past 240 hour(s))  CLOSTRIDIUM DIFFICILE BY PCR     Status: None   Collection Time    02/22/14  1:54 PM      Result Value Ref Range Status   C difficile by pcr NEGATIVE  NEGATIVE Final   Comment: Performed at [facility], BLOOD (ROUTINE X 2)     Status: None   Collection Time    02/22/14  2:43 PM      Result Value Ref Range Status   Specimen Description BLOOD RIGHT HAND   Final   Special Requests BOTTLES DRAWN AEROBIC ONLY 1.5ML   Final   Culture  Setup Time     Final   Value: 02/22/2014 17:03     Performed at [facility]   Culture     Final   Value: NO GROWTH 5 DAYS     Performed at [facility]   Report Status 02/28/2014 FINAL   Final  CULTURE, BLOOD (ROUTINE X 2)     Status: None    Collection Time    02/22/14  2:43 PM      Result Value Ref Range Status   Specimen Description BLOOD RIGHT HAND   Final   Special Requests BOTTLES DRAWN AEROBIC ONLY 1.5ML   Final   Culture  Setup Time     Final   Value: 02/22/2014 17:04  Performed at [facility]     Final   Value: NO GROWTH 5 DAYS     Performed at [facility]   Report Status 02/28/2014 FINAL   Final  URINE CULTURE     Status: None   Collection Time    02/22/14  4:58 PM      Result Value Ref Range Status   Specimen Description URINE, CLEAN CATCH   Final   Special Requests NONE   Final   Culture  Setup Time     Final   Value: 02/23/2014 00:07     Performed at [facility] Count     Final   Value: 50,000 COLONIES/ML     Performed at [facility]   Culture     Final   Value: LACTOBACILLUS SPECIES     Note: Standardized susceptibility testing for this organism is not available.     Performed at [facility]   Report Status 02/24/2014 FINAL   Final  STOOL CULTURE     Status: None   Collection Time    02/22/14  6:10 PM      Result Value Ref Range Status   Specimen Description PERIRECTAL   Final   Special Requests NONE   Final   Culture     Final   Value: NO SALMONELLA, SHIGELLA, CAMPYLOBACTER, YERSINIA, OR E.COLI 0157:H7 ISOLATED     Performed at [facility]   Report Status 02/26/2014 FINAL   Final  MRSA PCR SCREENING     Status: None   Collection Time    02/22/14  9:53 PM      Result Value Ref Range Status   MRSA by PCR NEGATIVE  NEGATIVE Final   Comment:            The GeneXpert MRSA Assay (FDA     approved for NASAL specimens     only), is one component of a     comprehensive MRSA colonization     surveillance program. It is not     intended to diagnose MRSA     infection nor to guide or     monitor treatment for     MRSA infections.     Studies: Mr Liver W Wo Contrast  March 14, 2014   CLINICAL DATA:  Abdominal distention  with diarrhea. CT demonstrates cirrhosis with multiple liver lesions. Evaluate for hepatocellular carcinoma. Initial encounter.  EXAM: MRI ABDOMEN WITHOUT AND WITH CONTRAST  TECHNIQUE: Multiplanar multisequence MR imaging of the abdomen was performed both before and after the administration of intravenous contrast.  CONTRAST:  10 ml Eovist.  COMPARISON:  Abdominal pelvic CT 02/22/2014.  FINDINGS: The examination is mildly motion degraded. As demonstrated on CT, there are bilateral pleural effusions with a moderate amount of ascites throughout the abdominal peritoneum. No peritoneal nodularity or abnormal enhancement is identified.  The liver is grossly abnormal with multiple conglomerate masses. Because of the conglomeration, the individual lesions are difficult to measure. There is one in the right hepatic lobe measuring approximately 10.2 cm on image 34 of series 504. One more inferiorly in the right lobe measures 6.2 cm on image 48, and there is a 2.7 cm lesion in the left lobe on image 48. These lesions demonstrate heterogeneous T1 and T2 signal. Post-contrast, there is no significant hyper enhancement on the arterial phase images. The lesions are most conspicuous on the portal phase images on which they enhance less than background  liver. The lesions fade on the delayed hepatocyte phase images. There is an approximately 1.8 cm cyst in the left hepatic lobe.  Suggested contour irregularity of the liver may be secondary to the intrinsic lesions and adjacent ascites rather than cirrhosis. The portal, hepatic, superior mesenteric and splenic veins are patent. Calcified gallstones and gallbladder wall thickening are noted. There is no significant biliary dilatation. The spleen, pancreas, adrenal glands and kidneys demonstrate no significant findings. As on CT, there are mildly prominent lymph nodes within the porta hepatis, nonspecific.  Generalized subcutaneous edema is noted. Patient is status post median  sternotomy. No focal bowel lesions are identified.  IMPRESSION: 1. 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 Multifocal hepatocellular carcinoma is considered less likely based on the enhancement pattern, normal serum AFP and markedly elevated serum CEA levels. Occult GI malignancy suspected. Given the wall thickening of the descending and sigmoid colon on CT, further evaluation with colonoscopy recommended. 2. Bilateral pleural effusions and ascites without abnormal enhancement. 3. Nonspecific mildly prominent lymph nodes within the porta hepatis.   Electronically Signed   By: Camie Patience M.D.   On: 02/27/2014 12:20    Scheduled Meds: . atenolol  25 mg Oral BID  . flumazenil      . naloxone      . piperacillin-tazobactam (ZOSYN)  IV  3.375 g Intravenous Q8H  . sodium chloride  3 mL Intravenous Q12H   Continuous Infusions:   Active Problems:   Ischemic heart disease   Pure hypercholesterolemia   Benign hypertensive heart disease without heart failure   Sepsis   Colitis   Liver masses   Acute renal failure    Time spent: 30 min    Shayma Pfefferle, [facility] Pager [phone]. If 7PM-7AM, please contact night-coverage at [email or web address], password [facility] 03/01/2014, 9:02 AM  LOS: 7 days

## 2014-03-01 NOTE — Sedation Documentation (Signed)
Paracentesis complete.  6.3 liters of fluid drained.  Pt without complains.  Will not begin to prep for Liver lesion biopsy.

## 2014-03-01 NOTE — Progress Notes (Signed)
Received post paracentesis/liver biopsy. Puncture site dressing dry and intact, no s/s of bleeding noted.Patient no complaints of any pain or discomfort.

## 2014-03-01 NOTE — Sedation Documentation (Signed)
Pt preped and ready for Liver lesion biopsy.  Dr. Earleen Newport notified.  He is currently performing bone marrow biopsy in CT.  Pt updated and understands.

## 2014-03-01 NOTE — Procedures (Addendum)
Successful US guided paracentesis from RUQ.  Yielded 6.3 liters of serous fluid.  No immediate complications.  Pt tolerated well.   Specimen was sent for labs.  Tsosie Billing D PA-C 03/01/2014 9:12 AM

## 2014-03-01 NOTE — Sedation Documentation (Addendum)
Tsosie Billing, PA-C at bedside to perform paracentesis.  Time out performed by Lelon Huh, RDMS.

## 2014-03-02 DIAGNOSIS — C799 Secondary malignant neoplasm of unspecified site: Secondary | ICD-10-CM

## 2014-03-02 DIAGNOSIS — D72829 Elevated white blood cell count, unspecified: Secondary | ICD-10-CM

## 2014-03-02 DIAGNOSIS — D62 Acute posthemorrhagic anemia: Secondary | ICD-10-CM

## 2014-03-02 DIAGNOSIS — D696 Thrombocytopenia, unspecified: Secondary | ICD-10-CM

## 2014-03-02 LAB — COMPREHENSIVE METABOLIC PANEL
ALT: 49 U/L (ref 0–53)
ANION GAP: 10 (ref 5–15)
AST: 36 U/L (ref 0–37)
Albumin: 2.2 g/dL — ABNORMAL LOW (ref 3.5–5.2)
Alkaline Phosphatase: 507 U/L — ABNORMAL HIGH (ref 39–117)
BUN: 13 mg/dL (ref 6–23)
CALCIUM: 7.6 mg/dL — AB (ref 8.4–10.5)
CO2: 21 mEq/L (ref 19–32)
CREATININE: 1.03 mg/dL (ref 0.50–1.35)
Chloride: 105 mEq/L (ref 96–112)
GFR calc non Af Amer: 72 mL/min — ABNORMAL LOW (ref >90)
GFR, EST AFRICAN AMERICAN: 83 mL/min — AB (ref >90)
GLUCOSE: 81 mg/dL (ref 70–99)
Potassium: 3.6 mEq/L — ABNORMAL LOW (ref 3.7–5.3)
Sodium: 136 mEq/L — ABNORMAL LOW (ref 137–147)
TOTAL PROTEIN: 5.1 g/dL — AB (ref 6.0–8.3)
Total Bilirubin: 2.2 mg/dL — ABNORMAL HIGH (ref 0.3–1.2)

## 2014-03-02 LAB — CBC
HCT: 32.2 % — ABNORMAL LOW (ref 39.0–52.0)
Hemoglobin: 8.9 g/dL — ABNORMAL LOW (ref 13.0–17.0)
MCH: 19.4 pg — AB (ref 26.0–34.0)
MCHC: 27.6 g/dL — ABNORMAL LOW (ref 30.0–36.0)
MCV: 70.3 fL — ABNORMAL LOW (ref 78.0–100.0)
PLATELETS: 75 10*3/uL — AB (ref 150–400)
RBC: 4.58 MIL/uL (ref 4.22–5.81)
RDW: 24 % — ABNORMAL HIGH (ref 11.5–15.5)
WBC: 11.6 10*3/uL — AB (ref 4.0–10.5)

## 2014-03-02 MED ORDER — AMOXICILLIN-POT CLAVULANATE 875-125 MG PO TABS: 1.0000 | ORAL_TABLET | Freq: Two times a day (BID) | ORAL | Status: DC

## 2014-03-02 MED ORDER — FERROUS SULFATE 325 (65 FE) MG PO TBEC: 325.0000 mg | DELAYED_RELEASE_TABLET | Freq: Every day | ORAL | Status: DC

## 2014-03-02 MED ORDER — HYDROCHLOROTHIAZIDE 25 MG PO TABS: 25.0000 mg | ORAL_TABLET | Freq: Every day | ORAL | Status: DC

## 2014-03-02 MED ORDER — ATENOLOL 25 MG PO TABS: 25.0000 mg | ORAL_TABLET | Freq: Two times a day (BID) | ORAL | Status: DC

## 2014-03-02 MED ORDER — OXYCODONE HCL 5 MG PO TABS: 5.0000 mg | ORAL_TABLET | ORAL | Status: DC | PRN

## 2014-03-02 NOTE — Discharge Summary (Signed)
Physician Discharge Summary  Frank [name] DQQ:[id number] DOB: 18-Aug-1945 DOA: 02/22/2014  PCP: [name], MD  Admit date: 02/22/2014 Discharge date: 03/02/2014  Recommendations for Outpatient Follow-up:  1. F/u with Dr. Benay Spice in 1 week.  Will need repeat CBC and BMP 2. Ferrous sulfate 3. Started HCTZ and increased atenolol  Discharge Diagnoses:  Principal Problem:   Sepsis Active Problems:   Ischemic heart disease   Pure hypercholesterolemia   Benign hypertensive heart disease without heart failure   Colitis   Liver masses   Acute renal failure   Metastatic adenocarcinoma   Acute blood loss anemia   Thrombocytopenia   Leukocytosis   Discharge Condition: stable, improved  Diet recommendation: healthy heart  Wt Readings from Last 3 Encounters:  02/23/14 103.1 kg (227 lb 4.7 oz)  09/13/13 104.962 kg (231 lb 6.4 oz)  02/15/13 114.306 kg (252 lb)    History of present illness:  The patient is a 68 year old male with history of coronary artery disease status post CABG in 2000 we will 1, hypertension, dyslipidemia who presented with bloody diarrhea. Per the patient he had possibly 3-4 loose stools per day for the week prior to admission. He then developed vomiting and was seen in the emergency department where he had an elevated white blood cell count, metabolic acidosis and a CT scan of the abdomen and pelvis which was concerning for metastatic cancer. He had multiple liver masses. Per the patient he had lost approximately 50 pounds in the last 2 years intentionally. He denied any previous history of hepatitis B or C. He had noted that he had had some increased lower extremity edema and some increased bloating recently.  Hospital Course:   Sepsis secondary to colitis. He was initially hypotensive and tachycardic. Her leukocytosis. He was started on Zosyn and had clinical improvement with antibiotics and IV fluids. He may be predisposed to colitis secondary to  underlying colonic malignancy. His vital signs have been stable his blood pressure is been elevated over the 24 hours prior to discharge.  He will continue augmentin to complete a 7-day course.    New diagnosis of adenocarcinoma. He underwent ultrasound guided liver biopsy by radiology on 10/22. Preliminary pathology reports demonstrates adenocarcinoma. Suspect that he has a primary colonic source and further stains are being performed which should be reported on Monday 10/26. His AFP was within normal limits and a CEA was 3221. He has not had a recent colonoscopy and this may be arranged as an outpatient. He was seen by oncology who recommended that he undergo a PET scan prior to followup.  Ascites. He underwent paracentesis on 10/22 by radiology and he had 6.3 L of fluid removed.  His SAAG was 1 which is borderline for portal hypertension but does not quite meet criteria. He did not have SBP. There were some atypical cells which were likely reactive, however cytology is pending the pathologist will perform further tests to see if there is any evidence of colonic adenocarcinoma in his ascitic fluid.  Acute renal failure, creatinine trended down IV fluids. Adequate ATN secondary to sepsis. He is advised to avoid NSAIDs and have labs repeated in approximately one week.  Ischemic heart disease and hypertension, his blood pressure was very elevated. He continued his atenolol initially after his blood pressure started to rise but his dose was increased to 25 mg by mouth twice a day. He resumed his ramipril and he was started on HCTZ.  Leukocytosis secondary to sepsis, with blood cultures count  trended down from 41,000-11.6 by the time of discharge.  Microcytic anemia, likely secondary to acute on chronic blood loss and his hemoglobin today 96.9 and 10/18 and he received a blood transfusion. His hemoglobin has remained stable approximately 9 mg per dose later for the last several days.  Recommended that he  started iron supplementation.  Mild to moderate thrombocytopenia, platelets of trended down to 75,000. There were no evidence of schistocytes on smear sent BSE, TTP, HUS or less likely. He may have had some consumption secondary to GI blood loss. He will follow up with hematology next week to have repeat CBC and further violation thrombus cytopenia that already complete.   Consultants:  Oncology  Radiology Procedures:  CT of abdomen and pelvis with contrast  10/22 US paracentesis and liver biopsy  Antibiotics:  Metronidazole>>>10/18  Zosyn 10/18 >>  Discharge Exam: Filed Vitals:   03/02/14 0415  BP: 177/48  Pulse: 60  Temp: 98.3 F (36.8 C)  Resp: 20   Filed Vitals:   03/01/14 1800 03/01/14 1836 03/01/14 2227 03/02/14 0415  BP: 187/69 151/50 150/57 177/48  Pulse: 66 73 63 60  Temp:   99.9 F (37.7 C) 98.3 F (36.8 C)  TempSrc:   Oral Oral  Resp:   20 20  Height:      Weight:      SpO2:  100% 98% 100%    General: White male, no acute distress  HEENT: NCAT, MMM  Cardiovascular: IRRR, nl S1, S2 no mrg, 2+ pulses, warm extremities  Respiratory: CTAB, no increased WOB  Abdomen: NABS, soft, mildly distended, nontender to palpation.  MSK: Normal tone and bulk, 2+ pitting edema throughout LEE  Neuro: Grossly intact   Discharge Instructions      Discharge Instructions   Call MD for:  difficulty breathing, headache or visual disturbances    Complete by:  As directed      Call MD for:  extreme fatigue    Complete by:  As directed      Call MD for:  hives    Complete by:  As directed      Call MD for:  persistant dizziness or light-headedness    Complete by:  As directed      Call MD for:  persistant nausea and vomiting    Complete by:  As directed      Call MD for:  redness, tenderness, or signs of infection (pain, swelling, redness, odor or green/yellow discharge around incision site)    Complete by:  As directed      Call MD for:  severe uncontrolled pain     Complete by:  As directed      Call MD for:  temperature >100.4    Complete by:  As directed      Diet - low sodium heart healthy    Complete by:  As directed      Discharge instructions    Complete by:  As directed   You were hospitalized with bloody stools and were found to have colitis and cancer.  You were transfused blood and should continue to take some iron to rebuild your blood stores.  You should continue augment for two more days, your next dose is due this evening, to complete your antibiotics for infection.  You have had a biopsy of a tumor in your liver and you have adenocarcinoma, a type of cancer.  Dr. Benay Spice will review the final pathology report with you at your appointment next week.  His office will call you with an appointment time and date.  You have a PET scan scheduled for Wednesday 10/28 at noon.  Please come to the [facility] at 11:45AM.  Do not have anything to eat or drink for 6 hours prior to your exam.  For your blood pressure, I have increased your atenolol and recommend that you start HCTZ which may also help your lower extremity swelling.  You will need bloodwork repeated next week by either Dr. Benay Spice or your primary care doctor.     Increase activity slowly    Complete by:  As directed             Medication List    STOP taking these medications       aspirin 325 MG tablet     naproxen sodium 220 MG tablet  Commonly known as:  ANAPROX      TAKE these medications       amoxicillin-clavulanate 875-125 MG per tablet  Commonly known as:  AUGMENTIN  Take 1 tablet by mouth 2 (two) times daily.     atenolol 25 MG tablet  Commonly known as:  TENORMIN  Take 1 tablet (25 mg total) by mouth 2 (two) times daily.     docusate sodium 100 MG capsule  Commonly known as:  COLACE  Take 200 mg by mouth daily.     ferrous sulfate 325 (65 FE) MG EC tablet  Take 1 tablet (325 mg total) by mouth daily with breakfast.     hydrochlorothiazide  25 MG tablet  Commonly known as:  HYDRODIURIL  Take 1 tablet (25 mg total) by mouth daily.     multivitamin with minerals Tabs tablet  Take 1 tablet by mouth daily.     oxyCODONE 5 MG immediate release tablet  Commonly known as:  Oxy IR/ROXICODONE  Take 1 tablet (5 mg total) by mouth every 4 (four) hours as needed for moderate pain or severe pain.     psyllium 0.52 G capsule  Commonly known as:  REGULOID  Take 1.04 g by mouth daily.     ramipril 10 MG capsule  Commonly known as:  ALTACE  Take 10 mg by mouth daily.     tadalafil 20 MG tablet  Commonly known as:  CIALIS  Take 1 tablet (20 mg total) by mouth as needed.     vitamin C 500 MG tablet  Commonly known as:  ASCORBIC ACID  Take 500 mg by mouth 2 (two) times daily.       Follow-up Information   Follow up with [name], MD. Schedule an appointment as soon as possible for a visit in 2 weeks.   Specialty:  Cardiology   Contact information:   [address] [place] [address] [phone]       Follow up with Betsy Coder, MD. Schedule an appointment as soon as possible for a visit in 1 week. (you will be called with appointment time and date)    Specialty:  Oncology   Contact information:   [place] [address] [phone]       Follow up with PET SCAN On 03/07/2014. (Please arrive by 11:45AM.  do not eat or drink anything for 6 hours prior to your test.  )    Contact information:   [facility]       The results of significant diagnostics from this hospitalization (including imaging, microbiology, ancillary and  laboratory) are listed below for reference.    Significant Diagnostic Studies: Dg Chest 1 View  02/22/2014   CLINICAL DATA:  Increasing shortness of breath, diarrhea. Hypertension, prior heart surgery.  EXAM: CHEST - 1 VIEW  COMPARISON:  Chest radiograph report dated September 05, 1999 though images are not available for direct  comparison.  FINDINGS: The cardiac silhouette appears mildly enlarged, even with consideration to this low inspiratory portable examination with crowded vascular markings. Status post median sternotomy for CABG. Mild patchy LEFT lower lobe airspace opacity. The pleural effusions. No pneumothorax. Soft tissue planes and included osseous structures are nonsuspicious.  IMPRESSION: Mild patchy LEFT lower lobe airspace opacity could reflect atelectasis or even pneumonia. Recommend followup chest radiograph after treatment to verify improvement.  Mild cardiomegaly.   Electronically Signed   By: Elon Alas   On: 02/22/2014 17:38   Ct Abdomen Pelvis W Contrast  02/22/2014   CLINICAL DATA:  Patient presents to the ER for evaluation of diarrhea. Patient reports that he has been feeling constipated over the last one or 2 days. He took a laxative at home. Around 10:30 this morning he started having watery diarrhea. Patient has had good control of diarrhea ever since. He reports feeling distended and diffusely cramping in his abdomen. He has not had any fever.  EXAM: CT ABDOMEN AND PELVIS WITH CONTRAST  TECHNIQUE: Multidetector CT imaging of the abdomen and pelvis was performed using the standard protocol following bolus administration of intravenous contrast.  CONTRAST:  186mL OMNIPAQUE IOHEXOL 300 MG/ML  SOLN  COMPARISON:  None.  FINDINGS: There are trace bilateral pleural effusions.  The liver is diminutive in size with nodular contour or consistent with cirrhosis. There are multiple hepatic masses with the largest in the right hepatic lobe measuring 5.8 x 6.8 cm. The largest mass in the left hepatic lobe measures 3.6 x 3.4 cm. There is a 17 mm cyst in the anterior left hepatic lobe. There is no intrahepatic or extrahepatic biliary ductal dilatation. There is cholelithiasis. The spleen demonstrates no focal abnormality. The kidneys, adrenal glands and pancreas are normal. The bladder is unremarkable.  There is  distal transverse colon and descending colon bowel wall thickening and surrounding inflammatory changes most concerning for colitis. There is a moderate amount of abdominal and free fluid. There is a fat containing left inguinal hernia. There is no pneumoperitoneum, pneumatosis, or portal venous gas. There is a mildly enlarged periportal lymph node measuring 12.5 mm.  The abdominal aorta is normal in caliber with atherosclerosis.  There are no lytic or sclerotic osseous lesions. There is severe degenerative disc disease at L5-S1 with a broad-based disc bulge and bilateral facet arthropathy. There are there is a right L5 pars interarticularis defect.  IMPRESSION: 1. 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 The overall appearance is most concerning for multifocal hepatocellular carcinoma versus intrahepatic metastasis arising from primary [facility] versus metastatic disease. 2. There is bowel wall thickening involving the distal transverse and descending colon concerning for colitis secondary to an infectious or inflammatory etiology. Recommend follow-up colonoscopy following resolution of patient's acute illness as colonic malignancy is in the differential diagnosis for the liver findings.   Electronically Signed   By: Kathreen Devoid   On: 02/22/2014 17:30   Mr Liver W Wo Contrast  02/27/2014   CLINICAL DATA:  Abdominal distention with diarrhea. CT demonstrates cirrhosis with multiple liver lesions. Evaluate for hepatocellular carcinoma. Initial encounter.  EXAM: MRI ABDOMEN WITHOUT AND WITH CONTRAST  TECHNIQUE: Multiplanar multisequence MR imaging of the abdomen was performed both before and after the administration of intravenous contrast.  CONTRAST:  10 ml Eovist.  COMPARISON:  Abdominal pelvic CT 02/22/2014.  FINDINGS: The examination is mildly motion degraded. As demonstrated on CT, there are bilateral pleural effusions with a moderate amount of ascites throughout the abdominal peritoneum. No  peritoneal nodularity or abnormal enhancement is identified.  The liver is grossly abnormal with multiple conglomerate masses. Because of the conglomeration, the individual lesions are difficult to measure. There is one in the right hepatic lobe measuring approximately 10.2 cm on image 34 of series 504. One more inferiorly in the right lobe measures 6.2 cm on image 48, and there is a 2.7 cm lesion in the left lobe on image 48. These lesions demonstrate heterogeneous T1 and T2 signal. Post-contrast, there is no significant hyper enhancement on the arterial phase images. The lesions are most conspicuous on the portal phase images on which they enhance less than background liver. The lesions fade on the delayed hepatocyte phase images. There is an approximately 1.8 cm cyst in the left hepatic lobe.  Suggested contour irregularity of the liver may be secondary to the intrinsic lesions and adjacent ascites rather than cirrhosis. The portal, hepatic, superior mesenteric and splenic veins are patent. Calcified gallstones and gallbladder wall thickening are noted. There is no significant biliary dilatation. The spleen, pancreas, adrenal glands and kidneys demonstrate no significant findings. As on CT, there are mildly prominent lymph nodes within the porta hepatis, nonspecific.  Generalized subcutaneous edema is noted. Patient is status post median sternotomy. No focal bowel lesions are identified.  IMPRESSION: 1. 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 Multifocal hepatocellular carcinoma is considered less likely based on the enhancement pattern, normal serum AFP and markedly elevated serum CEA levels. Occult GI malignancy suspected. Given the wall thickening of the descending and sigmoid colon on CT, further evaluation with colonoscopy recommended. 2. Bilateral pleural effusions and ascites without abnormal enhancement. 3. Nonspecific mildly prominent lymph nodes within  the porta hepatis.   Electronically Signed   By: Camie Patience M.D.   On: 02/27/2014 12:20   US Biopsy  03/01/2014   CLINICAL DATA:  68 year old male with multiple liver masses, concern for metastases.  EXAM: ULTRASOUND GUIDED CORE BIOPSY OF LIVER MASS  MEDICATIONS: 1.5 mg IV Versed; 50 mcg IV Fentanyl  Total Moderate Sedation Time: 17  PROCEDURE: The procedure, risks, benefits, and alternatives were explained to the patient. Questions regarding the procedure were encouraged and answered. The patient understands and consents to the procedure.  Ultrasound survey of the upper abdomen was performed with imaging stored and sent to PACs. This was for planning purposes of targeted liver mass biopsy.  The right upper quadrant was prepped with Betadine in a sterile fashion, and a sterile drape was applied covering the operative field. A sterile gown and sterile gloves were used for the procedure. Local anesthesia was provided with 1% Lidocaine.  Using ultrasound guidance the skin and subcutaneous tissues to the level of the liver capsule were infiltrated with 1% lidocaine without epinephrine.  A small stab incision was made with 11 blade scalpel, and using ultrasound guidance, a 17 gauge trocar needle was advanced into the right liver lobe targeting a hyperechoic mass.  The stylet was removed and 318 gauge core biopsy were retrieved.  Two Gel-Foam pledgets were deposited with a small amount of saline.  The patient tolerated the procedure well and remained hemodynamically  stable throughout.  No complications were encountered and no significant blood loss was encounter.  Ultrasound survey at the completion of the study demonstrates no fluid around the liver capsule with gas in the liver secondary to the Gel-Foam pledgets.  COMPLICATIONS: None.  FINDINGS: Ultrasound survey of the right liver lobe demonstrates multiple hyperechoic lesions, compatible with prior MRI.  Images during the case demonstrate placement of trocar  needle into a hyperechoic lesion of the right liver lobe.  Final ultrasound survey after Gel-Foam pledgets and the biopsy demonstrate gas in the liver with posterior acoustic shadowing and no fluid on the liver capsule.  IMPRESSION: Status post ultrasound-guided biopsy of hyperechoic right liver lobe mass, with tissue specimen sent to pathology for complete histopathologic analysis.  Signed,  Dulcy Fanny. Earleen Newport, DO  [facility]  [facility]   Electronically Signed   By: Corrie Mckusick D.O.   On: 03/01/2014 10:24   US Paracentesis  03/01/2014   INDICATION: Liver mass, ascites, request for paracentesis.  EXAM: ULTRASOUND-GUIDED PARACENTESIS  COMPARISON:  None.  MEDICATIONS: None.  COMPLICATIONS: None immediate  TECHNIQUE: Informed written consent was obtained from the patient after a discussion of the risks, benefits and alternatives to treatment. A timeout was performed prior to the initiation of the procedure.  Initial ultrasound scanning demonstrates a large amount of ascites within the right upper abdominal quadrant. The right lower abdomen was prepped and draped in the usual sterile fashion. 1% lidocaine was used for local anesthesia. Under direct ultrasound guidance, a 19 gauge, 10-cm, Yueh catheter was introduced. An ultrasound image was saved for documentation purposed. The paracentesis was performed. The catheter was removed and a dressing was applied. The patient tolerated the procedure well without immediate post procedural complication. Fluid was sent for additional testing.  FINDINGS: A total of approximately 6.3 liters of serous fluid was removed.  IMPRESSION: Successful ultrasound-guided paracentesis yielding 6.3 liters of peritoneal fluid.  Read By:  Tsosie Billing PA-C   Electronically Signed   By: Corrie Mckusick D.O.   On: 03/01/2014 09:14    Microbiology: Recent Results (from the past 240 hour(s))  CLOSTRIDIUM DIFFICILE BY PCR     Status: None    Collection Time    02/22/14  1:54 PM      Result Value Ref Range Status   C difficile by pcr NEGATIVE  NEGATIVE Final   Comment: Performed at [facility], BLOOD (ROUTINE X 2)     Status: None   Collection Time    02/22/14  2:43 PM      Result Value Ref Range Status   Specimen Description BLOOD RIGHT HAND   Final   Special Requests BOTTLES DRAWN AEROBIC ONLY 1.5ML   Final   Culture  Setup Time     Final   Value: 02/22/2014 17:03     Performed at [facility]   Culture     Final   Value: NO GROWTH 5 DAYS     Performed at [facility]   Report Status 02/28/2014 FINAL   Final  CULTURE, BLOOD (ROUTINE X 2)     Status: None   Collection Time    02/22/14  2:43 PM      Result Value Ref Range Status   Specimen Description BLOOD RIGHT HAND   Final   Special Requests BOTTLES DRAWN AEROBIC ONLY 1.5ML   Final   Culture  Setup Time     Final   Value: 02/22/2014  17:04     Performed at [facility]     Final   Value: NO GROWTH 5 DAYS     Performed at [facility]   Report Status 02/28/2014 FINAL   Final  URINE CULTURE     Status: None   Collection Time    02/22/14  4:58 PM      Result Value Ref Range Status   Specimen Description URINE, CLEAN CATCH   Final   Special Requests NONE   Final   Culture  Setup Time     Final   Value: 02/23/2014 00:07     Performed at [facility] Count     Final   Value: 50,000 COLONIES/ML     Performed at [facility]   Culture     Final   Value: LACTOBACILLUS SPECIES     Note: Standardized susceptibility testing for this organism is not available.     Performed at [facility]   Report Status 02/24/2014 FINAL   Final  STOOL CULTURE     Status: None   Collection Time    02/22/14  6:10 PM      Result Value Ref Range Status   Specimen Description PERIRECTAL   Final   Special Requests NONE   Final   Culture     Final   Value: NO SALMONELLA, SHIGELLA,  CAMPYLOBACTER, YERSINIA, OR E.COLI 0157:H7 ISOLATED     Performed at [facility]   Report Status 02/26/2014 FINAL   Final  MRSA PCR SCREENING     Status: None   Collection Time    02/22/14  9:53 PM      Result Value Ref Range Status   MRSA by PCR NEGATIVE  NEGATIVE Final   Comment:            The GeneXpert MRSA Assay (FDA     approved for NASAL specimens     only), is one component of a     comprehensive MRSA colonization     surveillance program. It is not     intended to diagnose MRSA     infection nor to guide or     monitor treatment for     MRSA infections.  BODY FLUID CULTURE     Status: None   Collection Time    03/01/14  9:08 AM      Result Value Ref Range Status   Specimen Description ASCITIC   Final   Special Requests NONE   Final   Gram Stain     Final   Value: NO WBC SEEN     NO ORGANISMS SEEN     Performed at [facility]   Culture     Final   Value: NO GROWTH 1 DAY     Performed at [facility]   Report Status PENDING   Incomplete     Labs: Basic Metabolic Panel:  Recent Labs Lab 02/24/14 1049 02/25/14 0803 02/26/14 0419 03/01/14 0335 03/02/14 0412  NA 136* 137 138 139 136*  K 3.7 3.8 3.5* 3.8 3.6*  CL 103 105 106 108 105  CO2 17* 17* 18* 20 21  GLUCOSE 128* 103* 96 100* 81  BUN 30* 29* 28* 19 13  CREATININE 1.76* 1.56* 1.37* 1.01 1.03  CALCIUM 7.8* 8.0* 8.2* 7.9* 7.6*   Liver Function Tests:  Recent Labs Lab 03/01/14 0335 03/02/14 0412  AST 42* 36  ALT 86*  49  ALKPHOS 552* 507*  BILITOT 1.8* 2.2*  PROT 5.4* 5.1*  ALBUMIN 2.0* 2.2*   No results found for this basename: LIPASE, AMYLASE,  in the last 168 hours No results found for this basename: AMMONIA,  in the last 168 hours CBC:  Recent Labs Lab 02/25/14 0803 02/26/14 0419 02/28/14 0040 03/01/14 0335 03/02/14 0412  WBC 29.4* 26.9* 21.4* 14.7* 11.6*  HGB 6.9* 9.9* 9.4* 9.2* 8.9*  HCT 26.3* 35.0* 33.0* 32.4* 32.2*  MCV 65.3* 69.0* 67.6* 68.5* 70.3*   PLT 103* 118* 99* 85* 75*   Cardiac Enzymes: No results found for this basename: CKTOTAL, CKMB, CKMBINDEX, TROPONINI,  in the last 168 hours BNP: BNP (last 3 results) No results found for this basename: PROBNP,  in the last 8760 hours CBG: No results found for this basename: GLUCAP,  in the last 168 hours  Time coordinating discharge: 30 minutes  Signed:  Antwan Pandya  [facility] 03/02/2014, 1:38 PM

## 2014-03-02 NOTE — Progress Notes (Signed)
Discharge to home, girlfriend at bedside, D/c instructions and follow up appointments done and was given to the patient, verbalized understanding. PIV removed no s/s of infiltration or swelling noted.

## 2014-03-04 LAB — BODY FLUID CULTURE
Culture: NO GROWTH
Gram Stain: NONE SEEN

## 2014-03-06 ENCOUNTER — Telehealth: Payer: Self-pay | Admitting: Oncology

## 2014-03-06 NOTE — Telephone Encounter (Signed)
S/W PATIENT AND GAVE NP APPT FOR 11/03 @ 1:30 W/DR. SHADAD.  [place] DX-METS ADENOCARCINOMA

## 2014-03-06 NOTE — Telephone Encounter (Signed)
C/D FOR 10/27 FOR NP APPT 11/03

## 2014-03-07 ENCOUNTER — Ambulatory Visit (HOSPITAL_COMMUNITY)
Admission: EM | Admit: 2014-03-07 | Discharge: 2014-03-07 | Disposition: A | Discharge status: Home / Self Care | Payer: Medicare Other | Attending: Internal Medicine | Admitting: Internal Medicine

## 2014-03-07 DIAGNOSIS — R609 Edema, unspecified: Secondary | ICD-10-CM

## 2014-03-07 DIAGNOSIS — I119 Hypertensive heart disease without heart failure: Secondary | ICD-10-CM

## 2014-03-07 DIAGNOSIS — C787 Secondary malignant neoplasm of liver and intrahepatic bile duct: Secondary | ICD-10-CM | POA: Diagnosis present

## 2014-03-07 DIAGNOSIS — E7800 Pure hypercholesterolemia, unspecified: Secondary | ICD-10-CM

## 2014-03-07 DIAGNOSIS — C801 Malignant (primary) neoplasm, unspecified: Secondary | ICD-10-CM | POA: Diagnosis not present

## 2014-03-07 DIAGNOSIS — K529 Noninfective gastroenteritis and colitis, unspecified: Secondary | ICD-10-CM

## 2014-03-07 DIAGNOSIS — K4090 Unilateral inguinal hernia, without obstruction or gangrene, not specified as recurrent: Secondary | ICD-10-CM | POA: Diagnosis not present

## 2014-03-07 DIAGNOSIS — I491 Atrial premature depolarization: Secondary | ICD-10-CM

## 2014-03-07 DIAGNOSIS — R197 Diarrhea, unspecified: Secondary | ICD-10-CM

## 2014-03-07 DIAGNOSIS — A419 Sepsis, unspecified organism: Secondary | ICD-10-CM

## 2014-03-07 DIAGNOSIS — K8020 Calculus of gallbladder without cholecystitis without obstruction: Secondary | ICD-10-CM | POA: Diagnosis not present

## 2014-03-07 DIAGNOSIS — R160 Hepatomegaly, not elsewhere classified: Secondary | ICD-10-CM

## 2014-03-07 DIAGNOSIS — I259 Chronic ischemic heart disease, unspecified: Secondary | ICD-10-CM

## 2014-03-07 DIAGNOSIS — C220 Liver cell carcinoma: Secondary | ICD-10-CM

## 2014-03-07 DIAGNOSIS — R188 Other ascites: Secondary | ICD-10-CM

## 2014-03-07 DIAGNOSIS — N179 Acute kidney failure, unspecified: Secondary | ICD-10-CM

## 2014-03-07 DIAGNOSIS — J90 Pleural effusion, not elsewhere classified: Secondary | ICD-10-CM | POA: Diagnosis not present

## 2014-03-07 LAB — GLUCOSE, CAPILLARY: Glucose-Capillary: 99 mg/dL (ref 70–99)

## 2014-03-07 MED ORDER — FLUDEOXYGLUCOSE F - 18 (FDG) INJECTION
10.8000 | Freq: Once | INTRAVENOUS | Status: AC | PRN
  Administered 2014-03-07: 10.8 via INTRAVENOUS

## 2014-03-09 ENCOUNTER — Other Ambulatory Visit (INDEPENDENT_AMBULATORY_CARE_PROVIDER_SITE_OTHER): Payer: Medicare Other | Admitting: *Deleted

## 2014-03-09 ENCOUNTER — Telehealth: Payer: Self-pay | Admitting: *Deleted

## 2014-03-09 DIAGNOSIS — E7800 Pure hypercholesterolemia, unspecified: Secondary | ICD-10-CM

## 2014-03-09 DIAGNOSIS — I259 Chronic ischemic heart disease, unspecified: Secondary | ICD-10-CM

## 2014-03-09 DIAGNOSIS — I119 Hypertensive heart disease without heart failure: Secondary | ICD-10-CM

## 2014-03-09 LAB — BASIC METABOLIC PANEL
BUN: 13 mg/dL (ref 6–23)
CO2: 28 meq/L (ref 19–32)
Calcium: 8.1 mg/dL — ABNORMAL LOW (ref 8.4–10.5)
Chloride: 97 mEq/L (ref 96–112)
Creatinine, Ser: 1.1 mg/dL (ref 0.4–1.5)
GFR: 73.27 mL/min (ref >60.00)
GLUCOSE: 97 mg/dL (ref 70–99)
POTASSIUM: 2.9 meq/L — AB (ref 3.5–5.1)
Sodium: 134 mEq/L — ABNORMAL LOW (ref 135–145)

## 2014-03-09 LAB — HEPATIC FUNCTION PANEL
ALBUMIN: 2.2 g/dL — AB (ref 3.5–5.2)
ALT: 22 U/L (ref 0–53)
AST: 26 U/L (ref 0–37)
Alkaline Phosphatase: 351 U/L — ABNORMAL HIGH (ref 39–117)
Bilirubin, Direct: 0.6 mg/dL — ABNORMAL HIGH (ref 0.0–0.3)
TOTAL PROTEIN: 6.4 g/dL (ref 6.0–8.3)
Total Bilirubin: 1.8 mg/dL — ABNORMAL HIGH (ref 0.2–1.2)

## 2014-03-09 LAB — LIPID PANEL
CHOLESTEROL: 136 mg/dL (ref 0–200)
HDL: 13.4 mg/dL — ABNORMAL LOW (ref >39.00)
LDL Cholesterol: 99 mg/dL (ref 0–99)
NonHDL: 122.6
TRIGLYCERIDES: 117 mg/dL (ref 0.0–149.0)
Total CHOL/HDL Ratio: 10
VLDL: 23.4 mg/dL (ref 0.0–40.0)

## 2014-03-09 MED ORDER — POTASSIUM CHLORIDE CRYS ER 20 MEQ PO TBCR: EXTENDED_RELEASE_TABLET | ORAL | Status: DC

## 2014-03-09 NOTE — Telephone Encounter (Signed)
Discussed low K+ (2.9) with Dr Ron Parker. Patient to get K+ 20 meq and take a total of 80 meq over the next 24 hours and then just 1 tablet daily. Recheck labs at ov next week. Advised patient, verbalized understanding.

## 2014-03-13 ENCOUNTER — Ambulatory Visit: Payer: Medicare Other

## 2014-03-13 ENCOUNTER — Other Ambulatory Visit: Payer: Medicare Other

## 2014-03-13 ENCOUNTER — Telehealth: Payer: Self-pay | Admitting: *Deleted

## 2014-03-13 ENCOUNTER — Telehealth: Payer: Self-pay | Admitting: Oncology

## 2014-03-13 ENCOUNTER — Ambulatory Visit (HOSPITAL_BASED_OUTPATIENT_CLINIC_OR_DEPARTMENT_OTHER): Payer: Medicare Other | Admitting: Oncology

## 2014-03-13 VITALS — BP 164/63 | HR 66 | Temp 98.3°F | Resp 18 | Ht 69.0 in | Wt 213.9 lb

## 2014-03-13 DIAGNOSIS — C799 Secondary malignant neoplasm of unspecified site: Secondary | ICD-10-CM

## 2014-03-13 DIAGNOSIS — C187 Malignant neoplasm of sigmoid colon: Secondary | ICD-10-CM

## 2014-03-13 DIAGNOSIS — C189 Malignant neoplasm of colon, unspecified: Secondary | ICD-10-CM

## 2014-03-13 DIAGNOSIS — C787 Secondary malignant neoplasm of liver and intrahepatic bile duct: Secondary | ICD-10-CM

## 2014-03-13 MED ORDER — OXYCODONE-ACETAMINOPHEN 7.5-325 MG PO TABS: 1.0000 | ORAL_TABLET | ORAL | Status: DC | PRN

## 2014-03-13 NOTE — Telephone Encounter (Signed)
Pt confirmed labs/ov/IR port/ per 11/03 POF, sent msg to add IV iron one hour on 11/06, gave pt AVS.... KJ

## 2014-03-13 NOTE — Progress Notes (Signed)
Updated medication list mailed to patient's home.

## 2014-03-13 NOTE — Progress Notes (Signed)
Reason for Referral: colon cancer.  HPI: 68 year old gentleman currently of [place] where he lived the majority of his life. She gentleman with history of coronary disease and hyperlipidemia but for the most part in general good health. He presented with symptoms of weight loss, diarrhea and hematochezia and was hospitalized during the month of October. His workup revealed liver masses that are suspicious initially of multifocal hepatocellular carcinoma. He subsequently underwent a biopsy which confirmed the presence of adenocarcinoma with immunohistochemical staining supports the diagnosis of colon cancer.a PET scan obtained after his discharge showed sigmoid colon with liver metastasis. During his hospitalization, he was noted to be antiemetic and was discharged on oral iron. He was also noted to have lower extremity edema and hypokalemia and was discharged on oral potassium. Since his discharge, he has felt slightly better but still reports symptoms of arthralgias and myalgias. He also has symptoms of lower extremity edema. He is no longer reporting any diarrhea, not reporting any abdominal pain but certainly still have some occasional hematochezia. Before his hospitalization, he was very active including work part time and plays golf.  He does not report any headaches or blurry vision or syncope. He does not report any fevers or chills or sweats. He did report 50 pound weight loss some of it is intentional. He does not report any chest pain, palpitation or orthopnea. Does not report any shortness of breath or dyspnea on exertion. He does not report any abdominal distention or early satiety. He does not report any frequency, urgency or hesitancy. He does not report any skeletal complaints. He does not report any petechiae or lymphadenopathy. Rest of his review of systems unremarkable.  Past Medical History  Diagnosis Date  . Hyperlipidemia   . Hypertension   . Ischemic heart disease   . Obesity    :  Past Surgical History  Procedure Laterality Date  . Coronary artery bypass graft    . Back surgery    :   Current Outpatient Prescriptions  Medication Sig Dispense Refill  . atenolol (TENORMIN) 25 MG tablet Take 1 tablet (25 mg total) by mouth 2 (two) times daily. 60 tablet 0  . docusate sodium (COLACE) 100 MG capsule Take 200 mg by mouth daily.    . ferrous sulfate 325 (65 FE) MG EC tablet Take 1 tablet (325 mg total) by mouth daily with breakfast. 30 tablet 0  . ferrous sulfate 325 (65 FE) MG tablet Take 325 mg by mouth daily.  0  . hydrochlorothiazide (HYDRODIURIL) 25 MG tablet Take 1 tablet (25 mg total) by mouth daily. 30 tablet 0  . Multiple Vitamin (MULTIVITAMIN WITH MINERALS) TABS tablet Take 1 tablet by mouth daily.    . potassium chloride SA (K-DUR,KLOR-CON) 20 MEQ tablet Patient to take a total of 4 tablets spaced over the next 24 hours and then 1 daily 40 tablet 0  . tadalafil (CIALIS) 20 MG tablet Take 1 tablet (20 mg total) by mouth as needed. 10 tablet prn  . vitamin C (ASCORBIC ACID) 500 MG tablet Take 500 mg by mouth 2 (two) times daily.    Marland Kitchen oxyCODONE-acetaminophen (PERCOCET) 7.5-325 MG per tablet Take 1 tablet by mouth every 4 (four) hours as needed for pain. 30 tablet 0   No current facility-administered medications for this visit.      Allergies  Allergen Reactions  . Simvastatin Other (See Comments)    "Muscle aches"  :  Family History  Problem Relation Age of Onset  . Cancer  Father 28  . Peripheral vascular disease Mother 49    deceased  :  History   Social History  . Marital Status: Married    Spouse Name: N/A    Number of Children: N/A  . Years of Education: N/A   Occupational History  . salesman    Social History Main Topics  . Smoking status: Former Games developer  . Smokeless tobacco: Not on file  . Alcohol Use: 0.6 oz/week    1 Cans of beer per week     Comment: 2 to 6 beers a week  . Drug Use: No  . Sexual Activity: Not on file    Other Topics Concern  . Not on file   Social History Narrative  . No narrative on file  :  Pertinent items are noted in HPI.  Exam: 0 Blood pressure 164/63, pulse 66, temperature 98.3 F (36.8 C), temperature source Oral, resp. rate 18, height 5\' 9"  (1.753 m), weight 213 lb 14.4 oz (97.024 kg), SpO2 98 %. General appearance: alert and cooperative Head: Normocephalic, without obvious abnormality Throat: lips, mucosa, and tongue normal; teeth and gums normal Neck: no adenopathy Back: negative Resp: clear to auscultation bilaterally Chest wall: no tenderness Cardio: regular rate and rhythm, S1, S2 normal, no murmur, click, rub or gallop GI: soft, non-tender; bowel sounds normal; no masses,  no organomegaly Extremity: 2+ lower actually edema  Neurologically intact.    Ct Abdomen Pelvis W Contrast  02/22/2014   CLINICAL DATA:  Patient presents to the ER for evaluation of diarrhea. Patient reports that he has been feeling constipated over the last one or 2 days. He took a laxative at home. Around 10:30 this morning he started having watery diarrhea. Patient has had good control of diarrhea ever since. He reports feeling distended and diffusely cramping in his abdomen. He has not had any fever.  EXAM: CT ABDOMEN AND PELVIS WITH CONTRAST  TECHNIQUE: Multidetector CT imaging of the abdomen and pelvis was performed using the standard protocol following bolus administration of intravenous contrast.  CONTRAST:  02/24/2014 OMNIPAQUE IOHEXOL 300 MG/ML  SOLN  COMPARISON:  None.  FINDINGS: There are trace bilateral pleural effusions.  The liver is diminutive in size with nodular contour or consistent with cirrhosis. There are multiple hepatic masses with the largest in the right hepatic lobe measuring 5.8 x 6.8 cm. The largest mass in the left hepatic lobe measures 3.6 x 3.4 cm. There is a 17 mm cyst in the anterior left hepatic lobe. There is no intrahepatic or extrahepatic biliary ductal dilatation.  There is cholelithiasis. The spleen demonstrates no focal abnormality. The kidneys, adrenal glands and pancreas are normal. The bladder is unremarkable.  There is distal transverse colon and descending colon bowel wall thickening and surrounding inflammatory changes most concerning for colitis. There is a moderate amount of abdominal and free fluid. There is a fat containing left inguinal hernia. There is no pneumoperitoneum, pneumatosis, or portal venous gas. There is a mildly enlarged periportal lymph node measuring 12.5 mm.  The abdominal aorta is normal in caliber with atherosclerosis.  There are no lytic or sclerotic osseous lesions. There is severe degenerative disc disease at L5-S1 with a broad-based disc bulge and bilateral facet arthropathy. There are there is a right L5 pars interarticularis defect.  IMPRESSION: 1. 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 The overall appearance is most concerning for multifocal hepatocellular carcinoma versus intrahepatic metastasis arising from primary [facility] versus metastatic disease. 2. There is  bowel wall thickening involving the distal transverse and descending colon concerning for colitis secondary to an infectious or inflammatory etiology. Recommend follow-up colonoscopy following resolution of patient's acute illness as colonic malignancy is in the differential diagnosis for the liver findings.   Electronically Signed   By: Kathreen Devoid   On: 02/22/2014 17:30   Mr Liver W Wo Contrast  02/27/2014   CLINICAL DATA:  Abdominal distention with diarrhea. CT demonstrates cirrhosis with multiple liver lesions. Evaluate for hepatocellular carcinoma. Initial encounter.  EXAM: MRI ABDOMEN WITHOUT AND WITH CONTRAST  TECHNIQUE: Multiplanar multisequence MR imaging of the abdomen was performed both before and after the administration of intravenous contrast.  CONTRAST:  10 ml Eovist.  COMPARISON:  Abdominal pelvic CT 02/22/2014.  FINDINGS: The examination is  mildly motion degraded. As demonstrated on CT, there are bilateral pleural effusions with a moderate amount of ascites throughout the abdominal peritoneum. No peritoneal nodularity or abnormal enhancement is identified.  The liver is grossly abnormal with multiple conglomerate masses. Because of the conglomeration, the individual lesions are difficult to measure. There is one in the right hepatic lobe measuring approximately 10.2 cm on image 34 of series 504. One more inferiorly in the right lobe measures 6.2 cm on image 48, and there is a 2.7 cm lesion in the left lobe on image 48. These lesions demonstrate heterogeneous T1 and T2 signal. Post-contrast, there is no significant hyper enhancement on the arterial phase images. The lesions are most conspicuous on the portal phase images on which they enhance less than background liver. The lesions fade on the delayed hepatocyte phase images. There is an approximately 1.8 cm cyst in the left hepatic lobe.  Suggested contour irregularity of the liver may be secondary to the intrinsic lesions and adjacent ascites rather than cirrhosis. The portal, hepatic, superior mesenteric and splenic veins are patent. Calcified gallstones and gallbladder wall thickening are noted. There is no significant biliary dilatation. The spleen, pancreas, adrenal glands and kidneys demonstrate no significant findings. As on CT, there are mildly prominent lymph nodes within the porta hepatis, nonspecific.  Generalized subcutaneous edema is noted. Patient is status post median sternotomy. No focal bowel lesions are identified.  IMPRESSION: 1. 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 Multifocal hepatocellular carcinoma is considered less likely based on the enhancement pattern, normal serum AFP and markedly elevated serum CEA levels. Occult GI malignancy suspected. Given the wall thickening of the descending and sigmoid colon on CT, further  evaluation with colonoscopy recommended. 2. Bilateral pleural effusions and ascites without abnormal enhancement. 3. Nonspecific mildly prominent lymph nodes within the porta hepatis.   Electronically Signed   By: Camie Patience M.D.   On: 02/27/2014 12:20   Nm Pet Image Initial (pi) Skull Base To Thigh  03/07/2014   CLINICAL DATA:  Initial treatment strategy for adenocarcinoma with liver metastases, presumed colon primary.  EXAM: NUCLEAR MEDICINE PET SKULL BASE TO THIGH  TECHNIQUE: 10.9 mCi F-18 FDG was injected intravenously. Full-ring PET imaging was performed from the skull base to thigh after the radiotracer. CT data was obtained and used for attenuation correction and anatomic localization.  FASTING BLOOD GLUCOSE:  Value: 99 mg/dl  COMPARISON:  MRI abdomen dated 02/27/2014. CT abdomen pelvis dated 02/22/2014.  FINDINGS: NECK  No hypermetabolic lymph nodes in the neck.  CHEST  Evaluation of lung parenchyma is constrained by respiratory motion. No suspicious pulmonary nodules on the CT scan.  Moderate right and small left pleural effusions. Associated  compressive atelectasis in the right lower lobe.  No hypermetabolic mediastinal or hilar nodes.  ABDOMEN/PELVIS  Multifocal hypermetabolic lesions throughout the liver, corresponding to known metastatic disease. Max SUV in the central liver is 9.3.  No abnormal hypermetabolic activity within the pancreas, adrenal glands, or spleen.  Hypermetabolic apple core lesion involving the sigmoid colon (series 4/image 169), max SUV 17.5, worrisome for primary colonic adenocarcinoma.  Small retroperitoneal nodes. Small upper abdominal nodes, including an 11 mm short axis node in the porta hepatis (series 4/image 116), without convincing hypermetabolism.  Moderate abdominopelvic ascites. Stranding/fluid along the omentum. No gross peritoneal disease/omental caking.  Cholelithiasis, without associated inflammatory changes. Vascular calcifications. Bladder is mildly  thick-walled but underdistended. Small fat containing left inguinal hernia.  SKELETON  Heterogeneous osseous uptake without dominant focal lesion to suggest osseous metastasis.  IMPRESSION: Hypermetabolic lesion involving the sigmoid colon, worrisome for primary colonic adenocarcinoma.  Multifocal hypermetabolic lesions throughout the liver, corresponding to known metastatic disease.  Moderate abdominopelvic ascites. Although suspicious, there is no gross peritoneal disease/omental caking.  Moderate right and small left pleural effusions.   Electronically Signed   By: Julian Hy M.D.   On: 03/07/2014 17:02     Assessment and Plan:   68 year old gentleman with the following issues:  1. Metastatic colon cancer presented with hypermetabolic lesion in the sigmoid colon with liver metastasis. Biopsy proven to confirm the presence of adenocarcinoma of a colorectal primary. His K-ras mutation is currently pending. The natural course of this disease was discussed with the patient and his family. And clearly given the fact that is stage IV disease there is no curative option. Palliative chemotherapy is the treatment of choice which was discussed today extensively. Multi agent chemotherapy in the form of 5-FU, CPT 11 with a Avastin was discussed extensively today. Complications include nausea, vomiting, myelosuppression, diarrhea, neutropenia, neutropenic sepsis, infusion-related complication were discussed today. Complications from Avastin that include hypertension, proteinuria and abdominal perforation were also discussed. The goal of therapy is palliative with extending overall survival hopefully into the double digit months. I will reduce his identity can dose given his baseline liver function abnormalities and thrombocytopenia. He is agreeable to proceed after chemotherapy education class.  2. IV access: Risks and benefits of a Port-A-Cath insertion was discussed. These would include bleeding, thrombosis  and infection.he is agreeable to proceed and I will be done with interventional radiology.  3. Antiemetics: Zofran will be called in before the start of chemotherapy.  4. Primary tumor management: There is no urgency to address his primary tumor unless he becomes symptomatic from it.This would include obstruction or pain.  5. Follow-up: I anticipate the start of chemotherapy will be around November 17 and he'll have a follow-up at that time.

## 2014-03-13 NOTE — Telephone Encounter (Signed)
Labs/ov per 11/03 POF were added to sch and sent msg to add chemo, will mail out pt a copy of schedule.... KJ

## 2014-03-13 NOTE — Telephone Encounter (Signed)
Per staff message and POF I have scheduled appts. Advised scheduler of appts. JMW  

## 2014-03-13 NOTE — Telephone Encounter (Signed)
Lft msg for pt confirming IV Iron for 11/06 per 11/03 POF ....Marland KitchenMarland KitchenMarland Kitchen KJ

## 2014-03-14 ENCOUNTER — Encounter: Payer: Self-pay | Admitting: Cardiology

## 2014-03-14 ENCOUNTER — Telehealth: Payer: Self-pay | Admitting: *Deleted

## 2014-03-14 ENCOUNTER — Ambulatory Visit (INDEPENDENT_AMBULATORY_CARE_PROVIDER_SITE_OTHER): Payer: Medicare Other | Admitting: Cardiology

## 2014-03-14 VITALS — BP 146/68 | HR 68 | Ht 69.0 in | Wt 210.0 lb

## 2014-03-14 DIAGNOSIS — I259 Chronic ischemic heart disease, unspecified: Secondary | ICD-10-CM

## 2014-03-14 DIAGNOSIS — C787 Secondary malignant neoplasm of liver and intrahepatic bile duct: Secondary | ICD-10-CM

## 2014-03-14 DIAGNOSIS — E7800 Pure hypercholesterolemia, unspecified: Secondary | ICD-10-CM

## 2014-03-14 DIAGNOSIS — I119 Hypertensive heart disease without heart failure: Secondary | ICD-10-CM

## 2014-03-14 DIAGNOSIS — C189 Malignant neoplasm of colon, unspecified: Secondary | ICD-10-CM

## 2014-03-14 LAB — BASIC METABOLIC PANEL
BUN: 13 mg/dL (ref 6–23)
CALCIUM: 8.6 mg/dL (ref 8.4–10.5)
CO2: 23 meq/L (ref 19–32)
CREATININE: 1.1 mg/dL (ref 0.4–1.5)
Chloride: 100 mEq/L (ref 96–112)
GFR: 72.48 mL/min (ref >60.00)
Glucose, Bld: 93 mg/dL (ref 70–99)
Potassium: 3.5 mEq/L (ref 3.5–5.1)
Sodium: 137 mEq/L (ref 135–145)

## 2014-03-14 MED ORDER — FUROSEMIDE 40 MG PO TABS: 40.0000 mg | ORAL_TABLET | Freq: Every day | ORAL | Status: DC

## 2014-03-14 MED ORDER — RAMIPRIL 5 MG PO CAPS: 5.0000 mg | ORAL_CAPSULE | Freq: Every day | ORAL | Status: DC

## 2014-03-14 NOTE — Assessment & Plan Note (Addendum)
The patient not had any recurrence of his chest discomfort or angina pectoris.

## 2014-03-14 NOTE — Patient Instructions (Signed)
Will obtain labs today and call you with the results (BMET)  START RAMIPRIL 5 MG DAILY  STOP HYDROCHLOROTHIAZIDE   START LASIX (FUROSEMIDE) 40 MG DAILY  Your physician recommends that you schedule a follow-up appointment in: 2 Month ov/bmet

## 2014-03-14 NOTE — Progress Notes (Signed)
Frank [name] Date of Birth:  1945/09/16 [facility] [address] [place], [place]  [address] [phone]        Fax   [phone]   History of Present Illness: This pleasant 68 year old gentleman is seen for a six-month followup office visit. He has a history of known ischemic heart disease. He had coronary artery bypass graft surgery in 2001. His last Myoview was 03/12/05 and at that time he had an ejection fraction of 50% and no evidence of ischemia. He has a past history of hypercholesterolemia and essential hypertension and exogenous obesity. His last visit he has been doing well. He is trying to lose weight and since we last saw him 6 months ago his weight is down 21 pounds intentionally. He developed severe myalgias and muscle weakness from simvastatin. Originally he was on 80 mg a day and more recently on 40 mg a day but even that caused intolerable symptoms. He has been off his statins. Recent lab work is not satisfactory with an LDL cholesterol of 140. The patient was hospitalized 02/22/14 until 03/02/14 for the diarrhea, sepsis, and was found to have metastatic adenocarcinoma of the colon with multiple liver metastases.  His CEA level was 3221.he had problems with anemia and thrombocytopenia.  He is being seen by Dr. [name] for palliative chemotherapy.  Current Outpatient Prescriptions  Medication Sig Dispense Refill  . atenolol (TENORMIN) 25 MG tablet Take 1 tablet (25 mg total) by mouth 2 (two) times daily. 60 tablet 0  . ferrous sulfate 325 (65 FE) MG EC tablet Take 1 tablet (325 mg total) by mouth daily with breakfast. 30 tablet 0  . Multiple Vitamin (MULTIVITAMIN WITH MINERALS) TABS tablet Take 1 tablet by mouth daily.    Marland Kitchen oxyCODONE-acetaminophen (PERCOCET) 7.5-325 MG per tablet Take 1 tablet by mouth every 4 (four) hours as needed for pain. 30 tablet 0  . potassium chloride SA (K-DUR,KLOR-CON) 20 MEQ tablet Patient to take a total of 4 tablets  spaced over the next 24 hours and then 1 daily 40 tablet 0  . vitamin C (ASCORBIC ACID) 500 MG tablet Take 500 mg by mouth 2 (two) times daily.    Marland Kitchen docusate sodium (COLACE) 100 MG capsule Take 200 mg by mouth daily.    . furosemide (LASIX) 40 MG tablet Take 1 tablet (40 mg total) by mouth daily. 30 tablet 5  . ramipril (ALTACE) 5 MG capsule Take 1 capsule (5 mg total) by mouth daily. 30 capsule 5  . tadalafil (CIALIS) 20 MG tablet Take 1 tablet (20 mg total) by mouth as needed. 10 tablet prn   No current facility-administered medications for this visit.    Allergies  Allergen Reactions  . Simvastatin Other (See Comments)    "Muscle aches"    Patient Active Problem List   Diagnosis Date Noted  . Malignant neoplasm of colon 03/13/2014  . Acute blood loss anemia 03/02/2014  . Thrombocytopenia 03/02/2014  . Leukocytosis 03/02/2014  . Acute renal failure 02/25/2014  . Sepsis 02/22/2014  . Colitis 02/22/2014  . Liver masses 02/22/2014  . Premature atrial contractions 09/13/2013  . Ischemic heart disease 02/15/2013  . Pure hypercholesterolemia 02/15/2013  . Benign hypertensive heart disease without heart failure 02/15/2013    History  Smoking status  . Former Smoker  Smokeless tobacco  . Not on file    History  Alcohol Use  . 0.6 oz/week  . 1 Cans of beer per week  Comment: 2 to 6 beers a week    Family History  Problem Relation Age of Onset  . Cancer Father 68  . Peripheral vascular disease Mother 10    deceased    Review of Systems: Constitutional: no fever chills diaphoresis or fatigue or change in weight.  Head and neck: no hearing loss, no epistaxis, no photophobia or visual disturbance. Respiratory: No cough, shortness of breath or wheezing. Cardiovascular: No chest pain peripheral edema, palpitations. Gastrointestinal: No abdominal distention, no abdominal pain, no change in bowel habits hematochezia or melena. Genitourinary: No dysuria, no frequency, no  urgency, no nocturia. Musculoskeletal:No arthralgias, no back pain, no gait disturbance or myalgias. Neurological: No dizziness, no headaches, no numbness, no seizures, no syncope, no weakness, no tremors. Hematologic: No lymphadenopathy, no easy bruising. Psychiatric: No confusion, no hallucinations, no sleep disturbance.    Physical Exam: Filed Vitals:   03/14/14 0849  BP: 146/68  Pulse: 68  The patient appears to be in no distress. he has lost 21 pounds since last visit and he is slightly pale  Head and neck exam reveals that the pupils are equal and reactive.  The extraocular movements are full.  There is no scleral icterus.  Mouth and pharynx are benign.  No lymphadenopathy.  No carotid bruits.  The jugular venous pressure is normal.  Thyroid is not enlarged or tender.  Chest is clear to percussion and auscultation.  No rales or rhonchi.  Expansion of the chest is symmetrical.  Heart reveals no abnormal lift or heave.  First and second heart sounds are normal.  There is no murmur gallop rub or click.  The abdomen is soft and nontender.  Bowel sounds are normoactive.  There is no hepatosplenomegaly or mass.  There are no abdominal bruits.  Extremities reveal 2+ pretibial and ankle edema.  Pedal pulses are good.  There is no cyanosis or clubbing.  Neurologic exam is normal strength and no lateralizing weakness.  No sensory deficits.  Integument reveals no rash    Assessment / Plan: 1.ischemic heart disease status post coronary artery bypass graft surgery in 2001.  Last Myoview 2006 showing ejection fraction of 50% and no ischemia. 2.  Essential hypertension 3.  Hypercholesterolemia 4.  Adenocarcinoma of the colon metastatic to liver, stage IV, about to start chemotherapy.  Dr. Osker Mason is his oncologist  Continue current medications and restart ramipril 5 mg daily.  Stop HCTZ and start furosemide 40 mg daily.  Continue K-Dur 20 milliequivalents daily.  He will be getting lab  work in 2 weeks to check on electrolytes. Recheck in 2 months for office visit and basal metabolic panel.

## 2014-03-14 NOTE — Assessment & Plan Note (Signed)
Since getting out of the hospital his blood pressure has been running slightly high.  We will resume his ACE inhibitor and a lower dose of ramipril 5 mg daily.  We are stopping his HCTZ and starting Lasix 40 mg daily for better treatment of his peripheral edema.  He will continue to take his potassium 20 mEq daily

## 2014-03-14 NOTE — Assessment & Plan Note (Signed)
The patient has metastatic colon cancer stage IV.  He will be getting a Port-A-Cath next week.

## 2014-03-14 NOTE — Telephone Encounter (Signed)
Per staff message and POF I have scheduled appts. Advised scheduler of appts. JMW  

## 2014-03-16 ENCOUNTER — Other Ambulatory Visit: Payer: Self-pay | Admitting: Radiology

## 2014-03-16 ENCOUNTER — Ambulatory Visit (HOSPITAL_BASED_OUTPATIENT_CLINIC_OR_DEPARTMENT_OTHER): Payer: Medicare Other

## 2014-03-16 DIAGNOSIS — D62 Acute posthemorrhagic anemia: Secondary | ICD-10-CM

## 2014-03-16 DIAGNOSIS — C787 Secondary malignant neoplasm of liver and intrahepatic bile duct: Secondary | ICD-10-CM

## 2014-03-16 DIAGNOSIS — C187 Malignant neoplasm of sigmoid colon: Secondary | ICD-10-CM

## 2014-03-16 MED ORDER — SODIUM CHLORIDE 0.9 % IV SOLN
Freq: Once | INTRAVENOUS | Status: AC
  Administered 2014-03-16: 11:00:00 via INTRAVENOUS

## 2014-03-16 MED ORDER — SODIUM CHLORIDE 0.9 % IV SOLN
1020.0000 mg | Freq: Once | INTRAVENOUS | Status: AC
  Administered 2014-03-16: 1020 mg via INTRAVENOUS
  Filled 2014-03-16: qty 34

## 2014-03-16 NOTE — Patient Instructions (Signed)

## 2014-03-19 ENCOUNTER — Encounter (HOSPITAL_COMMUNITY): Payer: Self-pay

## 2014-03-19 ENCOUNTER — Other Ambulatory Visit: Payer: Medicare Other

## 2014-03-19 ENCOUNTER — Ambulatory Visit (HOSPITAL_COMMUNITY)
Admission: RE | Admit: 2014-03-19 | Discharge: 2014-03-19 | Disposition: A | Discharge status: Home / Self Care | Payer: Medicare Other | Source: Ambulatory Visit | Attending: Oncology | Admitting: Oncology

## 2014-03-19 ENCOUNTER — Other Ambulatory Visit: Payer: Self-pay | Admitting: Oncology

## 2014-03-19 DIAGNOSIS — E669 Obesity, unspecified: Secondary | ICD-10-CM | POA: Diagnosis not present

## 2014-03-19 DIAGNOSIS — C189 Malignant neoplasm of colon, unspecified: Secondary | ICD-10-CM

## 2014-03-19 DIAGNOSIS — E785 Hyperlipidemia, unspecified: Secondary | ICD-10-CM | POA: Insufficient documentation

## 2014-03-19 DIAGNOSIS — Z951 Presence of aortocoronary bypass graft: Secondary | ICD-10-CM | POA: Insufficient documentation

## 2014-03-19 DIAGNOSIS — I10 Essential (primary) hypertension: Secondary | ICD-10-CM | POA: Insufficient documentation

## 2014-03-19 DIAGNOSIS — C799 Secondary malignant neoplasm of unspecified site: Secondary | ICD-10-CM

## 2014-03-19 DIAGNOSIS — Z87891 Personal history of nicotine dependence: Secondary | ICD-10-CM | POA: Insufficient documentation

## 2014-03-19 DIAGNOSIS — I259 Chronic ischemic heart disease, unspecified: Secondary | ICD-10-CM | POA: Insufficient documentation

## 2014-03-19 LAB — CBC
HEMATOCRIT: 33.2 % — AB (ref 39.0–52.0)
HEMOGLOBIN: 9.5 g/dL — AB (ref 13.0–17.0)
MCH: 22.4 pg — AB (ref 26.0–34.0)
MCHC: 28.6 g/dL — ABNORMAL LOW (ref 30.0–36.0)
MCV: 78.3 fL (ref 78.0–100.0)
Platelets: 304 10*3/uL (ref 150–400)
RBC: 4.24 MIL/uL (ref 4.22–5.81)
RDW: 26.3 % — ABNORMAL HIGH (ref 11.5–15.5)
WBC: 13 10*3/uL — ABNORMAL HIGH (ref 4.0–10.5)

## 2014-03-19 LAB — BASIC METABOLIC PANEL
Anion gap: 15 (ref 5–15)
BUN: 15 mg/dL (ref 6–23)
CO2: 24 mEq/L (ref 19–32)
Calcium: 8.9 mg/dL (ref 8.4–10.5)
Chloride: 98 mEq/L (ref 96–112)
Creatinine, Ser: 0.98 mg/dL (ref 0.50–1.35)
GFR calc Af Amer: >90 mL/min (ref >90)
GFR calc non Af Amer: 81 mL/min — ABNORMAL LOW (ref >90)
Glucose, Bld: 92 mg/dL (ref 70–99)
Potassium: 4 mEq/L (ref 3.7–5.3)
Sodium: 137 mEq/L (ref 137–147)

## 2014-03-19 LAB — PROTIME-INR
INR: 1.21 (ref 0.00–1.49)
Prothrombin Time: 15.5 seconds — ABNORMAL HIGH (ref 11.6–15.2)

## 2014-03-19 LAB — APTT: APTT: 35 s (ref 24–37)

## 2014-03-19 MED ORDER — LIDOCAINE-EPINEPHRINE 2 %-1:100000 IJ SOLN
INTRAMUSCULAR | Status: AC
  Filled 2014-03-19: qty 1

## 2014-03-19 MED ORDER — MIDAZOLAM HCL 2 MG/2ML IJ SOLN
INTRAMUSCULAR | Status: AC | PRN
  Administered 2014-03-19 (×3): 1 mg via INTRAVENOUS

## 2014-03-19 MED ORDER — HEPARIN SOD (PORK) LOCK FLUSH 100 UNIT/ML IV SOLN
INTRAVENOUS | Status: AC | PRN
  Administered 2014-03-19: 500 [IU]

## 2014-03-19 MED ORDER — MIDAZOLAM HCL 2 MG/2ML IJ SOLN
INTRAMUSCULAR | Status: AC
  Filled 2014-03-19: qty 6

## 2014-03-19 MED ORDER — LIDOCAINE HCL 1 % IJ SOLN
INTRAMUSCULAR | Status: AC
  Filled 2014-03-19: qty 20

## 2014-03-19 MED ORDER — CEFAZOLIN SODIUM-DEXTROSE 2-3 GM-% IV SOLR
2.0000 g | Freq: Once | INTRAVENOUS | Status: AC
  Administered 2014-03-19: 2 g via INTRAVENOUS

## 2014-03-19 MED ORDER — CEFAZOLIN SODIUM-DEXTROSE 2-3 GM-% IV SOLR
INTRAVENOUS | Status: AC
  Administered 2014-03-19: 2 g via INTRAVENOUS
  Filled 2014-03-19: qty 50

## 2014-03-19 MED ORDER — HEPARIN SOD (PORK) LOCK FLUSH 100 UNIT/ML IV SOLN
INTRAVENOUS | Status: AC
  Filled 2014-03-19: qty 5

## 2014-03-19 MED ORDER — SODIUM CHLORIDE 0.9 % IV SOLN
Freq: Once | INTRAVENOUS | Status: AC
  Administered 2014-03-19: 13:00:00 via INTRAVENOUS

## 2014-03-19 MED ORDER — FENTANYL CITRATE 0.05 MG/ML IJ SOLN
INTRAMUSCULAR | Status: AC | PRN
  Administered 2014-03-19 (×2): 25 ug via INTRAVENOUS
  Administered 2014-03-19: 50 ug via INTRAVENOUS

## 2014-03-19 MED ORDER — FENTANYL CITRATE 0.05 MG/ML IJ SOLN
INTRAMUSCULAR | Status: AC
  Filled 2014-03-19: qty 6

## 2014-03-19 NOTE — H&P (Signed)
Chief Complaint: "I'm here for a port a cath"  Referring Physician(s): Shadad,Firas N  History of Present Illness: Frank [name] is a 68 y.o. male with history of stage IV metastatic colon carcinoma who presents today for port a cath placement for chemotherapy.  Past Medical History  Diagnosis Date  . Hyperlipidemia   . Hypertension   . Ischemic heart disease   . Obesity     Past Surgical History  Procedure Laterality Date  . Coronary artery bypass graft    . Back surgery      Allergies: Simvastatin  Medications: Prior to Admission medications   Medication Sig Start Date End Date Taking? Authorizing Provider  atenolol (TENORMIN) 25 MG tablet Take 1 tablet (25 mg total) by mouth 2 (two) times daily. 03/02/14  Yes Janece Canterbury, MD  docusate sodium (COLACE) 100 MG capsule Take 200 mg by mouth daily.   Yes Historical Provider, MD  ferrous sulfate 325 (65 FE) MG EC tablet Take 1 tablet (325 mg total) by mouth daily with breakfast. 03/02/14  Yes Janece Canterbury, MD  furosemide (LASIX) 40 MG tablet Take 1 tablet (40 mg total) by mouth daily. 03/14/14  Yes [name], MD  Multiple Vitamin (MULTIVITAMIN WITH MINERALS) TABS tablet Take 1 tablet by mouth daily.   Yes Historical Provider, MD  oxyCODONE-acetaminophen (PERCOCET) 7.5-325 MG per tablet Take 1 tablet by mouth every 4 (four) hours as needed for pain. 03/13/14  Yes Wyatt Portela, MD  potassium chloride SA (K-DUR,KLOR-CON) 20 MEQ tablet Patient to take a total of 4 tablets spaced over the next 24 hours and then 1 daily 03/09/14  Yes Carlena Bjornstad, MD  ramipril (ALTACE) 5 MG capsule Take 1 capsule (5 mg total) by mouth daily. 03/14/14  Yes [name], MD  vitamin C (ASCORBIC ACID) 500 MG tablet Take 500 mg by mouth 2 (two) times daily.   Yes Historical Provider, MD  tadalafil (CIALIS) 20 MG tablet Take 1 tablet (20 mg total) by mouth as needed. 02/15/13   [name], MD    Family History  Problem  Relation Age of Onset  . Cancer Father 60  . Peripheral vascular disease Mother 22    deceased    History   Social History  . Marital Status: Married    Spouse Name: N/A    Number of Children: N/A  . Years of Education: N/A   Occupational History  . salesman    Social History Main Topics  . Smoking status: Former Research scientist (life sciences)  . Smokeless tobacco: None  . Alcohol Use: 0.6 oz/week    1 Cans of beer per week     Comment: 2 to 6 beers a week  . Drug Use: No  . Sexual Activity: None   Other Topics Concern  . None   Social History Narrative       Review of Systems  Constitutional: Positive for chills and unexpected weight change. Negative for fever.  Respiratory: Negative for cough and shortness of breath.   Cardiovascular: Negative for chest pain.  Gastrointestinal: Positive for blood in stool. Negative for nausea and vomiting.       Occ abd pain  Genitourinary: Negative for dysuria and hematuria.  Musculoskeletal: Positive for back pain.  Neurological: Negative for headaches.  Hematological: Does not bruise/bleed easily.    Vital Signs: BP 131/53 mmHg  Pulse 67  Temp(Src) 97.4 F (36.3 C) (Oral)  Resp 18  SpO2 98%  Physical Exam  Constitutional: He  is oriented to person, place, and time. He appears well-developed and well-nourished.  Cardiovascular: Normal rate and regular rhythm.   Pulmonary/Chest: Effort normal.  Dim BS bases, right> left  Abdominal: Soft. Bowel sounds are normal. He exhibits distension. There is no tenderness.  Musculoskeletal: Normal range of motion. He exhibits edema.  Neurological: He is alert and oriented to person, place, and time.    Imaging: Dg Chest 1 View  02/22/2014   CLINICAL DATA:  Increasing shortness of breath, diarrhea. Hypertension, prior heart surgery.  EXAM: CHEST - 1 VIEW  COMPARISON:  Chest radiograph report dated September 05, 1999 though images are not available for direct comparison.  FINDINGS: The cardiac silhouette  appears mildly enlarged, even with consideration to this low inspiratory portable examination with crowded vascular markings. Status post median sternotomy for CABG. Mild patchy LEFT lower lobe airspace opacity. The pleural effusions. No pneumothorax. Soft tissue planes and included osseous structures are nonsuspicious.  IMPRESSION: Mild patchy LEFT lower lobe airspace opacity could reflect atelectasis or even pneumonia. Recommend followup chest radiograph after treatment to verify improvement.  Mild cardiomegaly.   Electronically Signed   By: Elon Alas   On: 02/22/2014 17:38   Ct Abdomen Pelvis W Contrast  02/22/2014   CLINICAL DATA:  Patient presents to the ER for evaluation of diarrhea. Patient reports that he has been feeling constipated over the last one or 2 days. He took a laxative at home. Around 10:30 this morning he started having watery diarrhea. Patient has had good control of diarrhea ever since. He reports feeling distended and diffusely cramping in his abdomen. He has not had any fever.  EXAM: CT ABDOMEN AND PELVIS WITH CONTRAST  TECHNIQUE: Multidetector CT imaging of the abdomen and pelvis was performed using the standard protocol following bolus administration of intravenous contrast.  CONTRAST:  112mL OMNIPAQUE IOHEXOL 300 MG/ML  SOLN  COMPARISON:  None.  FINDINGS: There are trace bilateral pleural effusions.  The liver is diminutive in size with nodular contour or consistent with cirrhosis. There are multiple hepatic masses with the largest in the right hepatic lobe measuring 5.8 x 6.8 cm. The largest mass in the left hepatic lobe measures 3.6 x 3.4 cm. There is a 17 mm cyst in the anterior left hepatic lobe. There is no intrahepatic or extrahepatic biliary ductal dilatation. There is cholelithiasis. The spleen demonstrates no focal abnormality. The kidneys, adrenal glands and pancreas are normal. The bladder is unremarkable.  There is distal transverse colon and descending colon bowel  wall thickening and surrounding inflammatory changes most concerning for colitis. There is a moderate amount of abdominal and free fluid. There is a fat containing left inguinal hernia. There is no pneumoperitoneum, pneumatosis, or portal venous gas. There is a mildly enlarged periportal lymph node measuring 12.5 mm.  The abdominal aorta is normal in caliber with atherosclerosis.  There are no lytic or sclerotic osseous lesions. There is severe degenerative disc disease at L5-S1 with a broad-based disc bulge and bilateral facet arthropathy. There are there is a right L5 pars interarticularis defect.  IMPRESSION: 1. 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 The overall appearance is most concerning for multifocal hepatocellular carcinoma versus intrahepatic metastasis arising from primary [facility] versus metastatic disease. 2. There is bowel wall thickening involving the distal transverse and descending colon concerning for colitis secondary to an infectious or inflammatory etiology. Recommend follow-up colonoscopy following resolution of patient's acute illness as colonic malignancy is in the differential diagnosis for the  liver findings.   Electronically Signed   By: Kathreen Devoid   On: 02/22/2014 17:30   Mr Liver W Wo Contrast  02/27/2014   CLINICAL DATA:  Abdominal distention with diarrhea. CT demonstrates cirrhosis with multiple liver lesions. Evaluate for hepatocellular carcinoma. Initial encounter.  EXAM: MRI ABDOMEN WITHOUT AND WITH CONTRAST  TECHNIQUE: Multiplanar multisequence MR imaging of the abdomen was performed both before and after the administration of intravenous contrast.  CONTRAST:  10 ml Eovist.  COMPARISON:  Abdominal pelvic CT 02/22/2014.  FINDINGS: The examination is mildly motion degraded. As demonstrated on CT, there are bilateral pleural effusions with a moderate amount of ascites throughout the abdominal peritoneum. No peritoneal nodularity or abnormal enhancement is  identified.  The liver is grossly abnormal with multiple conglomerate masses. Because of the conglomeration, the individual lesions are difficult to measure. There is one in the right hepatic lobe measuring approximately 10.2 cm on image 34 of series 504. One more inferiorly in the right lobe measures 6.2 cm on image 48, and there is a 2.7 cm lesion in the left lobe on image 48. These lesions demonstrate heterogeneous T1 and T2 signal. Post-contrast, there is no significant hyper enhancement on the arterial phase images. The lesions are most conspicuous on the portal phase images on which they enhance less than background liver. The lesions fade on the delayed hepatocyte phase images. There is an approximately 1.8 cm cyst in the left hepatic lobe.  Suggested contour irregularity of the liver may be secondary to the intrinsic lesions and adjacent ascites rather than cirrhosis. The portal, hepatic, superior mesenteric and splenic veins are patent. Calcified gallstones and gallbladder wall thickening are noted. There is no significant biliary dilatation. The spleen, pancreas, adrenal glands and kidneys demonstrate no significant findings. As on CT, there are mildly prominent lymph nodes within the porta hepatis, nonspecific.  Generalized subcutaneous edema is noted. Patient is status post median sternotomy. No focal bowel lesions are identified.  IMPRESSION: 1. 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 Multifocal hepatocellular carcinoma is considered less likely based on the enhancement pattern, normal serum AFP and markedly elevated serum CEA levels. Occult GI malignancy suspected. Given the wall thickening of the descending and sigmoid colon on CT, further evaluation with colonoscopy recommended. 2. Bilateral pleural effusions and ascites without abnormal enhancement. 3. Nonspecific mildly prominent lymph nodes within the porta hepatis.   Electronically Signed   By:  Camie Patience M.D.   On: 02/27/2014 12:20   Nm Pet Image Initial (pi) Skull Base To Thigh  03/07/2014   CLINICAL DATA:  Initial treatment strategy for adenocarcinoma with liver metastases, presumed colon primary.  EXAM: NUCLEAR MEDICINE PET SKULL BASE TO THIGH  TECHNIQUE: 10.9 mCi F-18 FDG was injected intravenously. Full-ring PET imaging was performed from the skull base to thigh after the radiotracer. CT data was obtained and used for attenuation correction and anatomic localization.  FASTING BLOOD GLUCOSE:  Value: 99 mg/dl  COMPARISON:  MRI abdomen dated 02/27/2014. CT abdomen pelvis dated 02/22/2014.  FINDINGS: NECK  No hypermetabolic lymph nodes in the neck.  CHEST  Evaluation of lung parenchyma is constrained by respiratory motion. No suspicious pulmonary nodules on the CT scan.  Moderate right and small left pleural effusions. Associated compressive atelectasis in the right lower lobe.  No hypermetabolic mediastinal or hilar nodes.  ABDOMEN/PELVIS  Multifocal hypermetabolic lesions throughout the liver, corresponding to known metastatic disease. Max SUV in the central liver is 9.3.  No abnormal  hypermetabolic activity within the pancreas, adrenal glands, or spleen.  Hypermetabolic apple core lesion involving the sigmoid colon (series 4/image 169), max SUV 17.5, worrisome for primary colonic adenocarcinoma.  Small retroperitoneal nodes. Small upper abdominal nodes, including an 11 mm short axis node in the porta hepatis (series 4/image 116), without convincing hypermetabolism.  Moderate abdominopelvic ascites. Stranding/fluid along the omentum. No gross peritoneal disease/omental caking.  Cholelithiasis, without associated inflammatory changes. Vascular calcifications. Bladder is mildly thick-walled but underdistended. Small fat containing left inguinal hernia.  SKELETON  Heterogeneous osseous uptake without dominant focal lesion to suggest osseous metastasis.  IMPRESSION: Hypermetabolic lesion involving  the sigmoid colon, worrisome for primary colonic adenocarcinoma.  Multifocal hypermetabolic lesions throughout the liver, corresponding to known metastatic disease.  Moderate abdominopelvic ascites. Although suspicious, there is no gross peritoneal disease/omental caking.  Moderate right and small left pleural effusions.   Electronically Signed   By: Julian Hy M.D.   On: 03/07/2014 17:02   US Biopsy  03/01/2014   CLINICAL DATA:  68 year old male with multiple liver masses, concern for metastases.  EXAM: ULTRASOUND GUIDED CORE BIOPSY OF LIVER MASS  MEDICATIONS: 1.5 mg IV Versed; 50 mcg IV Fentanyl  Total Moderate Sedation Time: 17  PROCEDURE: The procedure, risks, benefits, and alternatives were explained to the patient. Questions regarding the procedure were encouraged and answered. The patient understands and consents to the procedure.  Ultrasound survey of the upper abdomen was performed with imaging stored and sent to PACs. This was for planning purposes of targeted liver mass biopsy.  The right upper quadrant was prepped with Betadine in a sterile fashion, and a sterile drape was applied covering the operative field. A sterile gown and sterile gloves were used for the procedure. Local anesthesia was provided with 1% Lidocaine.  Using ultrasound guidance the skin and subcutaneous tissues to the level of the liver capsule were infiltrated with 1% lidocaine without epinephrine.  A small stab incision was made with 11 blade scalpel, and using ultrasound guidance, a 17 gauge trocar needle was advanced into the right liver lobe targeting a hyperechoic mass.  The stylet was removed and 318 gauge core biopsy were retrieved.  Two Gel-Foam pledgets were deposited with a small amount of saline.  The patient tolerated the procedure well and remained hemodynamically stable throughout.  No complications were encountered and no significant blood loss was encounter.  Ultrasound survey at the completion of the study  demonstrates no fluid around the liver capsule with gas in the liver secondary to the Gel-Foam pledgets.  COMPLICATIONS: None.  FINDINGS: Ultrasound survey of the right liver lobe demonstrates multiple hyperechoic lesions, compatible with prior MRI.  Images during the case demonstrate placement of trocar needle into a hyperechoic lesion of the right liver lobe.  Final ultrasound survey after Gel-Foam pledgets and the biopsy demonstrate gas in the liver with posterior acoustic shadowing and no fluid on the liver capsule.  IMPRESSION: Status post ultrasound-guided biopsy of hyperechoic right liver lobe mass, with tissue specimen sent to pathology for complete histopathologic analysis.  Signed,  Dulcy Fanny. Earleen Newport, DO  [facility]  [facility]   Electronically Signed   By: Corrie Mckusick D.O.   On: 03/01/2014 10:24   US Paracentesis  03/01/2014   INDICATION: Liver mass, ascites, request for paracentesis.  EXAM: ULTRASOUND-GUIDED PARACENTESIS  COMPARISON:  None.  MEDICATIONS: None.  COMPLICATIONS: None immediate  TECHNIQUE: Informed written consent was obtained from the patient after a discussion of the risks, benefits  and alternatives to treatment. A timeout was performed prior to the initiation of the procedure.  Initial ultrasound scanning demonstrates a large amount of ascites within the right upper abdominal quadrant. The right lower abdomen was prepped and draped in the usual sterile fashion. 1% lidocaine was used for local anesthesia. Under direct ultrasound guidance, a 19 gauge, 10-cm, Yueh catheter was introduced. An ultrasound image was saved for documentation purposed. The paracentesis was performed. The catheter was removed and a dressing was applied. The patient tolerated the procedure well without immediate post procedural complication. Fluid was sent for additional testing.  FINDINGS: A total of approximately 6.3 liters of serous fluid was removed.   IMPRESSION: Successful ultrasound-guided paracentesis yielding 6.3 liters of peritoneal fluid.  Read By:  Tsosie Billing PA-C   Electronically Signed   By: Corrie Mckusick D.O.   On: 03/01/2014 09:14    Labs:  CBC:  Recent Labs  02/28/14 0040 03/01/14 0335 03/02/14 0412 03/19/14 1229  WBC 21.4* 14.7* 11.6* 13.0*  HGB 9.4* 9.2* 8.9* 9.5*  HCT 33.0* 32.4* 32.2* 33.2*  PLT 99* 85* 75* 304    COAGS:  Recent Labs  02/22/14 1811 02/28/14 1709 03/19/14 1229  INR 1.28 1.36 1.21  APTT  --  38* 35    BMP:  Recent Labs  02/26/14 0419 03/01/14 0335 03/02/14 0412 03/09/14 0851 03/14/14 0954 03/19/14 1229  NA 138 139 136* 134* 137 137  K 3.5* 3.8 3.6* 2.9* 3.5 4.0  CL 106 108 105 97 100 98  CO2 18* 20 21 28 23 24   GLUCOSE 96 100* 81 97 93 92  BUN 28* 19 13 13 13 15   CALCIUM 8.2* 7.9* 7.6* 8.1* 8.6 8.9  CREATININE 1.37* 1.01 1.03 1.1 1.1 0.98  GFRNONAA 51* 73* 72*  --   --  81*  GFRAA 59* 85* 83*  --   --  >90    LIVER FUNCTION TESTS:  Recent Labs  02/23/14 0330 03/01/14 0335 03/02/14 0412 03/09/14 0851  BILITOT 0.8 1.8* 2.2* 1.8*  AST 262* 42* 36 26  ALT 87* 86* 49 22  ALKPHOS 202* 552* 507* 351*  PROT 5.3* 5.4* 5.1* 6.4  ALBUMIN 2.5* 2.0* 2.2* 2.2*    TUMOR MARKERS:  Recent Labs  02/23/14 0330 02/25/14 0803 02/26/14 1749  AFPTM 3.8 2.2  --   CEA 3790.2*  --  3221.4*    Assessment and Plan: Frank [name] is a 68 y.o. male with history of stage IV metastatic colon carcinoma who presents today for port a cath placement for chemotherapy. Details/risks of procedure d/w pt with his understanding and consent.   Signed: Autumn Messing 03/19/2014, 1:44 PM   Pt seen & examined.  I concur with the PA note above.  Proceed as planned.   Signed,  Criselda Peaches, MD

## 2014-03-19 NOTE — Procedures (Signed)
Interventional Radiology Procedure Note  Procedure: Placement of a right IJ approach single lumen PowerPort.  Tip is positioned at the superior cavoatrial junction and catheter is ready for immediate use.  Complications: No immediate Recommendations:  - Ok to shower tomorrow - Do not submerge for 7 days - Routine line care   Signed,  Psalms Olarte K. Quintin Hjort, MD   

## 2014-03-19 NOTE — Discharge Instructions (Signed)
Implanted Port Insertion, Care After °Refer to this sheet in the next few weeks. These instructions provide you with information on caring for yourself after your procedure. Your health care provider may also give you more specific instructions. Your treatment has been planned according to current medical practices, but problems sometimes occur. Call your health care provider if you have any problems or questions after your procedure. °WHAT TO EXPECT AFTER THE PROCEDURE °After your procedure, it is typical to have the following:  °· Discomfort at the port insertion site. Ice packs to the area will help. °· Bruising on the skin over the port. This will subside in 3-4 days. °HOME CARE INSTRUCTIONS °· After your port is placed, you will get a manufacturer's information card. The card has information about your port. Keep this card with you at all times.   °· Know what kind of port you have. There are many types of ports available.   °· Wear a medical alert bracelet in case of an emergency. This can help alert health care workers that you have a port.   °· The port can stay in for as long as your health care provider believes it is necessary.   °· A home health care nurse may give medicines and take care of the port.   °· You or a family member can get special training and directions for giving medicine and taking care of the port at home.   °SEEK MEDICAL CARE IF:  °· Your port does not flush or you are unable to get a blood return.   °· You have a fever or chills. °SEEK IMMEDIATE MEDICAL CARE IF: °· You have new fluid or pus coming from your incision.   °· You notice a bad smell coming from your incision site.   °· You have swelling, pain, or more redness at the incision or port site.   °· You have chest pain or shortness of breath. °Document Released: 02/15/2013 Document Revised: 05/02/2013 Document Reviewed: 02/15/2013 °ExitCare® Patient Information ©2015 [employer]. This information is not intended to replace  advice given to you by your health care provider. Make sure you discuss any questions you have with your health care provider. °Implanted Port Home Guide °An implanted port is a type of central line that is placed under the skin. Central lines are used to provide IV access when treatment or nutrition needs to be given through a person's veins. Implanted ports are used for long-term IV access. An implanted port may be placed because:  °· You need IV medicine that would be irritating to the small veins in your hands or arms.   °· You need long-term IV medicines, such as antibiotics.   °· You need IV nutrition for a long period.   °· You need frequent blood draws for lab tests.   °· You need dialysis.   °Implanted ports are usually placed in the chest area, but they can also be placed in the upper arm, the abdomen, or the leg. An implanted port has two main parts:  °· Reservoir. The reservoir is round and will appear as a small, raised area under your skin. The reservoir is the part where a needle is inserted to give medicines or draw blood.   °· Catheter. The catheter is a thin, flexible tube that extends from the reservoir. The catheter is placed into a large vein. Medicine that is inserted into the reservoir goes into the catheter and then into the vein.   °HOW WILL I CARE FOR MY INCISION SITE? °Do not get the incision site wet. Bathe or   shower as directed by your health care provider.  °HOW IS MY PORT ACCESSED? °Special steps must be taken to access the port:  °· Before the port is accessed, a numbing cream can be placed on the skin. This helps numb the skin over the port site.   °· Your health care provider uses a sterile technique to access the port. °· Your health care provider must put on a mask and sterile gloves. °· The skin over your port is cleaned carefully with an antiseptic and allowed to dry. °· The port is gently pinched between sterile gloves, and a needle is inserted into the port. °· Only  "non-coring" port needles should be used to access the port. Once the port is accessed, a blood return should be checked. This helps ensure that the port is in the vein and is not clogged.   °· If your port needs to remain accessed for a constant infusion, a clear (transparent) bandage will be placed over the needle site. The bandage and needle will need to be changed every week, or as directed by your health care provider.   °· Keep the bandage covering the needle clean and dry. Do not get it wet. Follow your health care provider's instructions on how to take a shower or bath while the port is accessed.   °· If your port does not need to stay accessed, no bandage is needed over the port.   °WHAT IS FLUSHING? °Flushing helps keep the port from getting clogged. Follow your health care provider's instructions on how and when to flush the port. Ports are usually flushed with saline solution or a medicine called heparin. The need for flushing will depend on how the port is used.  °· If the port is used for intermittent medicines or blood draws, the port will need to be flushed:   °· After medicines have been given.   °· After blood has been drawn.   °· As part of routine maintenance.   °· If a constant infusion is running, the port may not need to be flushed.   °HOW LONG WILL MY PORT STAY IMPLANTED? °The port can stay in for as long as your health care provider thinks it is needed. When it is time for the port to come out, surgery will be done to remove it. The procedure is similar to the one performed when the port was put in.  °WHEN SHOULD I SEEK IMMEDIATE MEDICAL CARE? °When you have an implanted port, you should seek immediate medical care if:  °· You notice a bad smell coming from the incision site.   °· You have swelling, redness, or drainage at the incision site.   °· You have more swelling or pain at the port site or the surrounding area.   °· You have a fever that is not controlled with medicine. °Document  Released: 04/27/2005 Document Revised: 02/15/2013 Document Reviewed: 01/02/2013 °ExitCare® Patient Information ©2015 [employer]. This information is not intended to replace advice given to you by your health care provider. Make sure you discuss any questions you have with your health care provider.Conscious Sedation, Adult, Care After °Refer to this sheet in the next few weeks. These instructions provide you with information on caring for yourself after your procedure. Your health care provider may also give you more specific instructions. Your treatment has been planned according to current medical practices, but problems sometimes occur. Call your health care provider if you have any problems or questions after your procedure. °WHAT TO EXPECT AFTER THE PROCEDURE  °After your procedure: °·   You may feel sleepy, clumsy, and have poor balance for several hours. °· Vomiting may occur if you eat too soon after the procedure. °HOME CARE INSTRUCTIONS °· Do not participate in any activities where you could become injured for at least 24 hours. Do not: °¨ Drive. °¨ Swim. °¨ Ride a bicycle. °¨ Operate heavy machinery. °¨ Cook. °¨ Use power tools. °¨ Climb ladders. °¨ Work from a high place. °· Do not make important decisions or sign legal documents until you are improved. °· If you vomit, drink water, juice, or soup when you can drink without vomiting. Make sure you have little or no nausea before eating solid foods. °· Only take over-the-counter or prescription medicines for pain, discomfort, or fever as directed by your health care provider. °· Make sure you and your family fully understand everything about the medicines given to you, including what side effects may occur. °· You should not drink alcohol, take sleeping pills, or take medicines that cause drowsiness for at least 24 hours. °· If you smoke, do not smoke without supervision. °· If you are feeling better, you may resume normal activities 24 hours after you  were sedated. °· Keep all appointments with your health care provider. °SEEK MEDICAL CARE IF: °· Your skin is pale or bluish in color. °· You continue to feel nauseous or vomit. °· Your pain is getting worse and is not helped by medicine. °· You have bleeding or swelling. °· You are still sleepy or feeling clumsy after 24 hours. °SEEK IMMEDIATE MEDICAL CARE IF: °· You develop a rash. °· You have difficulty breathing. °· You develop any type of allergic problem. °· You have a fever. °MAKE SURE YOU: °· Understand these instructions. °· Will watch your condition. °· Will get help right away if you are not doing well or get worse. °Document Released: 02/15/2013 Document Reviewed: 02/15/2013 °ExitCare® Patient Information ©2015 [employer]. This information is not intended to replace advice given to you by your health care provider. Make sure you discuss any questions you have with your health care provider. ° °

## 2014-03-22 ENCOUNTER — Other Ambulatory Visit: Payer: Self-pay | Admitting: Medical Oncology

## 2014-03-22 MED ORDER — LIDOCAINE-PRILOCAINE 2.5-2.5 % EX CREA: 1.0000 "application " | TOPICAL_CREAM | CUTANEOUS | Status: DC | PRN

## 2014-03-22 NOTE — Telephone Encounter (Signed)
Patient's partner called to inform office that patient needs prescription for Emla cream for port-a-cath prior to his chemo start date next week.   Per MD, prescription sent to patient's pharmacy. Educated partner on application of emla cream. Encouraged them to call office with further questions/concerns. Denies questions at this time.

## 2014-03-27 ENCOUNTER — Ambulatory Visit (HOSPITAL_BASED_OUTPATIENT_CLINIC_OR_DEPARTMENT_OTHER): Payer: Medicare Other

## 2014-03-27 ENCOUNTER — Encounter: Payer: Self-pay | Admitting: Physician Assistant

## 2014-03-27 ENCOUNTER — Ambulatory Visit (HOSPITAL_BASED_OUTPATIENT_CLINIC_OR_DEPARTMENT_OTHER): Payer: Medicare Other | Admitting: Physician Assistant

## 2014-03-27 ENCOUNTER — Ambulatory Visit (HOSPITAL_BASED_OUTPATIENT_CLINIC_OR_DEPARTMENT_OTHER): Payer: Self-pay | Admitting: Nurse Practitioner

## 2014-03-27 ENCOUNTER — Other Ambulatory Visit (HOSPITAL_BASED_OUTPATIENT_CLINIC_OR_DEPARTMENT_OTHER): Payer: Medicare Other

## 2014-03-27 VITALS — BP 147/59 | HR 73 | Temp 97.9°F | Resp 18

## 2014-03-27 VITALS — BP 136/72 | HR 80 | Temp 97.7°F | Resp 18 | Ht 69.0 in | Wt 186.4 lb

## 2014-03-27 DIAGNOSIS — C787 Secondary malignant neoplasm of liver and intrahepatic bile duct: Secondary | ICD-10-CM

## 2014-03-27 DIAGNOSIS — Z5112 Encounter for antineoplastic immunotherapy: Secondary | ICD-10-CM

## 2014-03-27 DIAGNOSIS — C189 Malignant neoplasm of colon, unspecified: Secondary | ICD-10-CM

## 2014-03-27 DIAGNOSIS — Z95828 Presence of other vascular implants and grafts: Secondary | ICD-10-CM

## 2014-03-27 DIAGNOSIS — T7840XA Allergy, unspecified, initial encounter: Secondary | ICD-10-CM

## 2014-03-27 DIAGNOSIS — C187 Malignant neoplasm of sigmoid colon: Secondary | ICD-10-CM

## 2014-03-27 DIAGNOSIS — Z5111 Encounter for antineoplastic chemotherapy: Secondary | ICD-10-CM

## 2014-03-27 DIAGNOSIS — C799 Secondary malignant neoplasm of unspecified site: Secondary | ICD-10-CM

## 2014-03-27 DIAGNOSIS — R160 Hepatomegaly, not elsewhere classified: Secondary | ICD-10-CM

## 2014-03-27 LAB — COMPREHENSIVE METABOLIC PANEL (CC13)
ALBUMIN: 2.9 g/dL — AB (ref 3.5–5.0)
ALK PHOS: 355 U/L — AB (ref 40–150)
ALT: 20 U/L (ref 0–55)
ANION GAP: 10 meq/L (ref 3–11)
AST: 28 U/L (ref 5–34)
BUN: 16.1 mg/dL (ref 7.0–26.0)
CO2: 25 mEq/L (ref 22–29)
Calcium: 9.1 mg/dL (ref 8.4–10.4)
Chloride: 102 mEq/L (ref 98–109)
Creatinine: 1 mg/dL (ref 0.7–1.3)
GLUCOSE: 136 mg/dL (ref 70–140)
POTASSIUM: 4.1 meq/L (ref 3.5–5.1)
SODIUM: 138 meq/L (ref 136–145)
Total Bilirubin: 0.9 mg/dL (ref 0.20–1.20)
Total Protein: 7.3 g/dL (ref 6.4–8.3)

## 2014-03-27 LAB — CBC WITH DIFFERENTIAL/PLATELET
BASO%: 0.4 % (ref 0.0–2.0)
BASOS ABS: 0 10*3/uL (ref 0.0–0.1)
EOS ABS: 0 10*3/uL (ref 0.0–0.5)
EOS%: 0.6 % (ref 0.0–7.0)
HEMATOCRIT: 33.4 % — AB (ref 38.4–49.9)
HEMOGLOBIN: 10.3 g/dL — AB (ref 13.0–17.1)
LYMPH#: 0.6 10*3/uL — AB (ref 0.9–3.3)
LYMPH%: 7.3 % — ABNORMAL LOW (ref 14.0–49.0)
MCH: 24.8 pg — AB (ref 27.2–33.4)
MCHC: 30.7 g/dL — ABNORMAL LOW (ref 32.0–36.0)
MCV: 80.9 fL (ref 79.3–98.0)
MONO#: 2.7 10*3/uL — ABNORMAL HIGH (ref 0.1–0.9)
MONO%: 32.9 % — ABNORMAL HIGH (ref 0.0–14.0)
NEUT%: 58.8 % (ref 39.0–75.0)
NEUTROS ABS: 4.9 10*3/uL (ref 1.5–6.5)
Platelets: 150 10*3/uL (ref 140–400)
RBC: 4.13 10*6/uL — ABNORMAL LOW (ref 4.20–5.82)
RDW: 30.1 % — ABNORMAL HIGH (ref 11.0–14.6)
WBC: 8.4 10*3/uL (ref 4.0–10.3)
nRBC: 0 % (ref 0–0)

## 2014-03-27 LAB — UA PROTEIN, DIPSTICK - CHCC: Protein, ur: 30 mg/dL

## 2014-03-27 LAB — TECHNOLOGIST REVIEW

## 2014-03-27 MED ORDER — PROCHLORPERAZINE MALEATE 10 MG PO TABS: 10.0000 mg | ORAL_TABLET | Freq: Four times a day (QID) | ORAL | Status: DC | PRN

## 2014-03-27 MED ORDER — SODIUM CHLORIDE 0.9 % IV SOLN
5.0000 mg/kg | Freq: Once | INTRAVENOUS | Status: AC
  Administered 2014-03-27: 475 mg via INTRAVENOUS
  Filled 2014-03-27: qty 19

## 2014-03-27 MED ORDER — SODIUM CHLORIDE 0.9 % IV SOLN
Freq: Once | INTRAVENOUS | Status: AC
  Administered 2014-03-27: 12:00:00 via INTRAVENOUS

## 2014-03-27 MED ORDER — SODIUM CHLORIDE 0.9 % IJ SOLN
10.0000 mL | INTRAMUSCULAR | Status: DC | PRN
  Administered 2014-03-27: 10 mL via INTRAVENOUS
  Filled 2014-03-27: qty 10

## 2014-03-27 MED ORDER — SODIUM CHLORIDE 0.9 % IV SOLN
2400.0000 mg/m2 | INTRAVENOUS | Status: DC
  Administered 2014-03-27: 5200 mg via INTRAVENOUS
  Filled 2014-03-27: qty 104

## 2014-03-27 MED ORDER — FLUOROURACIL CHEMO INJECTION 2.5 GM/50ML
400.0000 mg/m2 | Freq: Once | INTRAVENOUS | Status: AC
  Administered 2014-03-27: 850 mg via INTRAVENOUS
  Filled 2014-03-27: qty 17

## 2014-03-27 MED ORDER — DEXAMETHASONE SODIUM PHOSPHATE 20 MG/5ML IJ SOLN
INTRAMUSCULAR | Status: AC
  Filled 2014-03-27: qty 5

## 2014-03-27 MED ORDER — FAMOTIDINE IN NACL 20-0.9 MG/50ML-% IV SOLN
20.0000 mg | Freq: Once | INTRAVENOUS | Status: AC | PRN
  Administered 2014-03-27: 20 mg via INTRAVENOUS

## 2014-03-27 MED ORDER — DEXAMETHASONE SODIUM PHOSPHATE 20 MG/5ML IJ SOLN
20.0000 mg | Freq: Once | INTRAMUSCULAR | Status: AC
  Administered 2014-03-27: 20 mg via INTRAVENOUS

## 2014-03-27 MED ORDER — ATROPINE SULFATE 1 MG/ML IJ SOLN
INTRAMUSCULAR | Status: AC
  Filled 2014-03-27: qty 1

## 2014-03-27 MED ORDER — ONDANSETRON 16 MG/50ML IVPB (CHCC)
INTRAVENOUS | Status: AC
  Filled 2014-03-27: qty 16

## 2014-03-27 MED ORDER — DIPHENHYDRAMINE HCL 50 MG/ML IJ SOLN
25.0000 mg | Freq: Once | INTRAMUSCULAR | Status: AC | PRN
  Administered 2014-03-27: 25 mg via INTRAVENOUS

## 2014-03-27 MED ORDER — DIPHENHYDRAMINE HCL 50 MG/ML IJ SOLN
INTRAMUSCULAR | Status: AC
  Filled 2014-03-27: qty 1

## 2014-03-27 MED ORDER — FAMOTIDINE IN NACL 20-0.9 MG/50ML-% IV SOLN
INTRAVENOUS | Status: AC
  Filled 2014-03-27: qty 50

## 2014-03-27 MED ORDER — DEXTROSE 5 % IV SOLN
129.0000 mg/m2 | Freq: Once | INTRAVENOUS | Status: AC
  Administered 2014-03-27: 280 mg via INTRAVENOUS
  Filled 2014-03-27: qty 14

## 2014-03-27 MED ORDER — LEUCOVORIN CALCIUM INJECTION 350 MG
400.0000 mg/m2 | Freq: Once | INTRAVENOUS | Status: AC
  Administered 2014-03-27: 868 mg via INTRAVENOUS
  Filled 2014-03-27: qty 43.4

## 2014-03-27 MED ORDER — ONDANSETRON 16 MG/50ML IVPB (CHCC)
16.0000 mg | Freq: Once | INTRAVENOUS | Status: AC
  Administered 2014-03-27: 16 mg via INTRAVENOUS

## 2014-03-27 MED ORDER — ATROPINE SULFATE 1 MG/ML IJ SOLN
0.5000 mg | Freq: Once | INTRAMUSCULAR | Status: AC | PRN
  Administered 2014-03-27: 0.5 mg via INTRAVENOUS

## 2014-03-27 NOTE — Patient Instructions (Addendum)
[name] Discharge Instructions for Patients Receiving Chemotherapy  Today you received the following chemotherapy agents: Avastin, Irinotecan, Leucovorin, 5FU  To help prevent nausea and vomiting after your treatment, we encourage you to take your nausea medication as prescribed. You may take compazine 10 mg by mouth every 6 hours as needed for nausea, starting now. This medication may cause drowsiness.    If you develop nausea and vomiting that is not controlled by your nausea medication, call the clinic.   BELOW ARE SYMPTOMS THAT SHOULD BE REPORTED IMMEDIATELY:  *FEVER GREATER THAN 100.5 F  *CHILLS WITH OR WITHOUT FEVER  NAUSEA AND VOMITING THAT IS NOT CONTROLLED WITH YOUR NAUSEA MEDICATION  *UNUSUAL SHORTNESS OF BREATH  *UNUSUAL BRUISING OR BLEEDING  TENDERNESS IN MOUTH AND THROAT WITH OR WITHOUT PRESENCE OF ULCERS  *URINARY PROBLEMS  *BOWEL PROBLEMS  UNUSUAL RASH Items with * indicate a potential emergency and should be followed up as soon as possible.  Feel free to call the clinic you have any questions or concerns. The clinic phone number is [phone].

## 2014-03-27 NOTE — Patient Instructions (Signed)

## 2014-03-27 NOTE — Progress Notes (Signed)
Hematology Oncology Follow Up Visit   HPI: 68 year old gentleman with recently diagnosed metastatic colon cancer. He presents to proceed with day 1 of cycle #1 of systemic chemotherapy with FOLFIRI. He had a Port a Cath placed by Interventional Radiology on 03/19/2014. He requests that his labs be drawn from his Port-A-Cath on chemotherapy days. He states that he gets hungry but food does not taste good to him right now. He continues to reports symptoms of arthralgias and myalgias. He also has symptoms of lower extremity edema. He is no longer reporting any diarrhea, not reporting any abdominal pain but certainly still have some occasional hematochezia. He does not report any headaches or blurry vision or syncope. He does not report any fevers or chills or sweats.  Does not report any shortness of breath or dyspnea on exertion. He does not report any abdominal distention or early satiety. He does not report any frequency, urgency or hesitancy. He does not report any skeletal complaints. He does not report any petechiae or lymphadenopathy. Remainder of his review of systems unremarkable.  Past Medical History  Diagnosis Date  . Hyperlipidemia   . Hypertension   . Ischemic heart disease   . Obesity   :  Past Surgical History  Procedure Laterality Date  . Coronary artery bypass graft    . Back surgery    :   Current Outpatient Prescriptions  Medication Sig Dispense Refill  . atenolol (TENORMIN) 25 MG tablet Take 1 tablet (25 mg total) by mouth 2 (two) times daily. 60 tablet 0  . docusate sodium (COLACE) 100 MG capsule Take 200 mg by mouth daily.    . ferrous sulfate 325 (65 FE) MG EC tablet Take 1 tablet (325 mg total) by mouth daily with breakfast. 30 tablet 0  . ferrous sulfate 325 (65 FE) MG tablet Take 325 mg by mouth daily.  0  . hydrochlorothiazide (HYDRODIURIL) 25 MG tablet Take 1 tablet (25 mg total) by mouth daily. 30 tablet 0  . Multiple Vitamin (MULTIVITAMIN WITH MINERALS) TABS  tablet Take 1 tablet by mouth daily.    . potassium chloride SA (K-DUR,KLOR-CON) 20 MEQ tablet Patient to take a total of 4 tablets spaced over the next 24 hours and then 1 daily 40 tablet 0  . tadalafil (CIALIS) 20 MG tablet Take 1 tablet (20 mg total) by mouth as needed. 10 tablet prn  . vitamin C (ASCORBIC ACID) 500 MG tablet Take 500 mg by mouth 2 (two) times daily.    Marland Kitchen oxyCODONE-acetaminophen (PERCOCET) 7.5-325 MG per tablet Take 1 tablet by mouth every 4 (four) hours as needed for pain. 30 tablet 0   No current facility-administered medications for this visit.      Allergies  Allergen Reactions  . Simvastatin Other (See Comments)    "Muscle aches"  :  Family History  Problem Relation Age of Onset  . Cancer Father 58  . Peripheral vascular disease Mother 74    deceased  :  History   Social History  . Marital Status: Married    Spouse Name: N/A    Number of Children: N/A  . Years of Education: N/A   Occupational History  . salesman    Social History Main Topics  . Smoking status: Former Research scientist (life sciences)  . Smokeless tobacco: Not on file  . Alcohol Use: 0.6 oz/week    1 Cans of beer per week     Comment: 2 to 6 beers a week  . Drug Use: No  .  Sexual Activity: Not on file   Other Topics Concern  . Not on file   Social History Narrative  . No narrative on file  :  Pertinent items are noted in HPI.  Exam: 0 Blood pressure 164/63, pulse 66, temperature 98.3 F (36.8 C), temperature source Oral, resp. rate 18, height $RemoveBe'5\' 9"'VrDIiXRiH$  (1.753 m), weight 213 lb 14.4 oz (97.024 kg), SpO2 98 %. General appearance: alert and cooperative Head: Normocephalic, without obvious abnormality Throat: lips, mucosa, and tongue normal; teeth and gums normal Neck: no adenopathy Back: negative Resp: clear to auscultation bilaterally Chest wall: no tenderness Cardio: regular rate and rhythm, S1, S2 normal, no murmur, click, rub or gallop GI: soft, non-tender; bowel sounds normal; no masses,  no  organomegaly Extremity: 2+ lower actually edema  Neurologically intact.  Right anterior chest Port-A-Cath healing well, no evidence of infection  Ct Abdomen Pelvis W Contrast  02/22/2014   CLINICAL DATA:  Patient presents to the ER for evaluation of diarrhea. Patient reports that he has been feeling constipated over the last one or 2 days. He took a laxative at home. Around 10:30 this morning he started having watery diarrhea. Patient has had good control of diarrhea ever since. He reports feeling distended and diffusely cramping in his abdomen. He has not had any fever.  EXAM: CT ABDOMEN AND PELVIS WITH CONTRAST  TECHNIQUE: Multidetector CT imaging of the abdomen and pelvis was performed using the standard protocol following bolus administration of intravenous contrast.  CONTRAST:  129mL OMNIPAQUE IOHEXOL 300 MG/ML  SOLN  COMPARISON:  None.  FINDINGS: There are trace bilateral pleural effusions.  The liver is diminutive in size with nodular contour or consistent with cirrhosis. There are multiple hepatic masses with the largest in the right hepatic lobe measuring 5.8 x 6.8 cm. The largest mass in the left hepatic lobe measures 3.6 x 3.4 cm. There is a 17 mm cyst in the anterior left hepatic lobe. There is no intrahepatic or extrahepatic biliary ductal dilatation. There is cholelithiasis. The spleen demonstrates no focal abnormality. The kidneys, adrenal glands and pancreas are normal. The bladder is unremarkable.  There is distal transverse colon and descending colon bowel wall thickening and surrounding inflammatory changes most concerning for colitis. There is a moderate amount of abdominal and free fluid. There is a fat containing left inguinal hernia. There is no pneumoperitoneum, pneumatosis, or portal venous gas. There is a mildly enlarged periportal lymph node measuring 12.5 mm.  The abdominal aorta is normal in caliber with atherosclerosis.  There are no lytic or sclerotic osseous lesions. There is  severe degenerative disc disease at L5-S1 with a broad-based disc bulge and bilateral facet arthropathy. There are there is a right L5 pars interarticularis defect.  IMPRESSION: 1. 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 The overall appearance is most concerning for multifocal hepatocellular carcinoma versus intrahepatic metastasis arising from primary [facility] versus metastatic disease. 2. There is bowel wall thickening involving the distal transverse and descending colon concerning for colitis secondary to an infectious or inflammatory etiology. Recommend follow-up colonoscopy following resolution of patient's acute illness as colonic malignancy is in the differential diagnosis for the liver findings.   Electronically Signed   By: Kathreen Devoid   On: 02/22/2014 17:30   Mr Liver W Wo Contrast  02/27/2014   CLINICAL DATA:  Abdominal distention with diarrhea. CT demonstrates cirrhosis with multiple liver lesions. Evaluate for hepatocellular carcinoma. Initial encounter.  EXAM: MRI ABDOMEN WITHOUT AND WITH CONTRAST  TECHNIQUE: Multiplanar multisequence MR imaging of the abdomen was performed both before and after the administration of intravenous contrast.  CONTRAST:  10 ml Eovist.  COMPARISON:  Abdominal pelvic CT 02/22/2014.  FINDINGS: The examination is mildly motion degraded. As demonstrated on CT, there are bilateral pleural effusions with a moderate amount of ascites throughout the abdominal peritoneum. No peritoneal nodularity or abnormal enhancement is identified.  The liver is grossly abnormal with multiple conglomerate masses. Because of the conglomeration, the individual lesions are difficult to measure. There is one in the right hepatic lobe measuring approximately 10.2 cm on image 34 of series 504. One more inferiorly in the right lobe measures 6.2 cm on image 48, and there is a 2.7 cm lesion in the left lobe on image 48. These lesions demonstrate heterogeneous T1 and T2 signal.  Post-contrast, there is no significant hyper enhancement on the arterial phase images. The lesions are most conspicuous on the portal phase images on which they enhance less than background liver. The lesions fade on the delayed hepatocyte phase images. There is an approximately 1.8 cm cyst in the left hepatic lobe.  Suggested contour irregularity of the liver may be secondary to the intrinsic lesions and adjacent ascites rather than cirrhosis. The portal, hepatic, superior mesenteric and splenic veins are patent. Calcified gallstones and gallbladder wall thickening are noted. There is no significant biliary dilatation. The spleen, pancreas, adrenal glands and kidneys demonstrate no significant findings. As on CT, there are mildly prominent lymph nodes within the porta hepatis, nonspecific.  Generalized subcutaneous edema is noted. Patient is status post median sternotomy. No focal bowel lesions are identified.  IMPRESSION: 1. 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 Multifocal hepatocellular carcinoma is considered less likely based on the enhancement pattern, normal serum AFP and markedly elevated serum CEA levels. Occult GI malignancy suspected. Given the wall thickening of the descending and sigmoid colon on CT, further evaluation with colonoscopy recommended. 2. Bilateral pleural effusions and ascites without abnormal enhancement. 3. Nonspecific mildly prominent lymph nodes within the porta hepatis.   Electronically Signed   By: Camie Patience M.D.   On: 02/27/2014 12:20   Nm Pet Image Initial (pi) Skull Base To Thigh  03/07/2014   CLINICAL DATA:  Initial treatment strategy for adenocarcinoma with liver metastases, presumed colon primary.  EXAM: NUCLEAR MEDICINE PET SKULL BASE TO THIGH  TECHNIQUE: 10.9 mCi F-18 FDG was injected intravenously. Full-ring PET imaging was performed from the skull base to thigh after the radiotracer. CT data was obtained and used  for attenuation correction and anatomic localization.  FASTING BLOOD GLUCOSE:  Value: 99 mg/dl  COMPARISON:  MRI abdomen dated 02/27/2014. CT abdomen pelvis dated 02/22/2014.  FINDINGS: NECK  No hypermetabolic lymph nodes in the neck.  CHEST  Evaluation of lung parenchyma is constrained by respiratory motion. No suspicious pulmonary nodules on the CT scan.  Moderate right and small left pleural effusions. Associated compressive atelectasis in the right lower lobe.  No hypermetabolic mediastinal or hilar nodes.  ABDOMEN/PELVIS  Multifocal hypermetabolic lesions throughout the liver, corresponding to known metastatic disease. Max SUV in the central liver is 9.3.  No abnormal hypermetabolic activity within the pancreas, adrenal glands, or spleen.  Hypermetabolic apple core lesion involving the sigmoid colon (series 4/image 169), max SUV 17.5, worrisome for primary colonic adenocarcinoma.  Small retroperitoneal nodes. Small upper abdominal nodes, including an 11 mm short axis node in the porta hepatis (series 4/image 116), without convincing hypermetabolism.  Moderate abdominopelvic  ascites. Stranding/fluid along the omentum. No gross peritoneal disease/omental caking.  Cholelithiasis, without associated inflammatory changes. Vascular calcifications. Bladder is mildly thick-walled but underdistended. Small fat containing left inguinal hernia.  SKELETON  Heterogeneous osseous uptake without dominant focal lesion to suggest osseous metastasis.  IMPRESSION: Hypermetabolic lesion involving the sigmoid colon, worrisome for primary colonic adenocarcinoma.  Multifocal hypermetabolic lesions throughout the liver, corresponding to known metastatic disease.  Moderate abdominopelvic ascites. Although suspicious, there is no gross peritoneal disease/omental caking.  Moderate right and small left pleural effusions.   Electronically Signed   By: Julian Hy M.D.   On: 03/07/2014 17:02     Assessment and Plan:    68 year old gentleman with the following issues:  1. Metastatic colon cancer presented with hypermetabolic lesion in the sigmoid colon with liver metastasis. Biopsy proven to confirm the presence of adenocarcinoma of a colorectal primary. His K-ras mutation is pending.  Laboratory data was reviewed. He'll proceed with day 1 of cycle #1 of his systemic chemotherapy with  FOLFIRI today as scheduled.   2. IV access: Right anterior chest Port-A-Cath was placed by interventional radiology on 03/19/2014. Patient has Emla cream available and understands how to use it.   3. Antiemetics: Zofran is available to the patient and he understands to use this medication.   4. Primary tumor management: There is no urgency to address his primary tumor unless he becomes symptomatic from it.This would include obstruction or pain.  5. Follow-up: He'll follow up with Dr. should not his PSA scheduled on 04/10/2014 with repeat labs and prior to his next scheduled cycle of chemotherapy.   Wynetta Emery, Dekisha Mesmer E, PA-C

## 2014-03-27 NOTE — Progress Notes (Signed)
Patient does not have prn antiemetics ordered for home. Per Awilda Metro, PA, RN to call in Compazine 10 mg po Q6 PRN for nausea with 30 tablets and 1 refill to patient's preferred pharmacy.   1444 chemotherapy complete, NS flushing, patient reports transient numbness around his mouth during infusion but did not alert RN. Pt states now this is gone; he also felt lightheaded when sitting up from lying in chair. VS do not indicate low BP. Denies chest pain, shortness of breath, or difficulty swallowing. [place], NP notified of the above.  [place] at chairside, although patient is in the bathroom; discussed patient's care and per NP, give 25 mg IV benadryl and 25 mg pepcid before proceeding with 5FU push and pump connect.  1505 patient ambulated to bathroom independently and states "I feel fine;" RN to proceed with meds per [place], NP. RN will also give atropine due to patient's history of diarrhea. Patient agreeable to this plan.  1535 VSS, patient reports symptoms have resolved and is ready to go home. Selena Lesser, NP notified.  1545 patient discharged to home with spouse in stable condition after 5FU push and pump connect, education provided on home infusion, safety, and when to call/return to clinic. Patient verbalizes understanding.

## 2014-03-28 ENCOUNTER — Telehealth: Payer: Self-pay | Admitting: *Deleted

## 2014-03-28 LAB — CEA: CEA: 640.1 ng/mL — AB (ref 0.0–5.0)

## 2014-03-28 NOTE — Assessment & Plan Note (Signed)
nnnnn

## 2014-03-28 NOTE — Telephone Encounter (Signed)
Spoke with pt today for post chemo follow up.  Pt stated he was doing ok.  Had some trouble going to sleep last night but was able to sleep for short intervals.  Denied nausea/vomiting; bladder functions fine.  Has mild constipation which pt knows to take stool softeners.  Gave pt suggestions for dietary - eat prunes, drink prune juice to help with constipation.  Has ongoing  mild pain with knees and ankles.  Pt has pain meds available if needed.  Pt aware of returned appts.  Pt understood to call office with any new problems.

## 2014-03-29 ENCOUNTER — Ambulatory Visit (HOSPITAL_BASED_OUTPATIENT_CLINIC_OR_DEPARTMENT_OTHER): Payer: Medicare Other

## 2014-03-29 ENCOUNTER — Encounter: Payer: Self-pay | Admitting: Nurse Practitioner

## 2014-03-29 DIAGNOSIS — C187 Malignant neoplasm of sigmoid colon: Secondary | ICD-10-CM

## 2014-03-29 DIAGNOSIS — C787 Secondary malignant neoplasm of liver and intrahepatic bile duct: Secondary | ICD-10-CM

## 2014-03-29 DIAGNOSIS — T7840XA Allergy, unspecified, initial encounter: Secondary | ICD-10-CM | POA: Insufficient documentation

## 2014-03-29 DIAGNOSIS — R160 Hepatomegaly, not elsewhere classified: Secondary | ICD-10-CM

## 2014-03-29 MED ORDER — SODIUM CHLORIDE 0.9 % IJ SOLN
10.0000 mL | INTRAMUSCULAR | Status: DC | PRN
  Administered 2014-03-29: 10 mL
  Filled 2014-03-29: qty 10

## 2014-03-29 MED ORDER — HEPARIN SOD (PORK) LOCK FLUSH 100 UNIT/ML IV SOLN
500.0000 [IU] | Freq: Once | INTRAVENOUS | Status: AC | PRN
  Administered 2014-03-29: 500 [IU]
  Filled 2014-03-29: qty 5

## 2014-03-29 NOTE — Assessment & Plan Note (Signed)
Patient presented to the [facility] today to receive his first cycle of Folfiri/Avastin therapy regimen.  This will be cycled on an every 14 day basis.  He is scheduled to receive cycle 2 of the same regimen on the super first 2015.

## 2014-03-29 NOTE — Patient Instructions (Signed)
Continue labs and chemotherapy as scheduled  Follow up in 2 weeks 

## 2014-03-29 NOTE — Progress Notes (Signed)
will   SYMPTOM MANAGEMENT CLINIC   HPI: Frank [name] 68 y.o. male diagnosed with colon cancer.  Here today to initiate cycle 1 of Folfiri/Avastin chemotherapy regimen.   Patient presented to the [facility] today to receive cycle one of his Folfiri/Avastin chemotherapy regimen.  He completed all but the 5-FU portion of his chemotherapy; and developed some mild mouth tingling and dizziness.  Initiated hypersensitivity protocol medications which consisted of Benadryl 25 mg IV and Pepcid 20 iv.  All of patient's symptoms at did resolve; and patient was able to complete his chemotherapy and be attached to the 5-FU pump today.  HPI  CURRENT THERAPY: Upcoming Treatment Dates - COLORECTAL FOLFIRI q14d Days with orders from any treatment category:  04/10/2014      SCHEDULING COMMUNICATION      ondansetron (ZOFRAN) IVPB 16 mg      Dexamethasone Sodium Phosphate (DECADRON) injection 20 mg      irinotecan (CAMPTOSAR) 272 mg in dextrose 5 % 500 mL chemo infusion      leucovorin 868 mg in dextrose 5 % 250 mL infusion      fluorouracil (ADRUCIL) chemo injection 850 mg      fluorouracil (ADRUCIL) 5,200 mg in sodium chloride 0.9 % 150 mL chemo infusion      atropine injection 0.5 mg      sodium chloride 0.9 % injection 10 mL      heparin lock flush 100 unit/mL      heparin lock flush 100 unit/mL      alteplase (CATHFLO ACTIVASE) injection 2 mg      sodium chloride 0.9 % injection 3 mL      Cold Pack 1 packet      0.9 %  sodium chloride infusion      TREATMENT CONDITIONS 04/12/2014      SCHEDULING COMMUNICATION      sodium chloride 0.9 % injection 10 mL      heparin lock flush 100 unit/mL      heparin lock flush 100 unit/mL      alteplase (CATHFLO ACTIVASE) injection 2 mg      sodium chloride 0.9 % injection 3 mL      Cold Pack 1 packet 04/24/2014      SCHEDULING COMMUNICATION      ondansetron (ZOFRAN) IVPB 16 mg      Dexamethasone Sodium Phosphate (DECADRON) injection 20 mg  irinotecan (CAMPTOSAR) 272 mg in dextrose 5 % 500 mL chemo infusion      leucovorin 868 mg in dextrose 5 % 250 mL infusion      fluorouracil (ADRUCIL) chemo injection 850 mg      fluorouracil (ADRUCIL) 5,200 mg in sodium chloride 0.9 % 150 mL chemo infusion      atropine injection 0.5 mg      sodium chloride 0.9 % injection 10 mL      heparin lock flush 100 unit/mL      heparin lock flush 100 unit/mL      alteplase (CATHFLO ACTIVASE) injection 2 mg      sodium chloride 0.9 % injection 3 mL      Cold Pack 1 packet      0.9 %  sodium chloride infusion      TREATMENT CONDITIONS    ROS  Past Medical History  Diagnosis Date  . Hyperlipidemia   . Hypertension   . Ischemic heart disease   . Obesity     Past Surgical History  Procedure Laterality Date  .  Coronary artery bypass graft    . Back surgery      has Pure hypercholesterolemia; Benign hypertensive heart disease without heart failure; Premature atrial contractions; Sepsis; Colitis; Liver masses; Acute blood loss anemia; Thrombocytopenia; Leukocytosis; Malignant neoplasm of colon; and Hypersensitivity reaction on his problem list.     is allergic to simvastatin.    Medication List       This list is accurate as of: 03/27/14 11:59 PM.  Always use your most recent med list.               atenolol 25 MG tablet  Commonly known as:  TENORMIN  Take 1 tablet (25 mg total) by mouth 2 (two) times daily.     docusate sodium 100 MG capsule  Commonly known as:  COLACE  Take 200 mg by mouth daily.     ferrous sulfate 325 (65 FE) MG EC tablet  Take 1 tablet (325 mg total) by mouth daily with breakfast.     furosemide 40 MG tablet  Commonly known as:  LASIX  Take 1 tablet (40 mg total) by mouth daily.     lidocaine-prilocaine cream  Commonly known as:  EMLA  Apply 1 application topically as needed. On treatment or lab days, place on Port site 1-2 hours prior to appts.     multivitamin with minerals Tabs tablet  Take 1  tablet by mouth daily.     oxyCODONE-acetaminophen 7.5-325 MG per tablet  Commonly known as:  PERCOCET  Take 1 tablet by mouth every 4 (four) hours as needed for pain.     potassium chloride SA 20 MEQ tablet  Commonly known as:  K-DUR,KLOR-CON  Patient to take a total of 4 tablets spaced over the next 24 hours and then 1 daily     prochlorperazine 10 MG tablet  Commonly known as:  COMPAZINE  Take 1 tablet (10 mg total) by mouth every 6 (six) hours as needed for nausea or vomiting.     ramipril 5 MG capsule  Commonly known as:  ALTACE  Take 1 capsule (5 mg total) by mouth daily.     tadalafil 20 MG tablet  Commonly known as:  CIALIS  Take 1 tablet (20 mg total) by mouth as needed.     vitamin C 500 MG tablet  Commonly known as:  ASCORBIC ACID  Take 500 mg by mouth 2 (two) times daily.         PHYSICAL EXAMINATION  Vitals:  BP  136/72, HR 80 temp 97.7  Physical Exam  Constitutional: He is oriented to person, place, and time and well-developed, well-nourished, and in no distress.  HENT:  Head: Normocephalic and atraumatic.  Mouth/Throat: Oropharynx is clear and moist.  Eyes: Conjunctivae and EOM are normal. Pupils are equal, round, and reactive to light. No scleral icterus.  Neck: Normal range of motion. No JVD present.  Pulmonary/Chest: Effort normal. No respiratory distress.  Neurological: He is alert and oriented to person, place, and time. Gait normal.  Psychiatric: Affect normal.  Nursing note and vitals reviewed.   LABORATORY DATA:. Appointment on 03/27/2014  Component Date Value Ref Range Status  . WBC 03/27/2014 8.4  4.0 - 10.3 10e3/uL Final  . NEUT# 03/27/2014 4.9  1.5 - 6.5 10e3/uL Final  . HGB 03/27/2014 10.3* 13.0 - 17.1 g/dL Final  . HCT 03/27/2014 33.4* 38.4 - 49.9 % Final  . Platelets 03/27/2014 150 Large & giant platelets  140 - 400 10e3/uL Final  . MCV  03/27/2014 80.9  79.3 - 98.0 fL Final  . MCH 03/27/2014 24.8* 27.2 - 33.4 pg Final  . MCHC  03/27/2014 30.7* 32.0 - 36.0 g/dL Final  . RBC 03/27/2014 4.13* 4.20 - 5.82 10e6/uL Final  . RDW 03/27/2014 30.1* 11.0 - 14.6 % Final  . lymph# 03/27/2014 0.6* 0.9 - 3.3 10e3/uL Final  . MONO# 03/27/2014 2.7* 0.1 - 0.9 10e3/uL Final  . Eosinophils Absolute 03/27/2014 0.0  0.0 - 0.5 10e3/uL Final  . Basophils Absolute 03/27/2014 0.0  0.0 - 0.1 10e3/uL Final  . NEUT% 03/27/2014 58.8  39.0 - 75.0 % Final  . LYMPH% 03/27/2014 7.3* 14.0 - 49.0 % Final  . MONO% 03/27/2014 32.9* 0.0 - 14.0 % Final  . EOS% 03/27/2014 0.6  0.0 - 7.0 % Final  . BASO% 03/27/2014 0.4  0.0 - 2.0 % Final  . nRBC 03/27/2014 0  0 - 0 % Final  . Sodium 03/27/2014 138  136 - 145 mEq/L Final  . Potassium 03/27/2014 4.1  3.5 - 5.1 mEq/L Final  . Chloride 03/27/2014 102  98 - 109 mEq/L Final  . CO2 03/27/2014 25  22 - 29 mEq/L Final  . Glucose 03/27/2014 136  70 - 140 mg/dl Final  . BUN 03/27/2014 16.1  7.0 - 26.0 mg/dL Final  . Creatinine 03/27/2014 1.0  0.7 - 1.3 mg/dL Final  . Total Bilirubin 03/27/2014 0.90  0.20 - 1.20 mg/dL Final  . Alkaline Phosphatase 03/27/2014 355* 40 - 150 U/L Final  . AST 03/27/2014 28  5 - 34 U/L Final  . ALT 03/27/2014 20  0 - 55 U/L Final  . Total Protein 03/27/2014 7.3  6.4 - 8.3 g/dL Final  . Albumin 03/27/2014 2.9* 3.5 - 5.0 g/dL Final  . Calcium 03/27/2014 9.1  8.4 - 10.4 mg/dL Final  . Anion Gap 03/27/2014 10  3 - 11 mEq/L Final  . CEA 03/27/2014 640.1* 0.0 - 5.0 ng/mL Final   Result confirmed by automatic dilution.  . Protein, ur 03/27/2014 30  Negative- <30 mg/dL Final  . Technologist Review 03/27/2014 Occ Variant lymphs present   Final     RADIOGRAPHIC STUDIES: No results found.  ASSESSMENT/PLAN:    Ischemic heart disease nnnnn  Hypersensitivity reaction Patient presented to the [facility] today to receive cycle one of his Folfiri/Avastin chemotherapy regimen.  He completed all but the 5-FU push and pump connection with no difficulty whatsoever; but then developed some  mild mouth tingling in dizziness.  Hypersensitivity reaction was managed per protocol.  Patient was given Benadryl 25 mg IV and Pepcid 20 mg IV.  All symptoms did resolve; and patient was able to complete the 5-FU push and connection to the pump.  Malignant neoplasm of colon Patient presented to the [facility] today to receive his first cycle of Folfiri/Avastin therapy regimen.  This will be cycled on an every 14 day basis.  He is scheduled to receive cycle 2 of the same regimen on the super first 2015.   Patient stated understanding of all instructions; and was in agreement with this plan of care. The patient knows to call the clinic with any problems, questions or concerns.   Review/collaboration with Dr. [name] regarding all aspects of patient's visit today.   Total time spent with patient was 25 minutes;  with greater than 75 percent of that time spent in face to face counseling regarding his symptoms, frequent monitoring while in the infusion area, and coordination of care and follow up.  Disclaimer: This note was dictated with voice recognition software. Similar sounding words can inadvertently be transcribed and may not be corrected upon review.   Drue Second, NP 03/29/2014

## 2014-03-29 NOTE — Assessment & Plan Note (Signed)
Patient presented to the [facility] today to receive cycle one of his Folfiri/Avastin chemotherapy regimen.  He completed all but the 5-FU push and pump connection with no difficulty whatsoever; but then developed some mild mouth tingling in dizziness.  Hypersensitivity reaction was managed per protocol.  Patient was given Benadryl 25 mg IV and Pepcid 20 mg IV.  All symptoms did resolve; and patient was able to complete the 5-FU push and connection to the pump.

## 2014-04-02 ENCOUNTER — Telehealth: Payer: Self-pay | Admitting: *Deleted

## 2014-04-02 NOTE — Telephone Encounter (Signed)
   Provider input needed: Rectal Bleeding   Reason for call: Rectal Bleeding  Gastrointestinal: positive for diarrhea and rectal bleeding with BM   ALLERGIES:  is allergic to simvastatin.  Patient last received chemotherapy/ treatment on 03/27/14-Avastin,CPT-11, 5 FU/LV  Patient was last seen in the office on 03/27/14  Next appt is 04/10/14  Is patient having fevers greater than 100.5?  no   Is patient having uncontrolled pain, or new pain? no   Is patient having new back pain that changes with position (worsens or eases when laying down?)  no   Is patient able to eat and drink? yes    Is patient able to pass stool without difficulty?   yes, loose stools-last BM was "pure liquid" today     Is patient having uncontrolled nausea?  no    Significant other calls 04/02/2014 with complaint of  Loose stools-has not taken any meds yet and has bright red blood in toilet with BM. He does have hemorrhoids and is sore.   Summary Based on the above information advised significant other to  Start Imodium #2 tablets qid if needed to slow down stools. Use baby wipes, Tucks pad in pants. OTC hemorrhoid cream and sitz bath couple times/day. Call tomorrow if bleeding/diarrhea does not improve with these interventions.   Tania Ade  04/02/2014, 3:32 PM   Background Info  Frank [name]   DOB: 11/24/1945   MR#: [id number]   CSN#   [id number] 04/02/2014

## 2014-04-10 ENCOUNTER — Ambulatory Visit (HOSPITAL_BASED_OUTPATIENT_CLINIC_OR_DEPARTMENT_OTHER): Payer: Medicare Other

## 2014-04-10 ENCOUNTER — Other Ambulatory Visit: Payer: Self-pay | Admitting: *Deleted

## 2014-04-10 ENCOUNTER — Other Ambulatory Visit (HOSPITAL_BASED_OUTPATIENT_CLINIC_OR_DEPARTMENT_OTHER): Payer: Medicare Other

## 2014-04-10 ENCOUNTER — Telehealth: Payer: Self-pay | Admitting: Oncology

## 2014-04-10 ENCOUNTER — Ambulatory Visit: Payer: Medicare Other

## 2014-04-10 ENCOUNTER — Ambulatory Visit (HOSPITAL_BASED_OUTPATIENT_CLINIC_OR_DEPARTMENT_OTHER): Payer: Medicare Other | Admitting: Oncology

## 2014-04-10 VITALS — BP 147/77 | HR 99 | Temp 96.8°F | Resp 18 | Ht 69.0 in | Wt 179.9 lb

## 2014-04-10 VITALS — BP 150/69 | HR 87

## 2014-04-10 DIAGNOSIS — C787 Secondary malignant neoplasm of liver and intrahepatic bile duct: Secondary | ICD-10-CM

## 2014-04-10 DIAGNOSIS — C799 Secondary malignant neoplasm of unspecified site: Secondary | ICD-10-CM

## 2014-04-10 DIAGNOSIS — C187 Malignant neoplasm of sigmoid colon: Secondary | ICD-10-CM

## 2014-04-10 DIAGNOSIS — Z95828 Presence of other vascular implants and grafts: Secondary | ICD-10-CM

## 2014-04-10 DIAGNOSIS — E860 Dehydration: Secondary | ICD-10-CM

## 2014-04-10 DIAGNOSIS — C189 Malignant neoplasm of colon, unspecified: Secondary | ICD-10-CM

## 2014-04-10 LAB — CBC WITH DIFFERENTIAL/PLATELET
BASO%: 0 % (ref 0.0–2.0)
BASOS ABS: 0 10*3/uL (ref 0.0–0.1)
EOS ABS: 0 10*3/uL (ref 0.0–0.5)
EOS%: 0.7 % (ref 0.0–7.0)
HEMATOCRIT: 30.4 % — AB (ref 38.4–49.9)
HEMOGLOBIN: 9.5 g/dL — AB (ref 13.0–17.1)
LYMPH#: 0.8 10*3/uL — AB (ref 0.9–3.3)
LYMPH%: 29.6 % (ref 14.0–49.0)
MCH: 25.5 pg — ABNORMAL LOW (ref 27.2–33.4)
MCHC: 31.3 g/dL — ABNORMAL LOW (ref 32.0–36.0)
MCV: 81.5 fL (ref 79.3–98.0)
MONO#: 1.1 10*3/uL — ABNORMAL HIGH (ref 0.1–0.9)
MONO%: 41.6 % — ABNORMAL HIGH (ref 0.0–14.0)
NEUT%: 28.1 % — AB (ref 39.0–75.0)
NEUTROS ABS: 0.8 10*3/uL — AB (ref 1.5–6.5)
Platelets: 205 10*3/uL (ref 140–400)
RBC: 3.73 10*6/uL — ABNORMAL LOW (ref 4.20–5.82)
RDW: 20.6 % — ABNORMAL HIGH (ref 11.0–14.6)
WBC: 2.7 10*3/uL — ABNORMAL LOW (ref 4.0–10.3)

## 2014-04-10 LAB — COMPREHENSIVE METABOLIC PANEL (CC13)
ALBUMIN: 2.7 g/dL — AB (ref 3.5–5.0)
ALT: 12 U/L (ref 0–55)
ANION GAP: 11 meq/L (ref 3–11)
AST: 13 U/L (ref 5–34)
Alkaline Phosphatase: 260 U/L — ABNORMAL HIGH (ref 40–150)
BUN: 9.2 mg/dL (ref 7.0–26.0)
CHLORIDE: 104 meq/L (ref 98–109)
CO2: 20 meq/L — AB (ref 22–29)
CREATININE: 0.9 mg/dL (ref 0.7–1.3)
Calcium: 8.2 mg/dL — ABNORMAL LOW (ref 8.4–10.4)
GLUCOSE: 124 mg/dL (ref 70–140)
Potassium: 3.1 mEq/L — ABNORMAL LOW (ref 3.5–5.1)
Sodium: 135 mEq/L — ABNORMAL LOW (ref 136–145)
Total Bilirubin: 0.59 mg/dL (ref 0.20–1.20)
Total Protein: 6.1 g/dL — ABNORMAL LOW (ref 6.4–8.3)

## 2014-04-10 LAB — CEA: CEA: 764.4 ng/mL — AB (ref 0.0–5.0)

## 2014-04-10 LAB — TECHNOLOGIST REVIEW

## 2014-04-10 MED ORDER — HEPARIN SOD (PORK) LOCK FLUSH 100 UNIT/ML IV SOLN
500.0000 [IU] | Freq: Once | INTRAVENOUS | Status: AC
  Administered 2014-04-10: 500 [IU] via INTRAVENOUS
  Filled 2014-04-10: qty 5

## 2014-04-10 MED ORDER — ONDANSETRON 8 MG PO TBDP: 8.0000 mg | ORAL_TABLET | Freq: Three times a day (TID) | ORAL | Status: AC | PRN

## 2014-04-10 MED ORDER — SODIUM CHLORIDE 0.9 % IJ SOLN
10.0000 mL | INTRAMUSCULAR | Status: DC | PRN
  Administered 2014-04-10: 10 mL via INTRAVENOUS
  Filled 2014-04-10: qty 10

## 2014-04-10 MED ORDER — SODIUM CHLORIDE 0.9 % IV SOLN
1000.0000 mL | Freq: Once | INTRAVENOUS | Status: AC
  Administered 2014-04-10: 11:00:00 via INTRAVENOUS

## 2014-04-10 MED ORDER — POTASSIUM CHLORIDE CRYS ER 20 MEQ PO TBCR: 20.0000 meq | EXTENDED_RELEASE_TABLET | Freq: Every day | ORAL | Status: DC

## 2014-04-10 NOTE — Telephone Encounter (Signed)
gv and printed appt sched and avs for pt for Dec...sed added tx. °

## 2014-04-10 NOTE — Progress Notes (Signed)
Per Dr. [name], cancel treatment today. Dixie RN, Dr.Shadad's nurse, reviewed labs and new orders with patient and caregiver.

## 2014-04-10 NOTE — Patient Instructions (Signed)
Hypokalemia Hypokalemia means that the amount of potassium in the blood is lower than normal.Potassium is a chemical, called an electrolyte, that helps regulate the amount of fluid in the body. It also stimulates muscle contraction and helps nerves function properly.Most of the body's potassium is inside of cells, and only a very small amount is in the blood. Because the amount in the blood is so small, minor changes can be life-threatening. CAUSES  Antibiotics.  Diarrhea or vomiting.  Using laxatives too much, which can cause diarrhea.  Chronic kidney disease.  Water pills (diuretics).  Eating disorders (bulimia).  Low magnesium level.  Sweating a lot. SIGNS AND SYMPTOMS  Weakness.  Constipation.  Fatigue.  Muscle cramps.  Mental confusion.  Skipped heartbeats or irregular heartbeat (palpitations).  Tingling or numbness. DIAGNOSIS  Your health care provider can diagnose hypokalemia with blood tests. In addition to checking your potassium level, your health care provider may also check other lab tests. TREATMENT Hypokalemia can be treated with potassium supplements taken by mouth or adjustments in your current medicines. If your potassium level is very low, you may need to get potassium through a vein (IV) and be monitored in the hospital. A diet high in potassium is also helpful. Foods high in potassium are:  Nuts, such as peanuts and pistachios.  Seeds, such as sunflower seeds and pumpkin seeds.  Peas, lentils, and lima beans.  Whole grain and bran cereals and breads.  Fresh fruit and vegetables, such as apricots, avocado, bananas, cantaloupe, kiwi, oranges, tomatoes, asparagus, and potatoes.  Orange and tomato juices.  Red meats.  Fruit yogurt. HOME CARE INSTRUCTIONS  Take all medicines as prescribed by your health care provider.  Maintain a healthy diet by including nutritious food, such as fruits, vegetables, nuts, whole grains, and lean meats.  If  you are taking a laxative, be sure to follow the directions on the label. SEEK MEDICAL CARE IF:  Your weakness gets worse.  You feel your heart pounding or racing.  You are vomiting or having diarrhea.  You are diabetic and having trouble keeping your blood glucose in the normal range. SEEK IMMEDIATE MEDICAL CARE IF:  You have chest pain, shortness of breath, or dizziness.  You are vomiting or having diarrhea for more than 2 days.  You faint. MAKE SURE YOU:   Understand these instructions.  Will watch your condition.  Will get help right away if you are not doing well or get worse. Document Released: 04/27/2005 Document Revised: 02/15/2013 Document Reviewed: 10/28/2012 [facility] Patient Information 2015 [place], [place]. This information is not intended to replace advice given to you by your health care provider. Make sure you discuss any questions you have with your health care provider. Neutropenia Neutropenia is a condition that occurs when the level of a certain type of white blood cell (neutrophil) in your body becomes lower than normal. Neutrophils are made in the bone marrow and fight infections. These cells protect against bacteria and viruses. The fewer neutrophils you have, and the longer your body remains without them, the greater your risk of getting a severe infection becomes. CAUSES  The cause of neutropenia may be hard to determine. However, it is usually due to 3 main problems:   Decreased production of neutrophils. This may be due to:  Certain medicines such as chemotherapy.  Genetic problems.  Cancer.  Radiation treatments.  Vitamin deficiency.  Some pesticides.  Increased destruction of neutrophils. This may be due to:  Overwhelming infections.  Hemolytic anemia. This  is when the body destroys its own blood cells.  Chemotherapy.  Neutrophils moving to areas of the body where they cannot fight infections. This may be due to:  Dialysis  procedures.  Conditions where the spleen becomes enlarged. Neutrophils are held in the spleen and are not available to the rest of the body.  Overwhelming infections. The neutrophils are held in the area of the infection and are not available to the rest of the body. SYMPTOMS  There are no specific symptoms of neutropenia. The lack of neutrophils can result in an infection, and an infection can cause various problems. DIAGNOSIS  Diagnosis is made by a blood test. A complete blood count is performed. The normal level of neutrophils in human blood differs with age and race. Infants have lower counts than older children and adults. African Americans have lower counts than Caucasians or Asians. The average adult level is 1500 cells/mm3 of blood. Neutrophil counts are interpreted as follows:  Greater than 1000 cells/mm3 gives normal protection against infection.  500 to 1000 cells/mm3 gives an increased risk for infection.  200 to 500 cells/mm3 is a greater risk for severe infection.  Lower than 200 cells/mm3 is a marked risk of infection. This may require hospitalization and treatment with antibiotic medicines. TREATMENT  Treatment depends on the underlying cause, severity, and presence of infections or symptoms. It also depends on your health. Your caregiver will discuss the treatment plan with you. Mild cases are often easily treated and have a good outcome. Preventative measures may also be started to limit your risk of infections. Treatment can include:  Taking antibiotics.  Stopping medicines that are known to cause neutropenia.  Correcting nutritional deficiencies by eating green vegetables to supply folic acid and taking vitamin B supplements.  Stopping exposure to pesticides if your neutropenia is related to pesticide exposure.  Taking a blood growth factor called sargramostim, pegfilgrastim, or filgrastim if you are undergoing chemotherapy for cancer. This stimulates white blood cell  production.  Removal of the spleen if you have Felty's syndrome and have repeated infections. HOME CARE INSTRUCTIONS   Follow your caregiver's instructions about when you need to have blood work done.  Wash your hands often. Make sure others who come in contact with you also wash their hands.  Wash raw fruits and vegetables before eating them. They can carry bacteria and fungi.  Avoid people with colds or spreadable (contagious) diseases (chickenpox, herpes zoster, influenza).  Avoid large crowds.  Avoid construction areas. The dust can release fungus into the air.  Be cautious around children in daycare or school environments.  Take care of your respiratory system by coughing and deep breathing.  Bathe daily.  Protect your skin from cuts and burns.  Do not work in the garden or with flowers and plants.  Care for the mouth before and after meals by brushing with a soft toothbrush. If you have mucositis, do not use mouthwash. Mouthwash contains alcohol and can dry out the mouth even more.  Clean the area between the genitals and the anus (perineal area) after urination and bowel movements. Women need to wipe from front to back.  Use a water soluble lubricant during sexual intercourse and practice good hygiene after. Do not have intercourse if you are severely neutropenic. Check with your caregiver for guidelines.  Exercise daily as tolerated.  Avoid people who were vaccinated with a live vaccine in the past 30 days. You should not receive live vaccines (polio, typhoid).  Do not provide  direct care for pets. Avoid animal droppings. Do not clean litter boxes and bird cages.  Do not share food utensils.  Do not use tampons, enemas, or rectal suppositories unless directed by your caregiver.  Use an electric razor to remove hair.  Wash your hands after handling magazines, letters, and newspapers. SEEK IMMEDIATE MEDICAL CARE IF:   You have a fever.  You have chills or start  to shake.  You feel nauseous or vomit.  You develop mouth sores.  You develop aches and pains.  You have redness and swelling around open wounds.  Your skin is warm to the touch.  You have pus coming from your wounds.  You develop swollen lymph nodes.  You feel weak or fatigued.  You develop red streaks on the skin. MAKE SURE YOU:  Understand these instructions.  Will watch your condition.  Will get help right away if you are not doing well or get worse. Document Released: 10/17/2001 Document Revised: 07/20/2011 Document Reviewed: 11/14/2010 [facility] Patient Information 2015 [place], [place]. This information is not intended to replace advice given to you by your health care provider. Make sure you discuss any questions you have with your health care provider.

## 2014-04-10 NOTE — Progress Notes (Signed)
Hematology and Oncology Follow Up Visit  Frank [name] [id number] 05/26/1945 68 y.o. 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 He presented with sigmoid colon lesion and liver metastasis.   Prior Therapy: He is status post liver biopsy on 03/01/2014 confirmed the presence of metastatic adenocarcinoma. He is status post IV iron in the form of Feraheme on 03/16/2014 due to anemia.  Current therapy: FOLFIRI and a Avastin started on 03/27/2014 and he is here for cycle 2.  Interim History:  Frank [name] presents today for a follow-up visit. Since the last visit, he received chemotherapy with few complications. He reported mild nausea but no vomiting. He also reported grade 1 diarrhea that responded to Imodium. His by mouth intake has been relatively poor at times and susceptible to dehydration. Otherwise he does not report any complications related to chemotherapy. He does not report any headaches or blurry vision or syncope. He does not report any fevers or chills or sweats. He does report poor by mouth intake and weight loss. He does not report any chest pain, palpitation or orthopnea. Does not report any shortness of breath or dyspnea on exertion. He does not report any abdominal distention or early satiety. He does not report any frequency, urgency or hesitancy. He does not report any skeletal complaints. He does not report any petechiae or lymphadenopathy. Rest of his review of systems unremarkable.  Medications: I have reviewed the patient's current medications.  Current Outpatient Prescriptions  Medication Sig Dispense Refill  . atenolol (TENORMIN) 25 MG tablet Take 1 tablet (25 mg total) by mouth 2 (two) times daily. 60 tablet 0  . docusate sodium (COLACE) 100 MG capsule Take 200 mg by mouth daily.    . ferrous sulfate 325 (65 FE) MG EC tablet Take 1 tablet (325  mg total) by mouth daily with breakfast. 30 tablet 0  . furosemide (LASIX) 40 MG tablet Take 1 tablet (40 mg total) by mouth daily. 30 tablet 5  . lidocaine-prilocaine (EMLA) cream Apply 1 application topically as needed. On treatment or lab days, place on Port site 1-2 hours prior to appts. 30 g 1  . Multiple Vitamin (MULTIVITAMIN WITH MINERALS) TABS tablet Take 1 tablet by mouth daily.    . ondansetron (ZOFRAN ODT) 8 MG disintegrating tablet Take 1 tablet (8 mg total) by mouth every 8 (eight) hours as needed for nausea or vomiting. 20 tablet 0  . oxyCODONE-acetaminophen (PERCOCET) 7.5-325 MG per tablet Take 1 tablet by mouth every 4 (four) hours as needed for pain. 30 tablet 0  . potassium chloride SA (K-DUR,KLOR-CON) 20 MEQ tablet Patient to take a total of 4 tablets spaced over the next 24 hours and then 1 daily 40 tablet 0  . prochlorperazine (COMPAZINE) 10 MG tablet Take 1 tablet (10 mg total) by mouth every 6 (six) hours as needed for nausea or vomiting. 30 tablet 1  . ramipril (ALTACE) 5 MG capsule Take 1 capsule (5 mg total) by mouth daily. 30 capsule 5  . tadalafil (CIALIS) 20 MG tablet Take 1 tablet (20 mg total) by mouth as needed. 10 tablet prn  . vitamin C (ASCORBIC ACID) 500 MG tablet Take 500 mg by mouth 2 (two) times daily.     No current facility-administered medications for this visit.     Allergies:  Allergies  Allergen Reactions  . Simvastatin Other (See Comments)    "  Muscle aches"    Past Medical History, Surgical history, Social history, and Family History were reviewed and updated.   Physical Exam: Blood pressure 147/77, pulse 99, temperature 96.8 F (36 C), temperature source Oral, resp. rate 18, height _0  (1.753 m), weight 179 lb 14.4 oz (81.602 kg). ECOG: 1 General appearance: alert and cooperative Head: Normocephalic, without obvious abnormality Neck: no adenopathy Lymph nodes: Cervical, supraclavicular, and axillary nodes normal. Heart:regular rate  and rhythm, S1, S2 normal, no murmur, click, rub or gallop Lung:chest clear, no wheezing, rales, normal symmetric air entry Abdomin: soft, non-tender, without masses or organomegaly EXT:no erythema, induration, or nodules   Lab Results: Lab Results  Component Value Date   WBC 8.4 03/27/2014   HGB 10.3* 03/27/2014   HCT 33.4* 03/27/2014   MCV 80.9 03/27/2014   PLT 150 Large & giant platelets 03/27/2014     Chemistry      Component Value Date/Time   NA 138 03/27/2014 1029   NA 137 03/19/2014 1229   K 4.1 03/27/2014 1029   K 4.0 03/19/2014 1229   CL 98 03/19/2014 1229   CO2 25 03/27/2014 1029   CO2 24 03/19/2014 1229   BUN 16.1 03/27/2014 1029   BUN 15 03/19/2014 1229   CREATININE 1.0 03/27/2014 1029   CREATININE 0.98 03/19/2014 1229      Component Value Date/Time   CALCIUM 9.1 03/27/2014 1029   CALCIUM 8.9 03/19/2014 1229   ALKPHOS 355* 03/27/2014 1029   ALKPHOS 351* 03/09/2014 0851   AST 28 03/27/2014 1029   AST 26 03/09/2014 0851   ALT 20 03/27/2014 1029   ALT 22 03/09/2014 0851   BILITOT 0.90 03/27/2014 1029   BILITOT 1.8* 03/09/2014 0851       Impression and Plan:   68 year old gentleman with the following issues:  1. Metastatic colon cancer presented with hypermetabolic lesion in the sigmoid colon with liver metastasis. Biopsy proven to confirm the presence of adenocarcinoma of a colorectal primary. His K-ras mutation is currently pending. He is currently on chemotherapy in the form of FOLFIRI and a Avastin started on 03/27/2014. He is ready to proceed with cycle 2 if his laboratory testing are adequate at this time.  2. IV access: He has a Port-A-Cath inserted without any complications.  3. Antiemetics: I gave a prescription for Zofran ODT for better tolerance as he is having difficulty with swallowing.  4. Dehydration: He is a high risk of developing dehydration and for that reason I will bring him next week for a liter of normal saline fluid  infusion.  5. Primary tumor management: There is no urgency to address his primary tumor unless he becomes symptomatic from it. He does not have any symptoms of obstruction or pain.  6. Follow-up: Will be in 2 weeks for the next cycle of chemotherapy.   OZHYQM,VHQIO, MD 12/1/20159:58 AM

## 2014-04-12 ENCOUNTER — Other Ambulatory Visit: Payer: Self-pay | Admitting: *Deleted

## 2014-04-18 ENCOUNTER — Other Ambulatory Visit: Payer: Self-pay | Admitting: Medical Oncology

## 2014-04-18 ENCOUNTER — Ambulatory Visit (HOSPITAL_BASED_OUTPATIENT_CLINIC_OR_DEPARTMENT_OTHER): Payer: Medicare Other

## 2014-04-18 ENCOUNTER — Encounter: Payer: Self-pay | Admitting: Medical Oncology

## 2014-04-18 VITALS — BP 159/63 | HR 59 | Temp 98.1°F | Resp 20

## 2014-04-18 DIAGNOSIS — E860 Dehydration: Secondary | ICD-10-CM

## 2014-04-18 DIAGNOSIS — C187 Malignant neoplasm of sigmoid colon: Secondary | ICD-10-CM

## 2014-04-18 DIAGNOSIS — C189 Malignant neoplasm of colon, unspecified: Secondary | ICD-10-CM

## 2014-04-18 MED ORDER — HEPARIN SOD (PORK) LOCK FLUSH 100 UNIT/ML IV SOLN
500.0000 [IU] | Freq: Once | INTRAVENOUS | Status: AC
  Administered 2014-04-18: 500 [IU] via INTRAVENOUS
  Filled 2014-04-18: qty 5

## 2014-04-18 MED ORDER — SODIUM CHLORIDE 0.9 % IV SOLN
1000.0000 mL | Freq: Once | INTRAVENOUS | Status: AC
  Administered 2014-04-18: 1000 mL via INTRAVENOUS

## 2014-04-18 MED ORDER — SODIUM CHLORIDE 0.9 % IJ SOLN
10.0000 mL | INTRAMUSCULAR | Status: DC | PRN
  Administered 2014-04-18: 10 mL via INTRAVENOUS
  Filled 2014-04-18: qty 10

## 2014-04-18 NOTE — Patient Instructions (Signed)

## 2014-04-18 NOTE — Progress Notes (Unsigned)
Patient here today for IVF's, spouse wanting to talk with this nurse regarding patient's cancer stage and the effects chemo treatment has had on patient this far. Spouse concerned of the side effects that treatment has had on patient and his quality of life, as well as patient has voiced concerns that the treatment's have been difficult on him and he's not sure he wishes to continue with if he will be having this much difficulty with the s/e. Discussed with wife that symptoms after the first few symptoms may improve once his body has becomes adjusted to the treatment and the importance to take his antinausea meds as prescribed, also informed her that his lab counts are expected to drop and this is normal. Wife concerned that when patient isn't getting treatment, d/t his lab counts, the effect it has on the tumor growth. Informed wife that these questions require Dr. [name] to counsel with them. Suggested to wife that she and patient discuss indepth and bring their concerns to pt's scheduled appt 12/15. Wife expressed thanks and understanding.  Mssg given to Dr. [name].

## 2014-04-24 ENCOUNTER — Ambulatory Visit: Payer: Medicare Other

## 2014-04-24 ENCOUNTER — Other Ambulatory Visit (HOSPITAL_BASED_OUTPATIENT_CLINIC_OR_DEPARTMENT_OTHER): Payer: Medicare Other

## 2014-04-24 ENCOUNTER — Encounter: Payer: Self-pay | Admitting: Physician Assistant

## 2014-04-24 ENCOUNTER — Ambulatory Visit (HOSPITAL_BASED_OUTPATIENT_CLINIC_OR_DEPARTMENT_OTHER): Payer: Medicare Other

## 2014-04-24 ENCOUNTER — Ambulatory Visit (HOSPITAL_BASED_OUTPATIENT_CLINIC_OR_DEPARTMENT_OTHER): Payer: Medicare Other | Admitting: Physician Assistant

## 2014-04-24 VITALS — BP 152/62 | HR 46 | Temp 98.1°F | Resp 18 | Ht 69.0 in | Wt 185.7 lb

## 2014-04-24 DIAGNOSIS — C787 Secondary malignant neoplasm of liver and intrahepatic bile duct: Secondary | ICD-10-CM

## 2014-04-24 DIAGNOSIS — C189 Malignant neoplasm of colon, unspecified: Secondary | ICD-10-CM

## 2014-04-24 DIAGNOSIS — Z5111 Encounter for antineoplastic chemotherapy: Secondary | ICD-10-CM

## 2014-04-24 DIAGNOSIS — D649 Anemia, unspecified: Secondary | ICD-10-CM

## 2014-04-24 DIAGNOSIS — R110 Nausea: Secondary | ICD-10-CM

## 2014-04-24 DIAGNOSIS — C187 Malignant neoplasm of sigmoid colon: Secondary | ICD-10-CM

## 2014-04-24 DIAGNOSIS — R160 Hepatomegaly, not elsewhere classified: Secondary | ICD-10-CM

## 2014-04-24 DIAGNOSIS — Z95828 Presence of other vascular implants and grafts: Secondary | ICD-10-CM

## 2014-04-24 DIAGNOSIS — C799 Secondary malignant neoplasm of unspecified site: Secondary | ICD-10-CM

## 2014-04-24 LAB — CBC WITH DIFFERENTIAL/PLATELET
BASO%: 0.1 % (ref 0.0–2.0)
Basophils Absolute: 0 10*3/uL (ref 0.0–0.1)
EOS%: 0.8 % (ref 0.0–7.0)
Eosinophils Absolute: 0.1 10*3/uL (ref 0.0–0.5)
HCT: 37.7 % — ABNORMAL LOW (ref 38.4–49.9)
HGB: 11.7 g/dL — ABNORMAL LOW (ref 13.0–17.1)
LYMPH%: 19.7 % (ref 14.0–49.0)
MCH: 26.4 pg — ABNORMAL LOW (ref 27.2–33.4)
MCHC: 31 g/dL — AB (ref 32.0–36.0)
MCV: 85.1 fL (ref 79.3–98.0)
MONO#: 1.7 10*3/uL — AB (ref 0.1–0.9)
MONO%: 21.3 % — AB (ref 0.0–14.0)
NEUT#: 4.5 10*3/uL (ref 1.5–6.5)
NEUT%: 58.1 % (ref 39.0–75.0)
NRBC: 0 % (ref 0–0)
PLATELETS: 155 10*3/uL (ref 140–400)
RBC: 4.43 10*6/uL (ref 4.20–5.82)
RDW: 18.9 % — AB (ref 11.0–14.6)
WBC: 7.8 10*3/uL (ref 4.0–10.3)
lymph#: 1.5 10*3/uL (ref 0.9–3.3)

## 2014-04-24 LAB — COMPREHENSIVE METABOLIC PANEL (CC13)
ALK PHOS: 207 U/L — AB (ref 40–150)
ALT: 14 U/L (ref 0–55)
AST: 16 U/L (ref 5–34)
Albumin: 2.6 g/dL — ABNORMAL LOW (ref 3.5–5.0)
Anion Gap: 10 mEq/L (ref 3–11)
BILIRUBIN TOTAL: 0.56 mg/dL (ref 0.20–1.20)
BUN: 9.6 mg/dL (ref 7.0–26.0)
CO2: 24 meq/L (ref 22–29)
Calcium: 8.3 mg/dL — ABNORMAL LOW (ref 8.4–10.4)
Chloride: 106 mEq/L (ref 98–109)
Creatinine: 0.8 mg/dL (ref 0.7–1.3)
EGFR: >90 mL/min/{1.73_m2} (ref >90)
Glucose: 91 mg/dl (ref 70–140)
POTASSIUM: 4.1 meq/L (ref 3.5–5.1)
SODIUM: 140 meq/L (ref 136–145)
TOTAL PROTEIN: 6.1 g/dL — AB (ref 6.4–8.3)

## 2014-04-24 LAB — UA PROTEIN, DIPSTICK - CHCC: Protein, ur: 30 mg/dL

## 2014-04-24 MED ORDER — SODIUM CHLORIDE 0.9 % IJ SOLN
10.0000 mL | INTRAMUSCULAR | Status: DC | PRN
  Administered 2014-04-24: 10 mL via INTRAVENOUS
  Filled 2014-04-24: qty 10

## 2014-04-24 MED ORDER — LEUCOVORIN CALCIUM INJECTION 350 MG
400.0000 mg/m2 | Freq: Once | INTRAVENOUS | Status: AC
  Administered 2014-04-24: 868 mg via INTRAVENOUS
  Filled 2014-04-24: qty 43.4

## 2014-04-24 MED ORDER — FLUOROURACIL CHEMO INJECTION 2.5 GM/50ML
400.0000 mg/m2 | Freq: Once | INTRAVENOUS | Status: AC
  Administered 2014-04-24: 850 mg via INTRAVENOUS
  Filled 2014-04-24: qty 17

## 2014-04-24 MED ORDER — ONDANSETRON 16 MG/50ML IVPB (CHCC)
INTRAVENOUS | Status: AC
  Filled 2014-04-24: qty 16

## 2014-04-24 MED ORDER — ONDANSETRON 16 MG/50ML IVPB (CHCC)
16.0000 mg | Freq: Once | INTRAVENOUS | Status: AC
  Administered 2014-04-24: 16 mg via INTRAVENOUS

## 2014-04-24 MED ORDER — SODIUM CHLORIDE 0.9 % IV SOLN
Freq: Once | INTRAVENOUS | Status: AC
  Administered 2014-04-24: 13:00:00 via INTRAVENOUS

## 2014-04-24 MED ORDER — ATROPINE SULFATE 1 MG/ML IJ SOLN
INTRAMUSCULAR | Status: AC
  Filled 2014-04-24: qty 1

## 2014-04-24 MED ORDER — ATROPINE SULFATE 1 MG/ML IJ SOLN
0.5000 mg | Freq: Once | INTRAMUSCULAR | Status: AC | PRN
  Administered 2014-04-24: 0.5 mg via INTRAVENOUS

## 2014-04-24 MED ORDER — DEXAMETHASONE SODIUM PHOSPHATE 20 MG/5ML IJ SOLN
20.0000 mg | Freq: Once | INTRAMUSCULAR | Status: AC
  Administered 2014-04-24: 20 mg via INTRAVENOUS

## 2014-04-24 MED ORDER — DEXAMETHASONE SODIUM PHOSPHATE 20 MG/5ML IJ SOLN
INTRAMUSCULAR | Status: AC
  Filled 2014-04-24: qty 5

## 2014-04-24 MED ORDER — IRINOTECAN HCL CHEMO INJECTION 100 MG/5ML
129.0000 mg/m2 | Freq: Once | INTRAVENOUS | Status: AC
  Administered 2014-04-24: 280 mg via INTRAVENOUS
  Filled 2014-04-24: qty 14

## 2014-04-24 MED ORDER — SODIUM CHLORIDE 0.9 % IV SOLN
5.0000 mg/kg | Freq: Once | INTRAVENOUS | Status: AC
  Administered 2014-04-24: 475 mg via INTRAVENOUS
  Filled 2014-04-24: qty 19

## 2014-04-24 MED ORDER — SODIUM CHLORIDE 0.9 % IV SOLN
2400.0000 mg/m2 | INTRAVENOUS | Status: DC
  Administered 2014-04-24: 5200 mg via INTRAVENOUS
  Filled 2014-04-24: qty 104

## 2014-04-24 NOTE — Progress Notes (Signed)
Hematology and Oncology Follow Up Visit  Frank [name] [id number] 26-Mar-1946 68 y.o. 04/24/2014 1:45 PM Frank [name], MDBrackbill, Frank Moores, MD   Principle Diagnosis: 68 year old gentleman with the diagnosis of stage IV colon cancer diagnosed in October 2015. He presented with sigmoid colon lesion and liver metastasis.   Prior Therapy: He is status post liver biopsy on 03/01/2014 confirmed the presence of metastatic adenocarcinoma. He is status post IV iron in the form of Feraheme on 03/16/2014 due to anemia.  Current therapy: FOLFIRI and a Avastin started on 03/27/2014 and he is here for cycle 2.  Interim History:  Mr. [employer] presents today for a follow-up visit accompanied by his significant other. Since the last visit, he received chemotherapy with few complications. He states that his Port-A-Cath was sore for several days after it was last accessed for chemotherapy. Denied any redness and did not experience any oozing of either sero-sanguineous or purulent drainage. He reported mild nausea but no vomiting. He also reported grade 1 diarrhea that responded to Imodium. He now reports issues with constipation. He does not care for MiraLax and is wondering what else he can take to deal with the constipation.  He does not report any headaches or blurry vision or syncope. He does not report any fevers or chills or sweats. He does report poor by mouth intake and weight loss. He does not report any chest pain, palpitation or orthopnea. Does not report any shortness of breath or dyspnea on exertion. He does not report any abdominal distention or early satiety. He does not report any frequency, urgency or hesitancy. He does not report any skeletal complaints. He does not report any petechiae or lymphadenopathy. Remainder of his review of systems unremarkable.  Medications: I have reviewed the patient's current medications.  Current Outpatient Prescriptions  Medication Sig Dispense Refill  . atenolol  (TENORMIN) 25 MG tablet Take 1 tablet (25 mg total) by mouth 2 (two) times daily. 60 tablet 0  . docusate sodium (COLACE) 100 MG capsule Take 200 mg by mouth daily.    . ferrous sulfate 325 (65 FE) MG EC tablet Take 1 tablet (325 mg total) by mouth daily with breakfast. 30 tablet 0  . furosemide (LASIX) 40 MG tablet Take 1 tablet (40 mg total) by mouth daily. 30 tablet 5  . lidocaine-prilocaine (EMLA) cream Apply 1 application topically as needed. On treatment or lab days, place on Port site 1-2 hours prior to appts. 30 g 1  . Multiple Vitamin (MULTIVITAMIN WITH MINERALS) TABS tablet Take 1 tablet by mouth daily.    . ondansetron (ZOFRAN ODT) 8 MG disintegrating tablet Take 1 tablet (8 mg total) by mouth every 8 (eight) hours as needed for nausea or vomiting. 20 tablet 0  . oxyCODONE-acetaminophen (PERCOCET) 7.5-325 MG per tablet Take 1 tablet by mouth every 4 (four) hours as needed for pain. 30 tablet 0  . potassium chloride SA (K-DUR,KLOR-CON) 20 MEQ tablet Patient to take a total of 4 tablets spaced over the next 24 hours and then 1 daily 40 tablet 0  . potassium chloride SA (K-DUR,KLOR-CON) 20 MEQ tablet Take 1 tablet (20 mEq total) by mouth daily. 14 tablet 1  . prochlorperazine (COMPAZINE) 10 MG tablet Take 1 tablet (10 mg total) by mouth every 6 (six) hours as needed for nausea or vomiting. 30 tablet 1  . ramipril (ALTACE) 5 MG capsule Take 1 capsule (5 mg total) by mouth daily. 30 capsule 5  . tadalafil (CIALIS) 20 MG tablet Take  1 tablet (20 mg total) by mouth as needed. 10 tablet prn  . vitamin C (ASCORBIC ACID) 500 MG tablet Take 500 mg by mouth 2 (two) times daily.     No current facility-administered medications for this visit.   Facility-Administered Medications Ordered in Other Visits  Medication Dose Route Frequency Provider Last Rate Last Dose  . atropine injection 0.5 mg  0.5 mg Intravenous Once PRN Wyatt Portela, MD      . bevacizumab (AVASTIN) 475 mg in sodium chloride 0.9 %  100 mL chemo infusion  5 mg/kg (Treatment Plan Actual) Intravenous Once Wyatt Portela, MD      . fluorouracil (ADRUCIL) 5,200 mg in sodium chloride 0.9 % 150 mL chemo infusion  2,400 mg/m2 (Treatment Plan Actual) Intravenous 1 day or 1 dose Wyatt Portela, MD      . fluorouracil (ADRUCIL) chemo injection 850 mg  400 mg/m2 (Treatment Plan Actual) Intravenous Once Wyatt Portela, MD      . irinotecan (CAMPTOSAR) 280 mg in dextrose 5 % 500 mL chemo infusion  129 mg/m2 (Treatment Plan Actual) Intravenous Once Wyatt Portela, MD      . leucovorin 868 mg in dextrose 5 % 250 mL infusion  400 mg/m2 (Treatment Plan Actual) Intravenous Once Wyatt Portela, MD         Allergies:  Allergies  Allergen Reactions  . Simvastatin Other (See Comments)    "Muscle aches"    Past Medical History, Surgical history, Social history, and Family History were reviewed and updated.   Physical Exam: Blood pressure 152/62, pulse 46, temperature 98.1 F (36.7 C), temperature source Oral, resp. rate 18, height _0  (1.753 m), weight 185 lb 11.2 oz (84.233 kg). ECOG: 1 General appearance: alert and cooperative Head: Normocephalic, without obvious abnormality Neck: no adenopathy Lymph nodes: Cervical, supraclavicular, and axillary nodes normal. Heart:regular rate and rhythm, S1, S2 normal, no murmur, click, rub or gallop Lung:chest clear, no wheezing, rales, normal symmetric air entry Abdomin: soft, non-tender, without masses or organomegaly EXT:no erythema, induration, or nodules Right anterior chest Port-A-Cath-non-tender, no evidence of infection  Lab Results: Lab Results  Component Value Date   WBC 7.8 04/24/2014   HGB 11.7* 04/24/2014   HCT 37.7* 04/24/2014   MCV 85.1 04/24/2014   PLT 155 04/24/2014     Chemistry      Component Value Date/Time   NA 140 04/24/2014 1021   NA 137 03/19/2014 1229   K 4.1 04/24/2014 1021   K 4.0 03/19/2014 1229   CL 98 03/19/2014 1229   CO2 24 04/24/2014 1021   CO2  24 03/19/2014 1229   BUN 9.6 04/24/2014 1021   BUN 15 03/19/2014 1229   CREATININE 0.8 04/24/2014 1021   CREATININE 0.98 03/19/2014 1229      Component Value Date/Time   CALCIUM 8.3* 04/24/2014 1021   CALCIUM 8.9 03/19/2014 1229   ALKPHOS 207* 04/24/2014 1021   ALKPHOS 351* 03/09/2014 0851   AST 16 04/24/2014 1021   AST 26 03/09/2014 0851   ALT 14 04/24/2014 1021   ALT 22 03/09/2014 0851   BILITOT 0.56 04/24/2014 1021   BILITOT 1.8* 03/09/2014 0851       Impression and Plan:   68 year old gentleman with the following issues:  1. Metastatic colon cancer presented with hypermetabolic lesion in the sigmoid colon with liver metastasis. Biopsy proven to confirm the presence of adenocarcinoma of a colorectal primary. His K-ras mutation is currently pending. He is currently on  chemotherapy in the form of FOLFIRI and a Avastin started on 03/27/2014. He is ready to proceed with cycle 2 if his laboratory testing are adequate at this time.  2. IV access: He has a Port-A-Cath inserted without any complications.  3. Antiemetics: I gave a prescription for Zofran ODT for better tolerance as he is having difficulty with swallowing.  4. Dehydration: Patient is encouraged to push by mouth fluids. He may need IV fluids intermittently throughout treatment  5. Primary tumor management: There is no urgency to address his primary tumor unless he becomes symptomatic from it. He does not have any symptoms of obstruction or pain.  6. Constipation: Patient may take Senokot S or Dulcolax as needed for constipation. He is encouraged to increase his by mouth intake of food and fluids in general.  7. Follow-up: Will be in 2 weeks for the next cycle of chemotherapy.   Wynetta Emery, Bayli Quesinberry E, PA-C  12/15/20151:45 PM

## 2014-04-24 NOTE — Progress Notes (Signed)
Patient in for Port-A-Cath access and labs today. Port accessed and flushed without any difficulty. Port flushed with 10 mL of Normal Saline and 10 mL of blood obtained for waste amount, but not enough blood return for labs obtained. Awilda Metro, PA nurse made aware. Infusion Charge Nurse made aware.

## 2014-04-24 NOTE — Patient Instructions (Signed)
[facility] Discharge Instructions for Patients Receiving Chemotherapy  Today you received the following chemotherapy agents: Avastin, Camptosar, Leukovorin, 5FU  To help prevent nausea and vomiting after your treatment, we encourage you to take your nausea medication: compazine 10 mg every 6 hours as ordered.   If you develop nausea and vomiting that is not controlled by your nausea medication, call the clinic.   BELOW ARE SYMPTOMS THAT SHOULD BE REPORTED IMMEDIATELY:  *FEVER GREATER THAN 100.5 F  *CHILLS WITH OR WITHOUT FEVER  NAUSEA AND VOMITING THAT IS NOT CONTROLLED WITH YOUR NAUSEA MEDICATION  *UNUSUAL SHORTNESS OF BREATH  *UNUSUAL BRUISING OR BLEEDING  TENDERNESS IN MOUTH AND THROAT WITH OR WITHOUT PRESENCE OF ULCERS  *URINARY PROBLEMS  *BOWEL PROBLEMS  UNUSUAL RASH Items with * indicate a potential emergency and should be followed up as soon as possible.  Feel free to call the clinic you have any questions or concerns. The clinic phone number is [phone].

## 2014-04-24 NOTE — Patient Instructions (Signed)
Increase your intake of both food and fluids as discussed You may take either Senokot-S or Dulcolax as needed for constipation Follow-up in 2 weeks, prior to next scheduled cycle of chemotherapy

## 2014-04-24 NOTE — Patient Instructions (Signed)

## 2014-04-25 ENCOUNTER — Other Ambulatory Visit: Payer: Self-pay | Admitting: Cardiology

## 2014-04-25 LAB — CEA: CEA: 673.7 ng/mL — ABNORMAL HIGH (ref 0.0–5.0)

## 2014-04-26 ENCOUNTER — Ambulatory Visit (HOSPITAL_BASED_OUTPATIENT_CLINIC_OR_DEPARTMENT_OTHER): Payer: Medicare Other

## 2014-04-26 DIAGNOSIS — R160 Hepatomegaly, not elsewhere classified: Secondary | ICD-10-CM

## 2014-04-26 DIAGNOSIS — C187 Malignant neoplasm of sigmoid colon: Secondary | ICD-10-CM

## 2014-04-26 MED ORDER — HEPARIN SOD (PORK) LOCK FLUSH 100 UNIT/ML IV SOLN
500.0000 [IU] | Freq: Once | INTRAVENOUS | Status: AC | PRN
  Administered 2014-04-26: 500 [IU]
  Filled 2014-04-26: qty 5

## 2014-04-26 MED ORDER — SODIUM CHLORIDE 0.9 % IJ SOLN
10.0000 mL | INTRAMUSCULAR | Status: DC | PRN
  Administered 2014-04-26: 10 mL
  Filled 2014-04-26: qty 10

## 2014-04-30 ENCOUNTER — Other Ambulatory Visit: Payer: Self-pay | Admitting: *Deleted

## 2014-04-30 MED ORDER — LORAZEPAM 0.5 MG PO TABS: 0.5000 mg | ORAL_TABLET | ORAL | Status: AC | PRN

## 2014-04-30 NOTE — Telephone Encounter (Signed)
Pt's family called at 53, states patient has been weak and nauseated since Saturday. Unable to keep anything down. Cannot use zofran as it makes him nauseated. Has been taking compazine but it is not helping.  RN spoke with Selena Lesser, NP and Ativan .5mg  every 4 hours as needed for nausea called into patient's pharmacy. Instructed family to have patient take ativan every 4 hours as needed. Asked to call tomorrow morning with an update. If unable to drink or eat, we could bring him into see NP and possible IVF. Verbalized understanding.

## 2014-05-02 ENCOUNTER — Encounter: Payer: Self-pay | Admitting: Nurse Practitioner

## 2014-05-02 ENCOUNTER — Ambulatory Visit (HOSPITAL_BASED_OUTPATIENT_CLINIC_OR_DEPARTMENT_OTHER): Payer: Medicare Other

## 2014-05-02 ENCOUNTER — Inpatient Hospital Stay (HOSPITAL_COMMUNITY)
Admission: AD | Admit: 2014-05-02 | Discharge: 2014-05-21 | DRG: 329 | Disposition: A | Discharge status: Hospice | Payer: Medicare Other | Source: Ambulatory Visit | Attending: Family Medicine | Admitting: Family Medicine

## 2014-05-02 ENCOUNTER — Encounter (HOSPITAL_COMMUNITY): Payer: Self-pay | Admitting: *Deleted

## 2014-05-02 ENCOUNTER — Ambulatory Visit (HOSPITAL_BASED_OUTPATIENT_CLINIC_OR_DEPARTMENT_OTHER): Payer: Medicare Other | Admitting: Lab

## 2014-05-02 ENCOUNTER — Ambulatory Visit (HOSPITAL_BASED_OUTPATIENT_CLINIC_OR_DEPARTMENT_OTHER): Payer: Medicare Other | Admitting: Nurse Practitioner

## 2014-05-02 ENCOUNTER — Inpatient Hospital Stay (HOSPITAL_COMMUNITY): Payer: Medicare Other

## 2014-05-02 ENCOUNTER — Other Ambulatory Visit: Payer: Self-pay | Admitting: *Deleted

## 2014-05-02 VITALS — BP 146/68 | HR 114 | Temp 97.7°F | Resp 18

## 2014-05-02 DIAGNOSIS — C187 Malignant neoplasm of sigmoid colon: Principal | ICD-10-CM | POA: Diagnosis present

## 2014-05-02 DIAGNOSIS — Z79899 Other long term (current) drug therapy: Secondary | ICD-10-CM | POA: Diagnosis not present

## 2014-05-02 DIAGNOSIS — E8809 Other disorders of plasma-protein metabolism, not elsewhere classified: Secondary | ICD-10-CM | POA: Diagnosis not present

## 2014-05-02 DIAGNOSIS — R109 Unspecified abdominal pain: Secondary | ICD-10-CM | POA: Diagnosis not present

## 2014-05-02 DIAGNOSIS — E876 Hypokalemia: Secondary | ICD-10-CM | POA: Insufficient documentation

## 2014-05-02 DIAGNOSIS — I2510 Atherosclerotic heart disease of native coronary artery without angina pectoris: Secondary | ICD-10-CM | POA: Diagnosis present

## 2014-05-02 DIAGNOSIS — K1231 Oral mucositis (ulcerative) due to antineoplastic therapy: Secondary | ICD-10-CM

## 2014-05-02 DIAGNOSIS — R188 Other ascites: Secondary | ICD-10-CM | POA: Diagnosis present

## 2014-05-02 DIAGNOSIS — R4182 Altered mental status, unspecified: Secondary | ICD-10-CM

## 2014-05-02 DIAGNOSIS — I10 Essential (primary) hypertension: Secondary | ICD-10-CM | POA: Diagnosis present

## 2014-05-02 DIAGNOSIS — R531 Weakness: Secondary | ICD-10-CM | POA: Insufficient documentation

## 2014-05-02 DIAGNOSIS — R748 Abnormal levels of other serum enzymes: Secondary | ICD-10-CM | POA: Diagnosis not present

## 2014-05-02 DIAGNOSIS — R112 Nausea with vomiting, unspecified: Secondary | ICD-10-CM

## 2014-05-02 DIAGNOSIS — Z6828 Body mass index (BMI) 28.0-28.9, adult: Secondary | ICD-10-CM

## 2014-05-02 DIAGNOSIS — Z515 Encounter for palliative care: Secondary | ICD-10-CM | POA: Diagnosis not present

## 2014-05-02 DIAGNOSIS — E785 Hyperlipidemia, unspecified: Secondary | ICD-10-CM | POA: Diagnosis present

## 2014-05-02 DIAGNOSIS — Z951 Presence of aortocoronary bypass graft: Secondary | ICD-10-CM | POA: Diagnosis not present

## 2014-05-02 DIAGNOSIS — C787 Secondary malignant neoplasm of liver and intrahepatic bile duct: Secondary | ICD-10-CM | POA: Diagnosis present

## 2014-05-02 DIAGNOSIS — D61818 Other pancytopenia: Secondary | ICD-10-CM | POA: Insufficient documentation

## 2014-05-02 DIAGNOSIS — R1030 Lower abdominal pain, unspecified: Secondary | ICD-10-CM

## 2014-05-02 DIAGNOSIS — C189 Malignant neoplasm of colon, unspecified: Secondary | ICD-10-CM

## 2014-05-02 DIAGNOSIS — Z87891 Personal history of nicotine dependence: Secondary | ICD-10-CM

## 2014-05-02 DIAGNOSIS — N5089 Other specified disorders of the male genital organs: Secondary | ICD-10-CM

## 2014-05-02 DIAGNOSIS — F4323 Adjustment disorder with mixed anxiety and depressed mood: Secondary | ICD-10-CM | POA: Diagnosis present

## 2014-05-02 DIAGNOSIS — E860 Dehydration: Secondary | ICD-10-CM | POA: Diagnosis present

## 2014-05-02 DIAGNOSIS — E8770 Fluid overload, unspecified: Secondary | ICD-10-CM | POA: Diagnosis not present

## 2014-05-02 DIAGNOSIS — Z809 Family history of malignant neoplasm, unspecified: Secondary | ICD-10-CM | POA: Diagnosis not present

## 2014-05-02 DIAGNOSIS — K5900 Constipation, unspecified: Secondary | ICD-10-CM | POA: Diagnosis present

## 2014-05-02 DIAGNOSIS — R1084 Generalized abdominal pain: Secondary | ICD-10-CM

## 2014-05-02 DIAGNOSIS — K651 Peritoneal abscess: Secondary | ICD-10-CM | POA: Diagnosis present

## 2014-05-02 DIAGNOSIS — D62 Acute posthemorrhagic anemia: Secondary | ICD-10-CM | POA: Diagnosis not present

## 2014-05-02 DIAGNOSIS — R410 Disorientation, unspecified: Secondary | ICD-10-CM

## 2014-05-02 DIAGNOSIS — E669 Obesity, unspecified: Secondary | ICD-10-CM | POA: Diagnosis present

## 2014-05-02 DIAGNOSIS — R160 Hepatomegaly, not elsewhere classified: Secondary | ICD-10-CM

## 2014-05-02 DIAGNOSIS — R600 Localized edema: Secondary | ICD-10-CM | POA: Diagnosis not present

## 2014-05-02 DIAGNOSIS — K631 Perforation of intestine (nontraumatic): Secondary | ICD-10-CM

## 2014-05-02 DIAGNOSIS — R630 Anorexia: Secondary | ICD-10-CM | POA: Diagnosis not present

## 2014-05-02 DIAGNOSIS — R1110 Vomiting, unspecified: Secondary | ICD-10-CM

## 2014-05-02 DIAGNOSIS — N508 Other specified disorders of male genital organs: Secondary | ICD-10-CM | POA: Diagnosis present

## 2014-05-02 DIAGNOSIS — R634 Abnormal weight loss: Secondary | ICD-10-CM

## 2014-05-02 DIAGNOSIS — D61810 Antineoplastic chemotherapy induced pancytopenia: Secondary | ICD-10-CM | POA: Diagnosis present

## 2014-05-02 DIAGNOSIS — T451X5A Adverse effect of antineoplastic and immunosuppressive drugs, initial encounter: Secondary | ICD-10-CM | POA: Diagnosis not present

## 2014-05-02 DIAGNOSIS — Z66 Do not resuscitate: Secondary | ICD-10-CM | POA: Diagnosis present

## 2014-05-02 DIAGNOSIS — Z452 Encounter for adjustment and management of vascular access device: Secondary | ICD-10-CM

## 2014-05-02 DIAGNOSIS — K658 Other peritonitis: Secondary | ICD-10-CM | POA: Diagnosis not present

## 2014-05-02 DIAGNOSIS — G9340 Encephalopathy, unspecified: Secondary | ICD-10-CM | POA: Diagnosis present

## 2014-05-02 DIAGNOSIS — Z888 Allergy status to other drugs, medicaments and biological substances status: Secondary | ICD-10-CM | POA: Diagnosis not present

## 2014-05-02 DIAGNOSIS — R000 Tachycardia, unspecified: Secondary | ICD-10-CM | POA: Diagnosis present

## 2014-05-02 DIAGNOSIS — Z9889 Other specified postprocedural states: Secondary | ICD-10-CM

## 2014-05-02 DIAGNOSIS — E43 Unspecified severe protein-calorie malnutrition: Secondary | ICD-10-CM | POA: Diagnosis present

## 2014-05-02 DIAGNOSIS — I5032 Chronic diastolic (congestive) heart failure: Secondary | ICD-10-CM | POA: Diagnosis present

## 2014-05-02 HISTORY — DX: Failed or difficult intubation, initial encounter: T88.4XXA

## 2014-05-02 LAB — COMPREHENSIVE METABOLIC PANEL (CC13)
ALK PHOS: 104 U/L (ref 40–150)
ALT: 9 U/L (ref 0–55)
AST: 9 U/L (ref 5–34)
Albumin: 2 g/dL — ABNORMAL LOW (ref 3.5–5.0)
Anion Gap: 10 mEq/L (ref 3–11)
BILIRUBIN TOTAL: 0.79 mg/dL (ref 0.20–1.20)
BUN: 23.7 mg/dL (ref 7.0–26.0)
CO2: 22 mEq/L (ref 22–29)
Calcium: 8.4 mg/dL (ref 8.4–10.4)
Chloride: 104 mEq/L (ref 98–109)
Creatinine: 1 mg/dL (ref 0.7–1.3)
EGFR: 76 mL/min/{1.73_m2} — AB (ref >90)
GLUCOSE: 145 mg/dL — AB (ref 70–140)
Potassium: 4 mEq/L (ref 3.5–5.1)
Sodium: 136 mEq/L (ref 136–145)
Total Protein: 5.6 g/dL — ABNORMAL LOW (ref 6.4–8.3)

## 2014-05-02 LAB — CBC WITH DIFFERENTIAL/PLATELET
BASO%: 0 % (ref 0.0–2.0)
BASOS ABS: 0 10*3/uL (ref 0.0–0.1)
EOS%: 0 % (ref 0.0–7.0)
Eosinophils Absolute: 0 10*3/uL (ref 0.0–0.5)
HCT: 30.7 % — ABNORMAL LOW (ref 38.4–49.9)
HGB: 9.8 g/dL — ABNORMAL LOW (ref 13.0–17.1)
LYMPH%: 8.9 % — ABNORMAL LOW (ref 14.0–49.0)
MCH: 26.8 pg — AB (ref 27.2–33.4)
MCHC: 31.9 g/dL — ABNORMAL LOW (ref 32.0–36.0)
MCV: 84.1 fL (ref 79.3–98.0)
MONO#: 0.2 10*3/uL (ref 0.1–0.9)
MONO%: 14.3 % — AB (ref 0.0–14.0)
NEUT#: 0.9 10*3/uL — ABNORMAL LOW (ref 1.5–6.5)
NEUT%: 76.8 % — ABNORMAL HIGH (ref 39.0–75.0)
NRBC: 1 % — AB (ref 0–0)
Platelets: 85 10*3/uL — ABNORMAL LOW (ref 140–400)
RBC: 3.65 10*6/uL — ABNORMAL LOW (ref 4.20–5.82)
RDW: 17.2 % — AB (ref 11.0–14.6)
WBC: 1.1 10*3/uL — ABNORMAL LOW (ref 4.0–10.3)
lymph#: 0.1 10*3/uL — ABNORMAL LOW (ref 0.9–3.3)

## 2014-05-02 LAB — TECHNOLOGIST REVIEW

## 2014-05-02 LAB — GLUCOSE, CAPILLARY: Glucose-Capillary: 106 mg/dL — ABNORMAL HIGH (ref 70–99)

## 2014-05-02 MED ORDER — ACETAMINOPHEN 650 MG RE SUPP: 650.0000 mg | Freq: Four times a day (QID) | RECTAL | Status: DC | PRN

## 2014-05-02 MED ORDER — MORPHINE SULFATE 4 MG/ML IJ SOLN
INTRAMUSCULAR | Status: AC
  Filled 2014-05-02: qty 1

## 2014-05-02 MED ORDER — ACETAMINOPHEN 325 MG PO TABS
650.0000 mg | ORAL_TABLET | Freq: Four times a day (QID) | ORAL | Status: DC | PRN
Start: 2014-05-02 — End: 2014-05-03

## 2014-05-02 MED ORDER — METRONIDAZOLE IN NACL 5-0.79 MG/ML-% IV SOLN: 500.0000 mg | Freq: Three times a day (TID) | INTRAVENOUS | Status: DC

## 2014-05-02 MED ORDER — LORAZEPAM 0.5 MG PO TABS: 0.5000 mg | ORAL_TABLET | Freq: Two times a day (BID) | ORAL | Status: DC | PRN

## 2014-05-02 MED ORDER — MORPHINE SULFATE 2 MG/ML IJ SOLN
2.0000 mg | INTRAMUSCULAR | Status: DC | PRN
  Administered 2014-05-02 – 2014-05-03 (×5): 2 mg via INTRAVENOUS
  Filled 2014-05-02 (×5): qty 1

## 2014-05-02 MED ORDER — OXYCODONE-ACETAMINOPHEN 5-325 MG PO TABS: 1.0000 | ORAL_TABLET | ORAL | Status: DC | PRN

## 2014-05-02 MED ORDER — SODIUM CHLORIDE 0.9 % IJ SOLN
3.0000 mL | Freq: Two times a day (BID) | INTRAMUSCULAR | Status: DC
  Administered 2014-05-03 – 2014-05-04 (×2): 3 mL via INTRAVENOUS

## 2014-05-02 MED ORDER — ONDANSETRON HCL 4 MG PO TABS: 4.0000 mg | ORAL_TABLET | Freq: Four times a day (QID) | ORAL | Status: DC | PRN

## 2014-05-02 MED ORDER — ONDANSETRON HCL 4 MG/2ML IJ SOLN
4.0000 mg | Freq: Four times a day (QID) | INTRAMUSCULAR | Status: DC | PRN
  Administered 2014-05-02 – 2014-05-13 (×6): 4 mg via INTRAVENOUS
  Filled 2014-05-02 (×6): qty 2

## 2014-05-02 MED ORDER — SODIUM CHLORIDE 0.9 % IJ SOLN
10.0000 mL | INTRAMUSCULAR | Status: DC | PRN
  Administered 2014-05-03 – 2014-05-16 (×5): 10 mL
  Administered 2014-05-16: 20 mL
  Administered 2014-05-17 – 2014-05-21 (×5): 10 mL
  Filled 2014-05-02 (×11): qty 40

## 2014-05-02 MED ORDER — OXYCODONE HCL 5 MG PO TABS: 2.5000 mg | ORAL_TABLET | ORAL | Status: DC | PRN

## 2014-05-02 MED ORDER — MORPHINE SULFATE 4 MG/ML IJ SOLN
2.0000 mg | Freq: Once | INTRAMUSCULAR | Status: AC
  Administered 2014-05-02 (×2): 2 mg via INTRAVENOUS

## 2014-05-02 MED ORDER — ONDANSETRON 8 MG/50ML IVPB (CHCC)
8.0000 mg | Freq: Once | INTRAVENOUS | Status: AC
  Administered 2014-05-02: 8 mg via INTRAVENOUS

## 2014-05-02 MED ORDER — ONDANSETRON 8 MG/NS 50 ML IVPB
INTRAVENOUS | Status: AC
  Filled 2014-05-02: qty 8

## 2014-05-02 MED ORDER — OXYCODONE-ACETAMINOPHEN 7.5-325 MG PO TABS: 1.0000 | ORAL_TABLET | ORAL | Status: DC | PRN

## 2014-05-02 MED ORDER — MAGIC MOUTHWASH W/LIDOCAINE
15.0000 mL | Freq: Four times a day (QID) | ORAL | Status: DC | PRN
  Filled 2014-05-02: qty 15

## 2014-05-02 MED ORDER — IOHEXOL 300 MG/ML  SOLN
100.0000 mL | Freq: Once | INTRAMUSCULAR | Status: AC | PRN
  Administered 2014-05-02: 100 mL via INTRAVENOUS

## 2014-05-02 MED ORDER — SODIUM CHLORIDE 0.9 % IV SOLN
INTRAVENOUS | Status: DC
  Administered 2014-05-02: 20:00:00 via INTRAVENOUS

## 2014-05-02 MED ORDER — SODIUM CHLORIDE 0.9 % IV SOLN
Freq: Once | INTRAVENOUS | Status: DC
  Administered 2014-05-02: 14:00:00 via INTRAVENOUS

## 2014-05-02 NOTE — Assessment & Plan Note (Signed)
Patient denies any mouth pain or oral lesions; but was noted to have multiple small lesions to the sides of his tongue and erythema throughout the mouth.

## 2014-05-02 NOTE — Assessment & Plan Note (Addendum)
Patient with increasingly severe generalized abdominal pain for the past 3-4 days.  Patient has been taking oxycodone with only minimal effectiveness.  Patient also complaining of some chronic nausea;  but only occasional vomiting.  Patient was given morphine 2 mg 2 doses while in the infusion area.

## 2014-05-02 NOTE — Assessment & Plan Note (Signed)
Albumin has decreased to 2.0 from 2.6.  Patient was encouraged to push protein in his diet is much as possible.  Patient's family states the patient does not like any for protein shakes.  I advised patient he would need to try to drink a minimum of 3 protein shakes per day despite the distaste for them.  He may alternately try a protein bar

## 2014-05-02 NOTE — Assessment & Plan Note (Signed)
Patient's family reports the patient has been slightly confused; having hallucinations and answering the phone when it is not green.  He is alternately very lethargic and spinning the last 4 days either in the better on the couch.  Patient was able to answer all questions slowly but correctly when requested to do so.  Patient denies any headache or specific vision changes recently.

## 2014-05-02 NOTE — Assessment & Plan Note (Signed)
Patient received his last cycle of Folfiri/Avastin chemotherapy regimen on 04/24/2014.  He is scheduled for cycle 3 of the same regimen on 05/08/2014.  Due to patient's overall weakness, worsening oral intake and dehydration, uncontrolled pain, and mental status changes-patient will be directly admitted to the hospital for further evaluation per hospitalist Dr. Charlies Silvers.  Brief report and history was called to Dr. Charlies Silvers prior to transfer the patient via wheelchair to room 1401 telemetry bed per [place].

## 2014-05-02 NOTE — Assessment & Plan Note (Signed)
Chemotherapy-induced pancytopenia noted today with an ANC of 0.9, hemoglobin of 9.8, and platelet count of 85.  Briefly reviewed all neutropenia guidelines with both patient and his family today.

## 2014-05-02 NOTE — Progress Notes (Signed)
will   SY[facility]NA[facility]EMENT [facility]   HPI: Frank [name] 68 y.o. male diagnosed with colon cancer.  Currently undergoing Folfiri/Avastin chemotherapy regimen.  Patient's family called the [facility] today reporting the patient has been experiencing an acute onset severe abdominal discomfort for the past 4 days.  He is continually complaining of nausea; and occasional vomiting.  He has minimal appetite and has had little oral intake.  He continues to lose weight and feels progressively weaker.  He denies any diarrhea or constipation,.  He denies any blood in the stool.  He denies any UTI symptoms.  He denies any recent fevers or chills.  Also, patients family reports the patient has been fairly confused at times for the past several days.  He is hallucinating and answer the phone when it is not ringing.  Patient's family states that this confusion is not related to pain medications; since patient has experienced this confusion even when he is not taking any pain medication.   HPI  CURRENT THERAPY: Upcoming Treatment Dates - COLORECTAL FOLFIRI q14d Days with orders from any treatment category:  05/08/2014      SCHEDULING COMMUNICATION      ondansetron (ZOFRAN) IVPB 16 mg      Dexamethasone Sodium Phosphate (DECADRON) injection 20 mg      irinotecan (CAMPTOSAR) 272 mg in dextrose 5 % 500 mL chemo infusion      leucovorin 868 mg in dextrose 5 % 250 mL infusion      fluorouracil (ADRUCIL) chemo injection 850 mg      fluorouracil (ADRUCIL) 5,200 mg in sodium chloride 0.9 % 150 mL chemo infusion      atropine injection 0.5 mg      sodium chloride 0.9 % injection 10 mL      heparin lock flush 100 unit/mL      heparin lock flush 100 unit/mL      alteplase (CATHFLO ACTIVASE) injection 2 mg      sodium chloride 0.9 % injection 3 mL      Cold Pack 1 packet      0.9 %  sodium chloride infusion      TREATMENT CONDITIONS 05/10/2014      SCHEDULING COMMUNICATION      sodium chloride 0.9 %  injection 10 mL      heparin lock flush 100 unit/mL      heparin lock flush 100 unit/mL      alteplase (CATHFLO ACTIVASE) injection 2 mg      sodium chloride 0.9 % injection 3 mL      Cold Pack 1 packet 05/22/2014      SCHEDULING COMMUNICATION      ondansetron (ZOFRAN) IVPB 16 mg      Dexamethasone Sodium Phosphate (DECADRON) injection 20 mg      irinotecan (CAMPTOSAR) 272 mg in dextrose 5 % 500 mL chemo infusion      leucovorin 868 mg in dextrose 5 % 250 mL infusion      fluorouracil (ADRUCIL) chemo injection 850 mg      fluorouracil (ADRUCIL) 5,200 mg in sodium chloride 0.9 % 150 mL chemo infusion      atropine injection 0.5 mg      sodium chloride 0.9 % injection 10 mL      heparin lock flush 100 unit/mL      heparin lock flush 100 unit/mL      alteplase (CATHFLO ACTIVASE) injection 2 mg      sodium chloride 0.9 % injection 3 mL  Cold Pack 1 packet      0.9 %  sodium chloride infusion      TREATMENT CONDITIONS    ROS  Past Medical History  Diagnosis Date  . Hyperlipidemia   . Hypertension   . Ischemic heart disease   . Obesity     Past Surgical History  Procedure Laterality Date  . Coronary artery bypass graft    . Back surgery      has Pure hypercholesterolemia; Benign hypertensive heart disease without heart failure; Premature atrial contractions; Sepsis; Colitis; Liver masses; Acute blood loss anemia; Thrombocytopenia; Leukocytosis; Malignant neoplasm of colon; Hypersensitivity reaction; Nausea & vomiting; Anorexia; Weight loss; Weakness; Hypoalbuminemia; Other pancytopenia; Abdominal pain; Dehydration; Altered mental status; and Mucositis due to antineoplastic therapy on his problem list.     is allergic to simvastatin.    Medication List       This list is accurate as of: 05/02/14  5:38 PM.  Always use your most recent med list.               atenolol 25 MG tablet  Commonly known as:  TENORMIN  take 1/2 tablet by mouth twice a day     docusate  sodium 100 MG capsule  Commonly known as:  COLACE  Take 200 mg by mouth daily.     ferrous sulfate 325 (65 FE) MG EC tablet  Take 1 tablet (325 mg total) by mouth daily with breakfast.     furosemide 40 MG tablet  Commonly known as:  LASIX  Take 1 tablet (40 mg total) by mouth daily.     lidocaine-prilocaine cream  Commonly known as:  EMLA  Apply 1 application topically as needed. On treatment or lab days, place on Port site 1-2 hours prior to appts.     LORazepam 0.5 MG tablet  Commonly known as:  ATIVAN  Take 1 tablet (0.5 mg total) by mouth every 4 (four) hours as needed (nausea).     multivitamin with minerals Tabs tablet  Take 1 tablet by mouth daily.     ondansetron 8 MG disintegrating tablet  Commonly known as:  ZOFRAN ODT  Take 1 tablet (8 mg total) by mouth every 8 (eight) hours as needed for nausea or vomiting.     oxyCODONE-acetaminophen 7.5-325 MG per tablet  Commonly known as:  PERCOCET  Take 1 tablet by mouth every 4 (four) hours as needed for pain.     potassium chloride SA 20 MEQ tablet  Commonly known as:  K-DUR,KLOR-CON  Patient to take a total of 4 tablets spaced over the next 24 hours and then 1 daily     potassium chloride SA 20 MEQ tablet  Commonly known as:  K-DUR,KLOR-CON  Take 1 tablet (20 mEq total) by mouth daily.     prochlorperazine 10 MG tablet  Commonly known as:  COMPAZINE  Take 1 tablet (10 mg total) by mouth every 6 (six) hours as needed for nausea or vomiting.     ramipril 5 MG capsule  Commonly known as:  ALTACE  Take 1 capsule (5 mg total) by mouth daily.     tadalafil 20 MG tablet  Commonly known as:  CIALIS  Take 1 tablet (20 mg total) by mouth as needed.     vitamin C 500 MG tablet  Commonly known as:  ASCORBIC ACID  Take 500 mg by mouth 2 (two) times daily.         PHYSICAL EXAMINATION  Vital: BP 146/68,  HR 114, temp 97.7  Physical Exam  Constitutional: Vital signs are normal. He appears malnourished and  dehydrated. He appears unhealthy. He appears cachectic. He has a sickly appearance.  HENT:  Head: Normocephalic and atraumatic.  Pt denies any oral pain or lesions- but was noted to have a very dry tongue; and multiple tiny lesions and erythema to tongue on exam.   Eyes: Conjunctivae and EOM are normal. Pupils are equal, round, and reactive to light. Right eye exhibits no discharge. Left eye exhibits no discharge. No scleral icterus.  Neck: Normal range of motion. Neck supple. No JVD present. No tracheal deviation present. No thyromegaly present.  Cardiovascular: Regular rhythm, normal heart sounds and intact distal pulses.   Tachycardic rate  Pulmonary/Chest: Effort normal and breath sounds normal. No stridor. No respiratory distress. He has no wheezes. He has no rales. He exhibits no tenderness.  Abdominal: Soft. Bowel sounds are normal. He exhibits no distension and no mass. There is tenderness. There is no rebound and no guarding.  Significant discomfort with any palpation or movement  Musculoskeletal: Normal range of motion. He exhibits no edema or tenderness.  Lymphadenopathy:    He has no cervical adenopathy.  Neurological:  Patient appears lethargic, and slow to answer any questions.  However, he will answer questions appropriate week when prodded.  Skin: Skin is warm and dry. No rash noted. No erythema. There is pallor.  Nursing note and vitals reviewed.   LABORATORY DATA:. Clinical Support on 05/02/2014  Component Date Value Ref Range Status  . WBC 05/02/2014 1.1* 4.0 - 10.3 10e3/uL Final  . NEUT# 05/02/2014 0.9* 1.5 - 6.5 10e3/uL Final  . HGB 05/02/2014 9.8* 13.0 - 17.1 g/dL Final  . HCT 05/02/2014 30.7* 38.4 - 49.9 % Final  . Platelets 05/02/2014 85* 140 - 400 10e3/uL Final  . MCV 05/02/2014 84.1  79.3 - 98.0 fL Final  . MCH 05/02/2014 26.8* 27.2 - 33.4 pg Final  . MCHC 05/02/2014 31.9* 32.0 - 36.0 g/dL Final  . RBC 05/02/2014 3.65* 4.20 - 5.82 10e6/uL Final  . RDW  05/02/2014 17.2* 11.0 - 14.6 % Final  . lymph# 05/02/2014 0.1* 0.9 - 3.3 10e3/uL Final  . MONO# 05/02/2014 0.2  0.1 - 0.9 10e3/uL Final  . Eosinophils Absolute 05/02/2014 0.0  0.0 - 0.5 10e3/uL Final  . Basophils Absolute 05/02/2014 0.0  0.0 - 0.1 10e3/uL Final  . NEUT% 05/02/2014 76.8* 39.0 - 75.0 % Final  . LYMPH% 05/02/2014 8.9* 14.0 - 49.0 % Final  . MONO% 05/02/2014 14.3* 0.0 - 14.0 % Final  . EOS% 05/02/2014 0.0  0.0 - 7.0 % Final  . BASO% 05/02/2014 0.0  0.0 - 2.0 % Final  . nRBC 05/02/2014 1* 0 - 0 % Final  . Sodium 05/02/2014 136  136 - 145 mEq/L Final  . Potassium 05/02/2014 4.0  3.5 - 5.1 mEq/L Final  . Chloride 05/02/2014 104  98 - 109 mEq/L Final  . CO2 05/02/2014 22  22 - 29 mEq/L Final  . Glucose 05/02/2014 145* 70 - 140 mg/dl Final  . BUN 05/02/2014 23.7  7.0 - 26.0 mg/dL Final  . Creatinine 05/02/2014 1.0  0.7 - 1.3 mg/dL Final  . Total Bilirubin 05/02/2014 0.79  0.20 - 1.20 mg/dL Final  . Alkaline Phosphatase 05/02/2014 104  40 - 150 U/L Final  . AST 05/02/2014 9  5 - 34 U/L Final  . ALT 05/02/2014 9  0 - 55 U/L Final  . Total Protein 05/02/2014 5.6*  6.4 - 8.3 g/dL Final  . Albumin 05/02/2014 2.0* 3.5 - 5.0 g/dL Final  . Calcium 05/02/2014 8.4  8.4 - 10.4 mg/dL Final  . Anion Gap 05/02/2014 10  3 - 11 mEq/L Final  . EGFR 05/02/2014 76* >90 ml/min/1.73 m2 Final   eGFR is calculated using the CKD-EPI Creatinine Equation (2009)  . Technologist Review 05/02/2014 Rare meta   Final     RADIOGRAPHIC STUDIES: No results found.  ASSESSMENT/PLAN:    Abdominal pain Patient with increasingly severe generalized abdominal pain for the past 3-4 days.  Patient has been taking oxycodone with only minimal effectiveness.  Patient also complaining of some chronic nausea;  but only occasional vomiting.  Patient was given morphine 2 mg 2 doses while in the infusion area.  Altered mental status Patient's family reports the patient has been slightly confused; having hallucinations  and answering the phone when it is not green.  He is alternately very lethargic and spinning the last 4 days either in the better on the couch.  Patient was able to answer all questions slowly but correctly when requested to do so.  Patient denies any headache or specific vision changes recently.  Anorexia Patient is complaining of minimal appetite.  Patient states he's had little oral intake whatsoever within the past 4 days.  Patient was encouraged to push fluids, protein drinks, and multiple small meals throughout the day.  Dehydration Despite metabolic panel lab results within fairly normal limits-patient does appear quite dehydrated today.  Patient received 1 L normal saline IV fluid rehydration while at the cancer Center today.  Hypoalbuminemia Albumin has decreased to 2.0 from 2.6.  Patient was encouraged to push protein in his diet is much as possible.  Patient's family states the patient does not like any for protein shakes.  I advised patient he would need to try to drink a minimum of 3 protein shakes per day despite the distaste for them.  He may alternately try a protein bar  Malignant neoplasm of colon Patient received his last cycle of Folfiri/Avastin chemotherapy regimen on 04/24/2014.  He is scheduled for cycle 3 of the same regimen on 05/08/2014.  Due to patient's overall weakness, worsening oral intake and dehydration, uncontrolled pain, and mental status changes-patient will be directly admitted to the hospital for further evaluation per hospitalist Dr. Charlies Silvers.  Brief report and history was called to Dr. Charlies Silvers prior to transfer the patient via wheelchair to room 1401 telemetry bed per [facility].  Nausea & vomiting Pt c/o chronic nausea; and occ vomiting.  Has tried both Zofran and Ativan at home with only minimal  Effectiveness; but no vomiting while the cancer Center today.    Other pancytopenia Chemotherapy-induced pancytopenia noted today with an ANC of 0.9,  hemoglobin of 9.8, and platelet count of 85.  Briefly reviewed all neutropenia guidelines with both patient and his family today.  Weakness Patient is complaining of progressive fatigue and weakness for the past 4 days.  Patient does appear very frail on exam today.  Most likely, a progressive weakness is secondary to both patient's colon cancer diagnosis as well as dehydration.  Hopefully, rehydration with IV fluids will improve patient status.  Weight loss Patient has had minimal appetite; and continues to lose weight.  Patient was encouraged to push protein is much as possible and to stay hydrated.  Once patient is discharge from the hospital-he may very well need a nutrition consult.  Mucositis due to antineoplastic therapy Patient denies any  mouth pain or oral lesions; but was noted to have multiple small lesions to the sides of his tongue and erythema throughout the mouth.   Patient stated understanding of all instructions; and was in agreement with this plan of care. The patient knows to call the clinic with any problems, questions or concerns.   Review/collaboration with Dr. [name] regarding all aspects of patient's visit today.   Total time spent with patient was 40 minutes;  with greater than 75 percent of that time spent in face to face counseling regarding his symptoms, and coordination of care and follow up.  Disclaimer: This note was dictated with voice recognition software. Similar sounding words can inadvertently be transcribed and may not be corrected upon review.   Drue Second, NP 05/02/2014

## 2014-05-02 NOTE — Patient Instructions (Signed)
Dehydration, Adult Dehydration is when you lose more fluids from the body than you take in. Vital organs like the kidneys, brain, and heart cannot function without a proper amount of fluids and salt. Any loss of fluids from the body can cause dehydration.  CAUSES   Vomiting.  Diarrhea.  Excessive sweating.  Excessive urine output.  Fever. SYMPTOMS  Mild dehydration  Thirst.  Dry lips.  Slightly dry mouth. Moderate dehydration  Very dry mouth.  Sunken eyes.  Skin does not bounce back quickly when lightly pinched and released.  Dark urine and decreased urine production.  Decreased tear production.  Headache. Severe dehydration  Very dry mouth.  Extreme thirst.  Rapid, weak pulse (more than 100 beats per minute at rest).  Cold hands and feet.  Not able to sweat in spite of heat and temperature.  Rapid breathing.  Blue lips.  Confusion and lethargy.  Difficulty being awakened.  Minimal urine production.  No tears. DIAGNOSIS  Your caregiver will diagnose dehydration based on your symptoms and your exam. Blood and urine tests will help confirm the diagnosis. The diagnostic evaluation should also identify the cause of dehydration. TREATMENT  Treatment of mild or moderate dehydration can often be done at home by increasing the amount of fluids that you drink. It is best to drink small amounts of fluid more often. Drinking too much at one time can make vomiting worse. Refer to the home care instructions below. Severe dehydration needs to be treated at the hospital where you will probably be given intravenous (IV) fluids that contain water and electrolytes. HOME CARE INSTRUCTIONS   Ask your caregiver about specific rehydration instructions.  Drink enough fluids to keep your urine clear or pale yellow.  Drink small amounts frequently if you have nausea and vomiting.  Eat as you normally do.  Avoid:  Foods or drinks high in sugar.  Carbonated  drinks.  Juice.  Extremely hot or cold fluids.  Drinks with caffeine.  Fatty, greasy foods.  Alcohol.  Tobacco.  Overeating.  Gelatin desserts.  Wash your hands well to avoid spreading bacteria and viruses.  Only take over-the-counter or prescription medicines for pain, discomfort, or fever as directed by your caregiver.  Ask your caregiver if you should continue all prescribed and over-the-counter medicines.  Keep all follow-up appointments with your caregiver. SEEK MEDICAL CARE IF:  You have abdominal pain and it increases or stays in one area (localizes).  You have a rash, stiff neck, or severe headache.  You are irritable, sleepy, or difficult to awaken.  You are weak, dizzy, or extremely thirsty. SEEK IMMEDIATE MEDICAL CARE IF:   You are unable to keep fluids down or you get worse despite treatment.  You have frequent episodes of vomiting or diarrhea.  You have blood or green matter (bile) in your vomit.  You have blood in your stool or your stool looks black and tarry.  You have not urinated in 6 to 8 hours, or you have only urinated a small amount of very dark urine.  You have a fever.  You faint. MAKE SURE YOU:   Understand these instructions.  Will watch your condition.  Will get help right away if you are not doing well or get worse. Document Released: 04/27/2005 Document Revised: 07/20/2011 Document Reviewed: 12/15/2010 Exit[employer] Patient Information 2015 [employer]. This information is not intended to replace advice given to you by your health care provider. Make sure you discuss any questions you have with your health care   provider.  

## 2014-05-02 NOTE — Progress Notes (Signed)
Called report to [facility] on [address].  Patient transported by wheelchair to the floor.

## 2014-05-02 NOTE — Assessment & Plan Note (Addendum)
Pt c/o chronic nausea; and occ vomiting.  Has tried both Zofran and Ativan at home with only minimal  Effectiveness; but no vomiting while the cancer Center today.

## 2014-05-02 NOTE — Assessment & Plan Note (Signed)
Patient is complaining of progressive fatigue and weakness for the past 4 days.  Patient does appear very frail on exam today.  Most likely, a progressive weakness is secondary to both patient's colon cancer diagnosis as well as dehydration.  Hopefully, rehydration with IV fluids will improve patient status.

## 2014-05-02 NOTE — Assessment & Plan Note (Signed)
Despite metabolic panel lab results within fairly normal limits-patient does appear quite dehydrated today.  Patient received 1 L normal saline IV fluid rehydration while at the cancer Center today.

## 2014-05-02 NOTE — Assessment & Plan Note (Signed)
Patient is complaining of minimal appetite.  Patient states he's had little oral intake whatsoever within the past 4 days.  Patient was encouraged to push fluids, protein drinks, and multiple small meals throughout the day.

## 2014-05-02 NOTE — H&P (Addendum)
[facility] History and Physical  Patient: Frank [name]  UUV:[id number]  DOB: Mar 24, 1946  DOS: the patient was seen and examined on 05/02/2014 PCP: [name], MD  Chief Complaint: Abdominal pain  HPI: Frank [name] is a 68 y.o. male with Past medical history of colon cancer, hypertension, coronary artery disease status post CABG, diastolic dysfunction with preserved ejection fraction. Patient presented with complaints of abdominal pain. History was obtained from patient's significant other. On 04/24/2014 patient had his chemotherapy with FOLFIRI and Avastin, he is also on IV iron. Generally after chemotherapy he has nausea and vomiting lasting for few days as well as diarrhea without any blood. After his recent chemotherapy on Thursday he started having lower abdominal pain which as per the wife was initially located on the right side and then becoming generalized on the lower abdomen. The pain was described as a burning pain. And the patient has been taking Percocet at home as needed but despite that his pain has not been comfortable. Patient also has constipation and has had his last bowel movement on 05/01/2014 without any blood. After the onset of abdominal pain on Thursday patient started having episodes of nausea and vomiting without any blood with 2-3 episodes of vomiting on a daily basis and persistent nausea. Due to each starting Monday evening the patient was given lorazepam for nausea as needed. Patient was seen today for the follow-up in the clinic and was found to have generalized weakness associated with absolute neutropenia and was referred here for further workup. At the time of my evaluation the family denies any complaint of fever, chills, chest pain, shortness of breath, cough, burning urination, active bleeding anywhere, any rash anywhere, leg swelling, orthopnea, PND. Due to persistent nausea patient has not been able to take his medication since  yesterday. past per patient's wife he has also been lethargic and confused since last few days, and this has began even before starting the patient on lorazepam. There is no fall no trauma reported.  The patient is coming from home. And at his baseline independent for most of his ADL.  Review of Systems: as mentioned in the history of present illness.  A Comprehensive review of the other systems is negative.  Past Medical History  Diagnosis Date  . Hyperlipidemia   . Hypertension   . Ischemic heart disease   . Obesity    Past Surgical History  Procedure Laterality Date  . Coronary artery bypass graft    . Back surgery     Social History:  reports that he has quit smoking. He does not have any smokeless tobacco history on file. He reports that he drinks about 0.6 oz of alcohol per week. He reports that he does not use illicit drugs.  Allergies  Allergen Reactions  . Simvastatin Other (See Comments)    "Muscle aches"    Family History  Problem Relation Age of Onset  . Cancer Father 26  . Peripheral vascular disease Mother 48    deceased    Prior to Admission medications   Medication Sig Start Date End Date Taking? Authorizing Provider  atenolol (TENORMIN) 25 MG tablet take 1/2 tablet by mouth twice a day 04/26/14  Yes [name], MD  docusate sodium (COLACE) 100 MG capsule Take 200 mg by mouth daily as needed.    Yes Historical Provider, MD  lidocaine-prilocaine (EMLA) cream Apply 1 application topically as needed. On treatment or lab days, place on Port site 1-2 hours prior to appts.  03/22/14  Yes Wyatt Portela, MD  LORazepam (ATIVAN) 0.5 MG tablet Take 1 tablet (0.5 mg total) by mouth every 4 (four) hours as needed (nausea). 04/30/14  Yes Drue Second, NP  Multiple Vitamin (MULTIVITAMIN WITH MINERALS) TABS tablet Take 1 tablet by mouth daily.   Yes Historical Provider, MD  OVER THE COUNTER MEDICATION Take 1 application by mouth 2 (two) times daily as needed (pain).  Essential Oils   Yes Historical Provider, MD  oxyCODONE-acetaminophen (PERCOCET) 7.5-325 MG per tablet Take 1 tablet by mouth every 4 (four) hours as needed for pain. 03/13/14  Yes Wyatt Portela, MD  polyvinyl alcohol (LIQUIFILM TEARS) 1.4 % ophthalmic solution Place 1-2 drops into both eyes daily as needed for dry eyes.   Yes Historical Provider, MD  potassium chloride SA (K-DUR,KLOR-CON) 20 MEQ tablet Take 1 tablet (20 mEq total) by mouth daily. 04/10/14  Yes Wyatt Portela, MD  vitamin C (ASCORBIC ACID) 500 MG tablet Take 500 mg by mouth 2 (two) times daily.   Yes Historical Provider, MD  furosemide (LASIX) 40 MG tablet Take 1 tablet (40 mg total) by mouth daily. Patient not taking: Reported on 05/02/2014 03/14/14   [name], MD  ondansetron (ZOFRAN ODT) 8 MG disintegrating tablet Take 1 tablet (8 mg total) by mouth every 8 (eight) hours as needed for nausea or vomiting. 04/10/14   Wyatt Portela, MD  potassium chloride SA (K-DUR,KLOR-CON) 20 MEQ tablet Patient to take a total of 4 tablets spaced over the next 24 hours and then 1 daily Patient not taking: Reported on 05/02/2014 03/09/14   Carlena Bjornstad, MD  prochlorperazine (COMPAZINE) 10 MG tablet Take 1 tablet (10 mg total) by mouth every 6 (six) hours as needed for nausea or vomiting. Patient not taking: Reported on 05/02/2014 03/27/14   Carlton Adam, PA-C  tadalafil (CIALIS) 20 MG tablet Take 1 tablet (20 mg total) by mouth as needed. Patient not taking: Reported on 05/02/2014 02/15/13   [name], MD    Physical Exam: Filed Vitals:   05/02/14 1744 05/02/14 2035  BP: 127/72 118/73  Pulse: 110 116  Temp: 97.9 F (36.6 C) 98.1 F (36.7 C)  TempSrc: Oral Oral  Resp: 18 18  Height: 5\' 8"  (1.727 m)   Weight: 78.642 kg (173 lb 6 oz)   SpO2: 98% 97%    General: Alert, Awake and Oriented to Time, Place and Person. Appear in mild distress Eyes: PERRL ENT: Oral Mucosa suggest mucositis and dry mouth. Neck: no  JVD Cardiovascular: S1 and S2 Presaortic systolic Murmur, Peripheral Pulses Present Respiratory: Bilateral Air entry equal and Decreased, Clear to Auscultation, noCrackles, no wheezes Abdomen: Bowel Sounpresent and sluggish, Soft and and diffusely tender Skin: no Rash Extremities: no Pedal edema, no calf tenderness Neurologic: Grossly no focal neuro deficit.  Labs on Admission:  CBC:  Recent Labs Lab 05/02/14 1510  WBC 1.1*  NEUTROABS 0.9*  HGB 9.8*  HCT 30.7*  MCV 84.1  PLT 85*    CMP     Component Value Date/Time   NA 136 05/02/2014 1511   NA 137 03/19/2014 1229   K 4.0 05/02/2014 1511   K 4.0 03/19/2014 1229   CL 98 03/19/2014 1229   CO2 22 05/02/2014 1511   CO2 24 03/19/2014 1229   GLUCOSE 145* 05/02/2014 1511   GLUCOSE 92 03/19/2014 1229   BUN 23.7 05/02/2014 1511   BUN 15 03/19/2014 1229   CREATININE 1.0 05/02/2014 1511   CREATININE  0.98 03/19/2014 1229   CALCIUM 8.4 05/02/2014 1511   CALCIUM 8.9 03/19/2014 1229   PROT 5.6* 05/02/2014 1511   PROT 6.4 03/09/2014 0851   ALBUMIN 2.0* 05/02/2014 1511   ALBUMIN 2.2* 03/09/2014 0851   AST 9 05/02/2014 1511   AST 26 03/09/2014 0851   ALT 9 05/02/2014 1511   ALT 22 03/09/2014 0851   ALKPHOS 104 05/02/2014 1511   ALKPHOS 351* 03/09/2014 0851   BILITOT 0.79 05/02/2014 1511   BILITOT 1.8* 03/09/2014 0851   GFRNONAA 81* 03/19/2014 1229   GFRAA >90 03/19/2014 1229    No results for input(s): LIPASE, AMYLASE in the last 168 hours. No results for input(s): AMMONIA in the last 168 hours.  No results for input(s): CKTOTAL, CKMB, CKMBINDEX, TROPONINI in the last 168 hours. BNP (last 3 results) No results for input(s): PROBNP in the last 8760 hours.  Radiological Exams on Admission: Ct Head Wo Contrast  05/02/2014   CLINICAL DATA:  Increased confusion today with nausea and vomiting  EXAM: CT HEAD WITHOUT CONTRAST  TECHNIQUE: Contiguous axial images were obtained from the base of the skull through the vertex  without intravenous contrast.  COMPARISON:  None  FINDINGS: Advanced atrophy with prominence of the bifrontal extra-axial spaces. Gray-white differentiation is maintained. No CT evidence of acute large territory infarct. No intraparenchymal extra-axial mass or hemorrhage. Unchanged size and configuration of the ventricles and basilar cisterns. No midline shift.  Limited visualization of the paranasal sinuses and mastoid air cells are normal. No air-fluid level. There is potential enlargement of the sella (image 9, series 3).  Regional soft tissues appear normal. Paranasal sinuses and mastoid air cells are normal. No displaced calvarial fracture.  IMPRESSION: 1. Advanced atrophy without acute intracranial process. 2. Nonspecific potential enlargement of the sella. Further evaluation could be performed with nonemergent brain MRI as clinically indicated.   Electronically Signed   By: [name] M.D.   On: 05/02/2014 21:51    Assessment/Plan Principal Problem:   Intractable nausea and vomiting Active Problems:   Liver masses   Malignant neoplasm of colon   Abdominal pain   Dehydration   Mucositis due to antineoplastic therapy   Acute encephalopathy   1. Intractable nausea and vomiting the patient is presenting with complaints of nausea vomiting associated with abdominal pain. The pain patient has has been significant enough for him to require assistance at the clinic today. In his prior chemotherapy he has never developed any pain and has routinely has diarrhea. Currently patient has generalized tenderness but soft abdomen and sluggish bowel sound. His lab work does not show any significant abnormality on CMP. I will check amylase, lipase, ammonia level, TSH, lactic acid level urinalysis. I would also check CT of his abdomen with contrast. Patient will remain nothing by mouth except medications overnight. I would hold his antihypertensive medications as well as diuretic and hydrate him  aggressively  2. Acute encephalopathy Check CT of the head as well as ammonia level and TSH level as well as urinalysis Reducing patient's lorazepam from every 4 hours to twice a day when necessary  3. Pancytopenia Neutropenia Likely secondary to chemotherapy. Patient does not appear to have any fever at present he did Closed monitoring and neutropenic precautions.  4. Chronic diastolic heart failure. Currently holding patient's Lasix. Closely monitor ins and outs as well as daily weight.  5. Sinus tachycardia. Likely secondary to dehydration as well as a pain. Checking EKG.  Advance goals of care discussion: DNR/DNI as  per my discussions with patient's wife based on his living will.  Consults:  medical oncology   DVT Prophylaxis: mechanical compression device Nutrition:Nothing by mouth except medication   Family Communication:  Significant other was present at bedside, opportunity was given to ask question and all questions were answered satisfactorily at the time of interview. Disposition: Admitted to inpatient in telemetry unit.  Author: Berle Mull, MD [facility] Pager: [phone] 05/02/2014, 10:45 PM    Addendum: 1) peritonitis Perforated bowel Intra-abdominal abscess Entritis  Patient presented with complaints of abdominal pain. CT of the abdomen shows above mentioned findings. Case was discussed with Dr. Hassell Done from general surgery who will be following up with the patient. Due to ongoing chemotherapy and neutropenia the patient's antibiotics have been broadened to cefepime and Flagyl. Patient will remain nothing by mouth. Family updated about the plan. We will switch patient's medications to IV only.  Jazmyn Offner 12:28 AM 05/03/2014    If 7PM-7AM, please contact night-coverage [email or web address] Password [email or web address]

## 2014-05-02 NOTE — Assessment & Plan Note (Signed)
Patient has had minimal appetite; and continues to lose weight.  Patient was encouraged to push protein is much as possible and to stay hydrated.  Once patient is discharge from the hospital-he may very well need a nutrition consult.

## 2014-05-03 ENCOUNTER — Encounter (HOSPITAL_COMMUNITY)
Admission: AD | Disposition: A | Discharge status: Hospice | Payer: Self-pay | Source: Ambulatory Visit | Attending: Family Medicine

## 2014-05-03 ENCOUNTER — Encounter (HOSPITAL_COMMUNITY): Payer: Self-pay | Admitting: Anesthesiology

## 2014-05-03 ENCOUNTER — Inpatient Hospital Stay (HOSPITAL_COMMUNITY): Payer: Medicare Other | Admitting: Anesthesiology

## 2014-05-03 ENCOUNTER — Inpatient Hospital Stay (HOSPITAL_COMMUNITY): Payer: Medicare Other

## 2014-05-03 ENCOUNTER — Ambulatory Visit: Payer: Medicare Other

## 2014-05-03 DIAGNOSIS — I10 Essential (primary) hypertension: Secondary | ICD-10-CM

## 2014-05-03 DIAGNOSIS — T884XXA Failed or difficult intubation, initial encounter: Secondary | ICD-10-CM

## 2014-05-03 DIAGNOSIS — Z515 Encounter for palliative care: Secondary | ICD-10-CM | POA: Insufficient documentation

## 2014-05-03 DIAGNOSIS — K631 Perforation of intestine (nontraumatic): Secondary | ICD-10-CM | POA: Diagnosis present

## 2014-05-03 HISTORY — PX: COLON RESECTION: SHX5231

## 2014-05-03 HISTORY — DX: Failed or difficult intubation, initial encounter: T88.4XXA

## 2014-05-03 LAB — COMPREHENSIVE METABOLIC PANEL
ALBUMIN: 2.2 g/dL — AB (ref 3.5–5.2)
ALK PHOS: 139 U/L — AB (ref 39–117)
ALT: 9 U/L (ref 0–53)
ANION GAP: 6 (ref 5–15)
AST: 11 U/L (ref 0–37)
BUN: 25 mg/dL — AB (ref 6–23)
CO2: 23 mmol/L (ref 19–32)
CREATININE: 1.04 mg/dL (ref 0.50–1.35)
Calcium: 8 mg/dL — ABNORMAL LOW (ref 8.4–10.5)
Chloride: 106 mEq/L (ref 96–112)
GFR calc Af Amer: 82 mL/min — ABNORMAL LOW (ref >90)
GFR calc non Af Amer: 71 mL/min — ABNORMAL LOW (ref >90)
Glucose, Bld: 117 mg/dL — ABNORMAL HIGH (ref 70–99)
Potassium: 3.8 mmol/L (ref 3.5–5.1)
Sodium: 135 mmol/L (ref 135–145)
Total Bilirubin: 1 mg/dL (ref 0.3–1.2)
Total Protein: 5.5 g/dL — ABNORMAL LOW (ref 6.0–8.3)

## 2014-05-03 LAB — BASIC METABOLIC PANEL
Anion gap: 6 (ref 5–15)
BUN: 29 mg/dL — ABNORMAL HIGH (ref 6–23)
CALCIUM: 7.8 mg/dL — AB (ref 8.4–10.5)
CHLORIDE: 110 meq/L (ref 96–112)
CO2: 23 mmol/L (ref 19–32)
Creatinine, Ser: 1.02 mg/dL (ref 0.50–1.35)
GFR calc Af Amer: 84 mL/min — ABNORMAL LOW (ref >90)
GFR calc non Af Amer: 72 mL/min — ABNORMAL LOW (ref >90)
GLUCOSE: 155 mg/dL — AB (ref 70–99)
Potassium: 4.3 mmol/L (ref 3.5–5.1)
SODIUM: 139 mmol/L (ref 135–145)

## 2014-05-03 LAB — CBC WITH DIFFERENTIAL/PLATELET
BASOS ABS: 0 10*3/uL (ref 0.0–0.1)
BASOS PCT: 0 % (ref 0–1)
Basophils Absolute: 0 10*3/uL (ref 0.0–0.1)
Basophils Relative: 0 % (ref 0–1)
EOS ABS: 0 10*3/uL (ref 0.0–0.7)
EOS PCT: 0 % (ref 0–5)
Eosinophils Absolute: 0 10*3/uL (ref 0.0–0.7)
Eosinophils Relative: 0 % (ref 0–5)
HCT: 32.1 % — ABNORMAL LOW (ref 39.0–52.0)
HEMATOCRIT: 30 % — AB (ref 39.0–52.0)
HEMOGLOBIN: 10.3 g/dL — AB (ref 13.0–17.0)
Hemoglobin: 9.5 g/dL — ABNORMAL LOW (ref 13.0–17.0)
LYMPHS PCT: 13 % (ref 12–46)
LYMPHS PCT: 8 % — AB (ref 12–46)
Lymphs Abs: 0.1 10*3/uL — ABNORMAL LOW (ref 0.7–4.0)
Lymphs Abs: 0.2 10*3/uL — ABNORMAL LOW (ref 0.7–4.0)
MCH: 27.1 pg (ref 26.0–34.0)
MCH: 27.8 pg (ref 26.0–34.0)
MCHC: 31.7 g/dL (ref 30.0–36.0)
MCHC: 32.1 g/dL (ref 30.0–36.0)
MCV: 85.7 fL (ref 78.0–100.0)
MCV: 86.5 fL (ref 78.0–100.0)
MONOS PCT: 21 % — AB (ref 3–12)
Monocytes Absolute: 0.2 10*3/uL (ref 0.1–1.0)
Monocytes Absolute: 0.5 10*3/uL (ref 0.1–1.0)
Monocytes Relative: 15 % — ABNORMAL HIGH (ref 3–12)
NEUTROS ABS: 0.8 10*3/uL — AB (ref 1.7–7.7)
NEUTROS PCT: 71 % (ref 43–77)
NEUTROS PCT: 72 % (ref 43–77)
Neutro Abs: 1.6 10*3/uL — ABNORMAL LOW (ref 1.7–7.7)
PLATELETS: 133 10*3/uL — AB (ref 150–400)
PLATELETS: 84 10*3/uL — AB (ref 150–400)
RBC: 3.5 MIL/uL — ABNORMAL LOW (ref 4.22–5.81)
RBC: 3.71 MIL/uL — ABNORMAL LOW (ref 4.22–5.81)
RDW: 15.7 % — ABNORMAL HIGH (ref 11.5–15.5)
RDW: 17.2 % — AB (ref 11.5–15.5)
WBC: 1.1 10*3/uL — CL (ref 4.0–10.5)
WBC: 2.3 10*3/uL — AB (ref 4.0–10.5)

## 2014-05-03 LAB — URINALYSIS, ROUTINE W REFLEX MICROSCOPIC
GLUCOSE, UA: NEGATIVE mg/dL
HGB URINE DIPSTICK: NEGATIVE
KETONES UR: NEGATIVE mg/dL
Leukocytes, UA: NEGATIVE
Nitrite: NEGATIVE
Protein, ur: 100 mg/dL — AB
Specific Gravity, Urine: 1.038 — ABNORMAL HIGH (ref 1.005–1.030)
Urobilinogen, UA: 1 mg/dL (ref 0.0–1.0)
pH: 5.5 (ref 5.0–8.0)

## 2014-05-03 LAB — GLUCOSE, CAPILLARY
Glucose-Capillary: 103 mg/dL — ABNORMAL HIGH (ref 70–99)
Glucose-Capillary: 147 mg/dL — ABNORMAL HIGH (ref 70–99)

## 2014-05-03 LAB — MRSA PCR SCREENING: MRSA by PCR: NEGATIVE

## 2014-05-03 LAB — URINE MICROSCOPIC-ADD ON

## 2014-05-03 LAB — PREPARE RBC (CROSSMATCH)

## 2014-05-03 LAB — PROTIME-INR
INR: 1.62 — ABNORMAL HIGH (ref 0.00–1.49)
PROTHROMBIN TIME: 19.4 s — AB (ref 11.6–15.2)

## 2014-05-03 LAB — TSH: TSH: 1.186 u[IU]/mL (ref 0.350–4.500)

## 2014-05-03 LAB — LIPASE, BLOOD

## 2014-05-03 LAB — AMMONIA: AMMONIA: 17 umol/L (ref 11–32)

## 2014-05-03 LAB — LACTIC ACID, PLASMA: LACTIC ACID, VENOUS: 1.4 mmol/L (ref 0.5–2.2)

## 2014-05-03 SURGERY — COLON RESECTION
Anesthesia: General

## 2014-05-03 MED ORDER — LACTATED RINGERS IV SOLN
INTRAVENOUS | Status: DC | PRN
  Administered 2014-05-03: 15:00:00 via INTRAVENOUS

## 2014-05-03 MED ORDER — SODIUM CHLORIDE 0.9 % IV SOLN: Freq: Once | INTRAVENOUS | Status: DC

## 2014-05-03 MED ORDER — LABETALOL HCL 5 MG/ML IV SOLN
INTRAVENOUS | Status: DC | PRN
  Administered 2014-05-03 (×2): 5 mg via INTRAVENOUS

## 2014-05-03 MED ORDER — LACTATED RINGERS IV SOLN
INTRAVENOUS | Status: DC | PRN
  Administered 2014-05-03 (×2): via INTRAVENOUS

## 2014-05-03 MED ORDER — METOPROLOL TARTRATE 1 MG/ML IV SOLN
2.5000 mg | Freq: Three times a day (TID) | INTRAVENOUS | Status: DC
  Administered 2014-05-03 – 2014-05-04 (×2): 2.5 mg via INTRAVENOUS
  Filled 2014-05-03 (×2): qty 5

## 2014-05-03 MED ORDER — NEOSTIGMINE METHYLSULFATE 10 MG/10ML IV SOLN
INTRAVENOUS | Status: AC
  Filled 2014-05-03: qty 1

## 2014-05-03 MED ORDER — ONDANSETRON HCL 4 MG/2ML IJ SOLN
INTRAMUSCULAR | Status: DC | PRN
  Administered 2014-05-03: 4 mg via INTRAVENOUS

## 2014-05-03 MED ORDER — PROPOFOL 10 MG/ML IV BOLUS
INTRAVENOUS | Status: AC
  Filled 2014-05-03: qty 20

## 2014-05-03 MED ORDER — METRONIDAZOLE IN NACL 5-0.79 MG/ML-% IV SOLN
500.0000 mg | Freq: Three times a day (TID) | INTRAVENOUS | Status: DC
  Filled 2014-05-03: qty 100

## 2014-05-03 MED ORDER — VITAMINS A & D EX OINT
TOPICAL_OINTMENT | CUTANEOUS | Status: AC
  Administered 2014-05-03: 01:00:00
  Filled 2014-05-03: qty 5

## 2014-05-03 MED ORDER — FENTANYL CITRATE 0.05 MG/ML IJ SOLN: 25.0000 ug | INTRAMUSCULAR | Status: DC | PRN

## 2014-05-03 MED ORDER — SUFENTANIL CITRATE 50 MCG/ML IV SOLN
INTRAVENOUS | Status: DC | PRN
  Administered 2014-05-03 (×2): 10 ug via INTRAVENOUS
  Administered 2014-05-03: 25 ug via INTRAVENOUS
  Administered 2014-05-03 (×2): 15 ug via INTRAVENOUS
  Administered 2014-05-03: 25 ug via INTRAVENOUS
  Administered 2014-05-03 (×2): 5 ug via INTRAVENOUS

## 2014-05-03 MED ORDER — KCL IN DEXTROSE-NACL 20-5-0.45 MEQ/L-%-% IV SOLN
INTRAVENOUS | Status: DC
  Administered 2014-05-03 – 2014-05-04 (×4): via INTRAVENOUS
  Administered 2014-05-04: 1000 mL via INTRAVENOUS
  Administered 2014-05-04 – 2014-05-05 (×2): via INTRAVENOUS
  Administered 2014-05-05: 1000 mL via INTRAVENOUS
  Administered 2014-05-05: 11:00:00 via INTRAVENOUS
  Administered 2014-05-05: 1000 mL via INTRAVENOUS
  Administered 2014-05-05: 22:00:00 via INTRAVENOUS
  Administered 2014-05-06: 250 mL/h via INTRAVENOUS
  Administered 2014-05-06 (×2): via INTRAVENOUS
  Filled 2014-05-03 (×6): qty 1000
  Filled 2014-05-03: qty 2000
  Filled 2014-05-03 (×2): qty 1000
  Filled 2014-05-03: qty 2000
  Filled 2014-05-03: qty 1000
  Filled 2014-05-03: qty 2000
  Filled 2014-05-03 (×2): qty 1000

## 2014-05-03 MED ORDER — CISATRACURIUM BESYLATE 20 MG/10ML IV SOLN
INTRAVENOUS | Status: AC
  Filled 2014-05-03: qty 10

## 2014-05-03 MED ORDER — CETYLPYRIDINIUM CHLORIDE 0.05 % MT LIQD
7.0000 mL | Freq: Two times a day (BID) | OROMUCOSAL | Status: DC
  Administered 2014-05-03 – 2014-05-21 (×33): 7 mL via OROMUCOSAL

## 2014-05-03 MED ORDER — SODIUM CHLORIDE 0.9 % IV SOLN
INTRAVENOUS | Status: DC | PRN
  Administered 2014-05-03 (×2): via INTRAVENOUS

## 2014-05-03 MED ORDER — PANTOPRAZOLE SODIUM 40 MG IV SOLR
40.0000 mg | INTRAVENOUS | Status: DC
  Administered 2014-05-03 – 2014-05-10 (×8): 40 mg via INTRAVENOUS
  Filled 2014-05-03 (×9): qty 40

## 2014-05-03 MED ORDER — DEXTROSE 5 % IV SOLN
2.0000 g | Freq: Three times a day (TID) | INTRAVENOUS | Status: DC
  Administered 2014-05-03 – 2014-05-10 (×23): 2 g via INTRAVENOUS
  Filled 2014-05-03 (×24): qty 2

## 2014-05-03 MED ORDER — LACTATED RINGERS IV SOLN
INTRAVENOUS | Status: DC | PRN
  Administered 2014-05-03 (×2): via INTRAVENOUS

## 2014-05-03 MED ORDER — SUFENTANIL CITRATE 50 MCG/ML IV SOLN
INTRAVENOUS | Status: AC
  Filled 2014-05-03: qty 1

## 2014-05-03 MED ORDER — DIPHENHYDRAMINE HCL 50 MG/ML IJ SOLN: 12.5000 mg | Freq: Four times a day (QID) | INTRAMUSCULAR | Status: DC | PRN

## 2014-05-03 MED ORDER — MORPHINE SULFATE (PF) 1 MG/ML IV SOLN: INTRAVENOUS | Status: DC

## 2014-05-03 MED ORDER — PROPOFOL 10 MG/ML IV BOLUS
INTRAVENOUS | Status: DC | PRN
  Administered 2014-05-03: 30 mg via INTRAVENOUS
  Administered 2014-05-03: 120 mg via INTRAVENOUS

## 2014-05-03 MED ORDER — PROMETHAZINE HCL 25 MG/ML IJ SOLN: 6.2500 mg | INTRAMUSCULAR | Status: DC | PRN

## 2014-05-03 MED ORDER — MEPERIDINE HCL 50 MG/ML IJ SOLN: 6.2500 mg | INTRAMUSCULAR | Status: DC | PRN

## 2014-05-03 MED ORDER — ONDANSETRON HCL 4 MG/2ML IJ SOLN: 4.0000 mg | Freq: Four times a day (QID) | INTRAMUSCULAR | Status: DC | PRN

## 2014-05-03 MED ORDER — SUCCINYLCHOLINE CHLORIDE 20 MG/ML IJ SOLN
INTRAMUSCULAR | Status: DC | PRN
  Administered 2014-05-03: 100 mg via INTRAVENOUS

## 2014-05-03 MED ORDER — DIPHENHYDRAMINE HCL 12.5 MG/5ML PO ELIX: 12.5000 mg | ORAL_SOLUTION | Freq: Four times a day (QID) | ORAL | Status: DC | PRN

## 2014-05-03 MED ORDER — DEXAMETHASONE SODIUM PHOSPHATE 10 MG/ML IJ SOLN
INTRAMUSCULAR | Status: DC | PRN
  Administered 2014-05-03: 10 mg via INTRAVENOUS

## 2014-05-03 MED ORDER — GLYCOPYRROLATE 0.2 MG/ML IJ SOLN
INTRAMUSCULAR | Status: DC | PRN
Start: 2014-05-03 — End: 2014-05-03
  Administered 2014-05-03: 0.4 mg via INTRAVENOUS

## 2014-05-03 MED ORDER — SODIUM CHLORIDE 0.9 % IV SOLN
INTRAVENOUS | Status: DC
Start: 2014-05-03 — End: 2014-05-03
  Administered 2014-05-03: 150 mL/h via INTRAVENOUS

## 2014-05-03 MED ORDER — CISATRACURIUM BESYLATE (PF) 10 MG/5ML IV SOLN
INTRAVENOUS | Status: DC | PRN
  Administered 2014-05-03: 6 mg via INTRAVENOUS
  Administered 2014-05-03: 4 mg via INTRAVENOUS
  Administered 2014-05-03 (×2): 10 mg via INTRAVENOUS

## 2014-05-03 MED ORDER — NALOXONE HCL 0.4 MG/ML IJ SOLN: 0.4000 mg | INTRAMUSCULAR | Status: DC | PRN

## 2014-05-03 MED ORDER — SODIUM CHLORIDE 0.9 % IJ SOLN: 9.0000 mL | INTRAMUSCULAR | Status: DC | PRN

## 2014-05-03 MED ORDER — METRONIDAZOLE IN NACL 5-0.79 MG/ML-% IV SOLN
500.0000 mg | Freq: Three times a day (TID) | INTRAVENOUS | Status: DC
  Administered 2014-05-03: 500 mg via INTRAVENOUS
  Administered 2014-05-03: .5 g via INTRAVENOUS
  Administered 2014-05-03 – 2014-05-10 (×22): 500 mg via INTRAVENOUS
  Filled 2014-05-03 (×25): qty 100

## 2014-05-03 MED ORDER — GLYCOPYRROLATE 0.2 MG/ML IJ SOLN
INTRAMUSCULAR | Status: AC
  Filled 2014-05-03: qty 2

## 2014-05-03 MED ORDER — HYDROMORPHONE HCL 2 MG/ML IJ SOLN
INTRAMUSCULAR | Status: AC
  Filled 2014-05-03: qty 1

## 2014-05-03 MED ORDER — SODIUM CHLORIDE 0.9 % IV SOLN
10.0000 mL/h | Freq: Once | INTRAVENOUS | Status: AC
  Administered 2014-05-14: 10 mL/h via INTRAVENOUS

## 2014-05-03 MED ORDER — HYDROMORPHONE HCL 1 MG/ML IJ SOLN
INTRAMUSCULAR | Status: DC | PRN
  Administered 2014-05-03 (×4): 1 mg via INTRAVENOUS

## 2014-05-03 MED ORDER — NEOSTIGMINE METHYLSULFATE 10 MG/10ML IV SOLN
INTRAVENOUS | Status: DC | PRN
  Administered 2014-05-03: 3 mg via INTRAVENOUS

## 2014-05-03 MED ORDER — 0.9 % SODIUM CHLORIDE (POUR BTL) OPTIME
TOPICAL | Status: DC | PRN
  Administered 2014-05-03: 10000 mL

## 2014-05-03 MED ORDER — MORPHINE SULFATE 10 MG/ML IJ SOLN: 1.0000 mg | INTRAMUSCULAR | Status: DC | PRN

## 2014-05-03 MED ORDER — LIDOCAINE HCL (CARDIAC) 20 MG/ML IV SOLN
INTRAVENOUS | Status: AC
  Filled 2014-05-03: qty 5

## 2014-05-03 MED ORDER — CIPROFLOXACIN IN D5W 400 MG/200ML IV SOLN
400.0000 mg | Freq: Two times a day (BID) | INTRAVENOUS | Status: DC
  Filled 2014-05-03: qty 200

## 2014-05-03 MED ORDER — LIDOCAINE HCL (CARDIAC) 20 MG/ML IV SOLN
INTRAVENOUS | Status: DC | PRN
  Administered 2014-05-03: 100 mg via INTRAVENOUS

## 2014-05-03 SURGICAL SUPPLY — 56 items
APPLICATOR COTTON TIP 6IN STRL (MISCELLANEOUS) IMPLANT
BLADE EXTENDED COATED 6.5IN (ELECTRODE) ×3 IMPLANT
BLADE HEX COATED 2.75 (ELECTRODE) ×3 IMPLANT
BNDG GAUZE ELAST 4 BULKY (GAUZE/BANDAGES/DRESSINGS) ×2 IMPLANT
CLAMP POUCH DRAINAGE QUIET (OSTOMY) ×2 IMPLANT
CLIP TI LARGE 6 (CLIP) IMPLANT
DRAPE LAPAROSCOPIC ABDOMINAL (DRAPES) ×3 IMPLANT
ELECT REM PT RETURN 9FT ADLT (ELECTROSURGICAL) ×3
ELECTRODE REM PT RTRN 9FT ADLT (ELECTROSURGICAL) ×1 IMPLANT
GAUZE SPONGE 4X4 12PLY STRL (GAUZE/BANDAGES/DRESSINGS) ×1 IMPLANT
GLOVE BIOGEL PI IND STRL 7.0 (GLOVE) ×1 IMPLANT
GLOVE BIOGEL PI INDICATOR 7.0 (GLOVE) ×2
GLOVE EUDERMIC 7 POWDERFREE (GLOVE) ×6 IMPLANT
GOWN SPEC L4 XLG W/TWL (GOWN DISPOSABLE) ×3 IMPLANT
GOWN STRL REUS W/ TWL XL LVL3 (GOWN DISPOSABLE) ×3 IMPLANT
GOWN STRL REUS W/TWL LRG LVL3 (GOWN DISPOSABLE) ×5 IMPLANT
GOWN STRL REUS W/TWL XL LVL3 (GOWN DISPOSABLE) ×3
LEGGING LITHOTOMY PAIR STRL (DRAPES) IMPLANT
LIGASURE IMPACT 36 18CM CVD LR (INSTRUMENTS) ×3 IMPLANT
NS IRRIG 1000ML POUR BTL (IV SOLUTION) ×6 IMPLANT
PACK COLON (CUSTOM PROCEDURE TRAY) ×3 IMPLANT
PACK GENERAL/GYN (CUSTOM PROCEDURE TRAY) ×3 IMPLANT
PAD ABD 8X10 STRL (GAUZE/BANDAGES/DRESSINGS) ×4 IMPLANT
POUCH OSTOMY 2  H (OSTOMY) ×3 IMPLANT
SHEARS HARMONIC ACE PLUS 36CM (ENDOMECHANICALS) IMPLANT
SPONGE LAP 18X18 X RAY DECT (DISPOSABLE) ×10 IMPLANT
STAPLER CUT CVD 40MM GREEN (STAPLE) ×2 IMPLANT
STAPLER PROXIMATE 75MM BLUE (STAPLE) ×2 IMPLANT
STAPLER VISISTAT 35W (STAPLE) ×3 IMPLANT
SUT NOV 1 T60/GS (SUTURE) IMPLANT
SUT NOVA 1 T20/GS 25DT (SUTURE) ×10 IMPLANT
SUT NOVA NAB DX-16 0-1 5-0 T12 (SUTURE) IMPLANT
SUT NOVA T20/GS 25 (SUTURE) IMPLANT
SUT PDS AB 1 CTX 36 (SUTURE) IMPLANT
SUT PDS AB 1 TP1 96 (SUTURE) ×2 IMPLANT
SUT PROLENE 2 0 KS (SUTURE) IMPLANT
SUT PROLENE 2 0 SH DA (SUTURE) ×2 IMPLANT
SUT SILK 2 0 (SUTURE) ×3
SUT SILK 2 0 SH CR/8 (SUTURE) ×3 IMPLANT
SUT SILK 2 0SH CR/8 30 (SUTURE) IMPLANT
SUT SILK 2-0 18XBRD TIE 12 (SUTURE) ×1 IMPLANT
SUT SILK 2-0 30XBRD TIE 12 (SUTURE) IMPLANT
SUT SILK 3 0 (SUTURE) ×3
SUT SILK 3 0 SH CR/8 (SUTURE) ×5 IMPLANT
SUT SILK 3-0 18XBRD TIE 12 (SUTURE) ×1 IMPLANT
SUT VIC AB 2-0 SH 18 (SUTURE) ×4 IMPLANT
SUT VIC AB 2-0 SH 27 (SUTURE) ×3
SUT VIC AB 2-0 SH 27X BRD (SUTURE) IMPLANT
SUT VIC AB 3-0 SH 18 (SUTURE) ×2 IMPLANT
SUT VICRYL 2 0 18  UND BR (SUTURE) ×2
SUT VICRYL 2 0 18 UND BR (SUTURE) IMPLANT
SWAB COLLECTION DEVICE MRSA (MISCELLANEOUS) ×3 IMPLANT
TAPE CLOTH SURG 6X10 WHT LF (GAUZE/BANDAGES/DRESSINGS) ×2 IMPLANT
TRAY FOLEY CATH 16FRSI W/METER (SET/KITS/TRAYS/PACK) IMPLANT
TUBE ANAEROBIC SPECIMEN COL (MISCELLANEOUS) ×2 IMPLANT
YANKAUER SUCT BULB TIP NO VENT (SUCTIONS) ×3 IMPLANT

## 2014-05-03 NOTE — Consult Note (Signed)
Patient Frank [name]      DOB: Jul 20, 1945      JOI:[id number]     Consult Note from the Palliative Medicine Team at [facility] Requested by: Dr [name]     PCP: [name], MD Reason for Consultation:Goals of Care     Phone Number:[phone]  Assessment/Recommendations: 68 yo male with PMHx of stage IV Colon CA, CAD who presented with abdominal pain, N/V and found to have perforated sigmoid colon with peritonitis  1.  Code Status: DNR  2. Goals of Care: Family had multiple discussions with several providers. Ultimately difficult situation.  They were hoping there was an option to continue abx for few days to see if they were working.  They feel like they heard this from someone else.  With this option I think they are more likely facing his passing in coming days to weeks.  Again discussed that even with surgery, likely talking long recovery with colostomy and still incurable cancer.  Family/pt would like to talk with surgery one more time before making decision.  We talked about comfort care/hospice would look like if they potentially chose this route.  Knows that surgery potentially buys him more time but worries about what that time would look like. Drustrated that he always feels bad after chemo.  If they do decide to pursue comfort care, case management should be consulted for home hospice care.  They would also have option of residential hospice if they desired and this is arranged through social work.     3. Symptom Management:   1. Abd Pain- 2/2 peritonitis.  Morphine PRN controlling. Would have low threshold to increase if pain becomes more intense.  4. Psychosocial/Spiritual: married with 2 sons. Lives in [place].      Brief HPI: 68 yo male with PMHx of CAD, recently diagnosed stage IV colon CA.  He follows with Dr [name] and recently started chemotherapy.  He has completed 2 cycles thus far.  Presented yesterday with several day history of abd  pain, N/V.  Abd pain largely worse with movement and diminished at rest.  On admission, CT scan revealed perforated sigmoid colon, ascites with mesenteric infiltration consistent with peritonitis. On IV abx.  Family discussed with multiple physicians option of surgery with colostomy vs comfort care.  They are undecided at the moment.     PMH:  Past Medical History  Diagnosis Date  . Hyperlipidemia   . Hypertension   . Ischemic heart disease   . Obesity      PSH: Past Surgical History  Procedure Laterality Date  . Coronary artery bypass graft    . Back surgery     I have reviewed the FH and SH and  If appropriate update it with new information. Allergies  Allergen Reactions  . Simvastatin Other (See Comments)    "Muscle aches"   Scheduled Meds: . ceFEPime (MAXIPIME) IV  2 g Intravenous Q8H  . metronidazole  500 mg Intravenous Q8H  . pantoprazole (PROTONIX) IV  40 mg Intravenous Q24H  . sodium chloride  3 mL Intravenous Q12H   Continuous Infusions: . sodium chloride 150 mL/hr (05/03/14 0955)   PRN Meds:.[DISCONTINUED] acetaminophen **OR** acetaminophen, magic mouthwash w/lidocaine, morphine injection, [DISCONTINUED] ondansetron **OR** ondansetron (ZOFRAN) IV, sodium chloride    BP 129/71 mmHg  Pulse 122  Temp(Src) 98.4 F (36.9 C) (Oral)  Resp 19  Ht 5\' 8"  (1.727 m)  Wt 79.6 kg (175 lb 7.8 oz)  BMI 26.69  kg/m2  SpO2 97%   PPS: 70   Intake/Output Summary (Last 24 hours) at 05/03/14 1039 Last data filed at 05/03/14 0417  Gross per 24 hour  Intake    160 ml  Output    350 ml  Net   -190 ml  Physical Exam:  General: alert, NAD HEENT:  Bryan, sclera anicteric, mmm Skin: warm/dry  Labs: CBC    Component Value Date/Time   WBC 1.1* 05/03/2014 0033   WBC 1.1* 05/02/2014 1510   RBC 3.50* 05/03/2014 0033   RBC 3.65* 05/02/2014 1510   HGB 9.5* 05/03/2014 0033   HGB 9.8* 05/02/2014 1510   HCT 30.0* 05/03/2014 0033   HCT 30.7* 05/02/2014 1510   PLT 84*  05/03/2014 0033   PLT 85* 05/02/2014 1510   MCV 85.7 05/03/2014 0033   MCV 84.1 05/02/2014 1510   MCH 27.1 05/03/2014 0033   MCH 26.8* 05/02/2014 1510   MCHC 31.7 05/03/2014 0033   MCHC 31.9* 05/02/2014 1510   RDW 17.2* 05/03/2014 0033   RDW 17.2* 05/02/2014 1510   LYMPHSABS 0.1* 05/03/2014 0033   LYMPHSABS 0.1* 05/02/2014 1510   MONOABS 0.2 05/03/2014 0033   MONOABS 0.2 05/02/2014 1510   EOSABS 0.0 05/03/2014 0033   EOSABS 0.0 05/02/2014 1510   BASOSABS 0.0 05/03/2014 0033   BASOSABS 0.0 05/02/2014 1510    BMET    Component Value Date/Time   NA 135 05/03/2014 0033   NA 136 05/02/2014 1511   K 3.8 05/03/2014 0033   K 4.0 05/02/2014 1511   CL 106 05/03/2014 0033   CO2 23 05/03/2014 0033   CO2 22 05/02/2014 1511   GLUCOSE 117* 05/03/2014 0033   GLUCOSE 145* 05/02/2014 1511   BUN 25* 05/03/2014 0033   BUN 23.7 05/02/2014 1511   CREATININE 1.04 05/03/2014 0033   CREATININE 1.0 05/02/2014 1511   CALCIUM 8.0* 05/03/2014 0033   CALCIUM 8.4 05/02/2014 1511   GFRNONAA 71* 05/03/2014 0033   GFRAA 82* 05/03/2014 0033    CMP     Component Value Date/Time   NA 135 05/03/2014 0033   NA 136 05/02/2014 1511   K 3.8 05/03/2014 0033   K 4.0 05/02/2014 1511   CL 106 05/03/2014 0033   CO2 23 05/03/2014 0033   CO2 22 05/02/2014 1511   GLUCOSE 117* 05/03/2014 0033   GLUCOSE 145* 05/02/2014 1511   BUN 25* 05/03/2014 0033   BUN 23.7 05/02/2014 1511   CREATININE 1.04 05/03/2014 0033   CREATININE 1.0 05/02/2014 1511   CALCIUM 8.0* 05/03/2014 0033   CALCIUM 8.4 05/02/2014 1511   PROT 5.5* 05/03/2014 0033   PROT 5.6* 05/02/2014 1511   ALBUMIN 2.2* 05/03/2014 0033   ALBUMIN 2.0* 05/02/2014 1511   AST 11 05/03/2014 0033   AST 9 05/02/2014 1511   ALT 9 05/03/2014 0033   ALT 9 05/02/2014 1511   ALKPHOS 139* 05/03/2014 0033   ALKPHOS 104 05/02/2014 1511   BILITOT 1.0 05/03/2014 0033   BILITOT 0.79 05/02/2014 1511   GFRNONAA 71* 05/03/2014 0033   GFRAA 82* 05/03/2014 0033     12/23 CT Abd/Pelvis IMPRESSION: Interval sigmoid colonic perforation adjacent to the previously seen masslike wall thickening compatible with primary colonic carcinoma and intra-abdominal abscess formation.  Small bowel wall thickening may suggest secondary enteritis. No evidence for small bowel obstruction at this time.  Diffusely rim enhancing large volume ascites and mesenteric infiltration suggesting peritonitis.  Decrease in size of hepatic representative metastatic lesion.  Total Time: 50 minutes  Greater than 50%  of this time was spent counseling and coordinating care related to the above assessment and plan. Case discussed with Dr Dorena Dew D.O. Palliative Medicine Team at [facility]  Pager: [phone] Team Phone: [phone]

## 2014-05-03 NOTE — Progress Notes (Signed)
Patient ID: Frank [name], male   DOB: 1945-10-18, 68 y.o.   MRN: [id number] Chief Complaint:  Perforated sigmoid colon after chemotherapy  History of Present Illness:  Frank [name] is an 68 y.o. male admitted with several days of worsening abdominal pain.  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  Now with perforated colon, abscess and a WBC of 1,000 secondary to chemotherapy.  Without surgery he will likely succumb to sepsis and with surgery he will die with a colostomy.  Will discuss with [facility] team and his family.  I don't know if medical oncology has discussed Frank [name] with palliative care but that seems appropriate at this time.    Past Medical History  Diagnosis Date  . Hyperlipidemia   . Hypertension   . Ischemic heart disease   . Obesity     Past Surgical History  Procedure Laterality Date  . Coronary artery bypass graft    . Back surgery      Current Facility-Administered Medications  Medication Dose Route Frequency Provider Last Rate Last Dose  . acetaminophen (TYLENOL) suppository 650 mg  650 mg Rectal Q6H PRN Berle Mull, MD      . ceFEPIme (MAXIPIME) 2 g in dextrose 5 % 50 mL IVPB  2 g Intravenous Q8H Berle Mull, MD   2 g at 05/03/14 0144  . magic mouthwash w/lidocaine  15 mL Oral QID PRN Berle Mull, MD      . metroNIDAZOLE (FLAGYL) IVPB 500 mg  500 mg Intravenous Q8H Berle Mull, MD   500 mg at 05/03/14 0041  . morphine 2 MG/ML injection 2 mg  2 mg Intravenous Q3H PRN Berle Mull, MD   2 mg at 05/03/14 0502  . ondansetron (ZOFRAN) injection 4 mg  4 mg Intravenous Q6H PRN Berle Mull, MD   4 mg at 05/02/14 2024  . pantoprazole (PROTONIX) injection 40 mg  40 mg Intravenous Q24H Berle Mull, MD   40 mg at 05/03/14 0149  . sodium chloride 0.9 % injection 10-40 mL  10-40 mL Intracatheter PRN Berle Mull, MD   10 mL at 05/03/14 0130  . sodium chloride 0.9 % injection 3 mL  3 mL Intravenous Q12H Berle Mull, MD   3 mL at 05/02/14  2200   Simvastatin Family History  Problem Relation Age of Onset  . Cancer Father 62  . Peripheral vascular disease Mother 25    deceased   Social History:   reports that he has quit smoking. He does not have any smokeless tobacco history on file. He reports that he drinks about 0.6 oz of alcohol per week. He reports that he does not use illicit drugs.   REVIEW OF SYSTEMS : Negative except for see old chart  Physical Exam:   Blood pressure 129/71, pulse 122, temperature 98.4 F (36.9 C), temperature source Oral, resp. rate 19, height _0  (1.727 m), weight 175 lb 7.8 oz (79.6 kg), SpO2 97 %. Body mass index is 26.69 kg/(m^2).  Gen:  WDWN WM delerious  Neurological: delerious and says he is at [facility]: Normocephalic and atraumatic.  Cardiovascular:  Prior open heart surgery  Respiratory: Effort shallow  No respiratory distress. No chest wall tenderness. Abdomen:  Rebound and guarding of the abdome GU:  Not examined Musculoskeletal: Normal range of motion. Extremities are nontender. No cyanosis, edema or clubbing noted Lymphadenopathy: No cervical, preauricular, postauricular or axillary adenopathy is present Skin: Skin is  warm and dry. No rash noted. No diaphoresis. No erythema. No pallor. Pscyh: confused;  Maybe be medications- not competent LABORATORY RESULTS: Results for orders placed or performed during the hospital encounter of 05/02/14 (from the past 48 hour(s))  Glucose, capillary     Status: Abnormal   Collection Time: 05/02/14  9:20 PM  Result Value Ref Range   Glucose-Capillary 106 (H) 70 - 99 mg/dL  CBC with Differential     Status: Abnormal   Collection Time: 05/03/14 12:33 AM  Result Value Ref Range   WBC 1.1 (LL) 4.0 - 10.5 K/uL    Comment: REPEATED TO VERIFY CRITICAL RESULT CALLED TO, READ BACK BY AND VERIFIED WITH: VERGEL DE DIOS,L/4E _0  BY KARCZEWSKI,S.    RBC 3.50 (L) 4.22 - 5.81 MIL/uL   Hemoglobin 9.5 (L) 13.0 - 17.0 g/dL   HCT  30.0 (L) 39.0 - 52.0 %   MCV 85.7 78.0 - 100.0 fL   MCH 27.1 26.0 - 34.0 pg   MCHC 31.7 30.0 - 36.0 g/dL   RDW 17.2 (H) 11.5 - 15.5 %   Platelets 84 (L) 150 - 400 K/uL    Comment: REPEATED TO VERIFY SPECIMEN CHECKED FOR CLOTS PLATELET COUNT CONFIRMED BY SMEAR    Neutrophils Relative % 72 43 - 77 %   Lymphocytes Relative 13 12 - 46 %   Monocytes Relative 15 (H) 3 - 12 %   Eosinophils Relative 0 0 - 5 %   Basophils Relative 0 0 - 1 %   Neutro Abs 0.8 (L) 1.7 - 7.7 K/uL   Lymphs Abs 0.1 (L) 0.7 - 4.0 K/uL   Monocytes Absolute 0.2 0.1 - 1.0 K/uL   Eosinophils Absolute 0.0 0.0 - 0.7 K/uL   Basophils Absolute 0.0 0.0 - 0.1 K/uL   WBC Morphology TOXIC GRANULATION     Comment: DOHLE BODIES  Comprehensive metabolic panel     Status: Abnormal   Collection Time: 05/03/14 12:33 AM  Result Value Ref Range   Sodium 135 135 - 145 mmol/L    Comment: Please note change in reference range.   Potassium 3.8 3.5 - 5.1 mmol/L    Comment: Please note change in reference range.   Chloride 106 96 - 112 mEq/L   CO2 23 19 - 32 mmol/L   Glucose, Bld 117 (H) 70 - 99 mg/dL   BUN 25 (H) 6 - 23 mg/dL   Creatinine, Ser 1.04 0.50 - 1.35 mg/dL   Calcium 8.0 (L) 8.4 - 10.5 mg/dL   Total Protein 5.5 (L) 6.0 - 8.3 g/dL   Albumin 2.2 (L) 3.5 - 5.2 g/dL   AST 11 0 - 37 U/L   ALT 9 0 - 53 U/L   Alkaline Phosphatase 139 (H) 39 - 117 U/L   Total Bilirubin 1.0 0.3 - 1.2 mg/dL   GFR calc non Af Amer 71 (L) >90 mL/min   GFR calc Af Amer 82 (L) >90 mL/min    Comment: (NOTE) The eGFR has been calculated using the CKD EPI equation. This calculation has not been validated in all clinical situations. eGFR's persistently <90 mL/min signify possible Chronic Kidney Disease.    Anion gap 6 5 - 15  Urinalysis, Routine w reflex microscopic     Status: Abnormal   Collection Time: 05/03/14 12:39 AM  Result Value Ref Range   Color, Urine AMBER (A) YELLOW    Comment: BIOCHEMICALS MAY BE AFFECTED BY COLOR   APPearance  CLEAR CLEAR   Specific Gravity, Urine  1.038 (H) 1.005 - 1.030   pH 5.5 5.0 - 8.0   Glucose, UA NEGATIVE NEGATIVE mg/dL   Hgb urine dipstick NEGATIVE NEGATIVE   Bilirubin Urine SMALL (A) NEGATIVE   Ketones, ur NEGATIVE NEGATIVE mg/dL   Protein, ur 100 (A) NEGATIVE mg/dL   Urobilinogen, UA 1.0 0.0 - 1.0 mg/dL   Nitrite NEGATIVE NEGATIVE   Leukocytes, UA NEGATIVE NEGATIVE  Urine microscopic-add on     Status: Abnormal   Collection Time: 05/03/14 12:39 AM  Result Value Ref Range   WBC, UA 0-2 <3 WBC/hpf   Bacteria, UA FEW (A) RARE   Casts GRANULAR CAST (A) NEGATIVE  Ammonia     Status: None   Collection Time: 05/03/14  1:30 AM  Result Value Ref Range   Ammonia 17 11 - 32 umol/L    Comment: Please note change in reference range.  Lactic acid, plasma     Status: None   Collection Time: 05/03/14  1:30 AM  Result Value Ref Range   Lactic Acid, Venous 1.4 0.5 - 2.2 mmol/L  Lipase, blood     Status: Abnormal   Collection Time: 05/03/14  1:30 AM  Result Value Ref Range   Lipase <10 (L) 11 - 59 U/L    Comment: REPEATED TO VERIFY  Protime-INR     Status: Abnormal   Collection Time: 05/03/14  1:30 AM  Result Value Ref Range   Prothrombin Time 19.4 (H) 11.6 - 15.2 seconds   INR 1.62 (H) 0.00 - 1.49  Type and screen     Status: None   Collection Time: 05/03/14  1:30 AM  Result Value Ref Range   ABO/RH(D) B POS    Antibody Screen NEG    Sample Expiration 05/06/2014      RADIOLOGY RESULTS: Ct Head Wo Contrast  05/02/2014   CLINICAL DATA:  Increased confusion today with nausea and vomiting  EXAM: CT HEAD WITHOUT CONTRAST  TECHNIQUE: Contiguous axial images were obtained from the base of the skull through the vertex without intravenous contrast.  COMPARISON:  None  FINDINGS: Advanced atrophy with prominence of the bifrontal extra-axial spaces. Gray-white differentiation is maintained. No CT evidence of acute large territory infarct. No intraparenchymal extra-axial mass or hemorrhage.  Unchanged size and configuration of the ventricles and basilar cisterns. No midline shift.  Limited visualization of the paranasal sinuses and mastoid air cells are normal. No air-fluid level. There is potential enlargement of the sella (image 9, series 3).  Regional soft tissues appear normal. Paranasal sinuses and mastoid air cells are normal. No displaced calvarial fracture.  IMPRESSION: 1. Advanced atrophy without acute intracranial process. 2. Nonspecific potential enlargement of the sella. Further evaluation could be performed with nonemergent brain MRI as clinically indicated.   Electronically Signed   By: [name] M.D.   On: 05/02/2014 21:51   Ct Abdomen Pelvis W Contrast  05/02/2014   CLINICAL DATA:  Mid abdominal pain, nausea and vomiting, after chemotherapy. History of colon cancer.  EXAM: CT ABDOMEN AND PELVIS WITH CONTRAST  TECHNIQUE: Multidetector CT imaging of the abdomen and pelvis was performed using the standard protocol following bolus administration of intravenous contrast.  CONTRAST:  178m OMNIPAQUE IOHEXOL 300 MG/ML  SOLN  COMPARISON:  PET-CT 03/07/2014, CT abdomen/ pelvis 02/22/2014  FINDINGS: Evidence of CABG partly visualized. Trace pleural effusions are noted with associated bibasilar linear presumed atelectasis.  A few foci of free intraperitoneal air are identified image 19. Epicardial nodes are identified, 0.6 cm diameter image 12.  Innumerable hepatic masses are reidentified with overall nodular liver contour suggesting underlying cirrhosis. Representative posterior segment right hepatic lobe mass is slightly smaller, now 5.4 x 4.7 cm image 22, previously 6.8 x 5.8 cm.  Porta hepatis node is stable measuring 0.9 cm image 30. Stable subcentimeter periaortic lymph nodes.  There are numerous loops of small bowel with diameter at upper limits of normal, representative 3.1 cm diameter loop of bowel within the left upper quadrant image 32, but which demonstrate abnormal wall  thickening. Within the colon, the previously seen area of masslike sigmoid wall thickening is reidentified, image 71. There is a new rim enhancing gas/ fluid collection compatible with abscess from colonic perforation image 70, measuring 3.5 x 3.3 cm. Foci of gas are noted tracking throughout the mesentery as well. Large volume ascites is noted with interval development of rim enhancement suggesting superimposed infection, for example image 72. Pericolonic nodes are reidentified, representative 0.6 cm node image 64. There is increased pericolonic stranding. Increased stranding and fluid infiltration of the mesentery/omentum is noted without measurable nodularity.  Aortic atheromatous calcification without aneurysm. Fat containing left inguinal hernia noted. The bladder is unremarkable.  Evidence of right L5 laminectomy. Multilevel lumbar spine disc degenerative change noted. No focal osseous lesion is identified. The bones are subjectively mildly sclerotic, unchanged, raising the question of possible metastatic infiltration although this could be a normal variant.  IMPRESSION: Interval sigmoid colonic perforation adjacent to the previously seen masslike wall thickening compatible with primary colonic carcinoma and intra-abdominal abscess formation.  Small bowel wall thickening may suggest secondary enteritis. No evidence for small bowel obstruction at this time.  Diffusely rim enhancing large volume ascites and mesenteric infiltration suggesting peritonitis.  Decrease in size of hepatic representative metastatic lesion.  Critical Value/emergent results were called by telephone at the time of interpretation on 05/02/2014 at 11:38 pm to [facility] for Dr. Berle Mull , who verbally acknowledged these results.   Electronically Signed   By: Conchita Paris M.D.   On: 05/02/2014 23:40    Problem List: Patient Active Problem List   Diagnosis Date Noted  . Perforated bowel 05/03/2014  . Nausea & vomiting 05/02/2014   . Anorexia 05/02/2014  . Weight loss 05/02/2014  . Weakness 05/02/2014  . Hypoalbuminemia 05/02/2014  . Other pancytopenia 05/02/2014  . Abdominal pain 05/02/2014  . Dehydration 05/02/2014  . Altered mental status 05/02/2014  . Mucositis due to antineoplastic therapy 05/02/2014  . Intractable nausea and vomiting 05/02/2014  . Acute encephalopathy 05/02/2014  . Hypersensitivity reaction 03/29/2014  . Malignant neoplasm of colon 03/13/2014  . Acute blood loss anemia 03/02/2014  . Thrombocytopenia 03/02/2014  . Leukocytosis 03/02/2014  . Sepsis 02/22/2014  . Colitis 02/22/2014  . Liver masses 02/22/2014  . Premature atrial contractions 09/13/2013  . Pure hypercholesterolemia 02/15/2013  . Benign hypertensive heart disease without heart failure 02/15/2013    Assessment & Plan: Stage IV colon cancer with widespread mets to the liver; now with perforation secondary to chemotherapy and peritonitis with pain.  Needs family discussion about palliative care;   Surgery may help the pain somewhat but will give him a colostomy and will not change the end result of widespread metastatic disease.         Matt B. Hassell Done, MD, [facility] [phone] beeper [phone]  05/03/2014 6:34 AM

## 2014-05-03 NOTE — Transfer of Care (Signed)
Immediate Anesthesia Transfer of Care Note  Patient: Frank [name]  Procedure(s) Performed: Procedure(s): SIGMOID AND LEFT COLON COLECTOMY, END COLOSTOMY, MOBILIZATION OF SPLENIC FLEXURE (N/A)  Patient Location: PACU  Anesthesia Type:General  Level of Consciousness: sedated, patient cooperative and responds to stimulation  Airway & Oxygen Therapy: Patient Spontanous Breathing and Patient connected to face mask oxygen  Post-op Assessment: Report given to PACU RN, Post -op Vital signs reviewed and stable and Patient moving all extremities X 4  Post vital signs: stable  Complications: No apparent anesthesia complications

## 2014-05-03 NOTE — Anesthesia Postprocedure Evaluation (Signed)
  Anesthesia Post-op Note  Patient: Frank [name]  Procedure(s) Performed: Procedure(s) (LRB): SIGMOID AND LEFT COLON COLECTOMY, END COLOSTOMY, MOBILIZATION OF SPLENIC FLEXURE (N/A)  Patient Location: PACU  Anesthesia Type: General  Level of Consciousness: awake and alert   Airway and Oxygen Therapy: Patient Spontanous Breathing  Post-op Pain: mild  Post-op Assessment: Post-op Vital signs reviewed, Patient's Cardiovascular Status Stable, Respiratory Function Stable, Patent Airway and No signs of Nausea or vomiting  Last Vitals:  Filed Vitals:   05/03/14 2100  BP:   Pulse: 119  Temp:   Resp: 17    Post-op Vital Signs: stable   Complications: No apparent anesthesia complications

## 2014-05-03 NOTE — Progress Notes (Signed)
Oncology addendum:  The case was discussed with Dr. Alphonsa Overall from surgery, Dr. [name] from palliative medicine and Dr. Benay Spice the on call for Medical Oncology for the day. The main decision for the patient and his family is wether to proceed with surgery or not. Proceeding with surgery means prolonged recovery and potential complications in light of incurable cancer. On the other hand, not proceeding with surgery likely means eminent death.  I appreciate help from the hospitalist team, Gen. surgery and palliative medicine. For any questions regarding medical oncology issues, please contact Dr. Benay Spice who is on-call for the day.

## 2014-05-03 NOTE — Progress Notes (Signed)
[facility] PROGRESS NOTE  Frank [name] ZOX:[id number] DOB: 21-Feb-1946 DOA: 05/02/2014 PCP: [name], MD  Assessment/Plan: 1-abd pain, nausea, vomiting: due to bowel perforation and peritonitis -will treat with PRN antiemetics and analgesics -patient has decided to pursuit surgical intervention -continue IV antibiotics -follow general surgery rec's -given ongoing tachcyardia and plan surgery will add low dose of IV metoprolol (which would also prevent rebound tachycardia) -remains NPO state  2-tachycardia: due to mild dehydration and pain -will use low dose IV metoprolol (to prevent rebound) -will monitor  3-pancytopenia: due to ongoing chemotherapy -will avoid heparin products -follow blood ceel count and transfuse as needed -oncology on board and will follow any specific rec's  4-chronic diastolic heart failure -compensated -will hold lasix at this moment -daily weights and strict intake/output -low dose b-blocker ordered  5-essential HTN: overall stable -will follow VS -on low dose metoprolol IV (to prevent rebound)  6-GI prophylaxis: continue PPI  Code Status: DNR Family Communication: wife, son, daughter in law at bedside Disposition Plan: to be determine    Consultants:  Oncology  General surgery (Dr. Lucia Gaskins)  Palliative Care  Procedures:  See below for x-ray reports  Plan is for abdominal surgery tentative 12/24  Antibiotics:  Cefepime and flagyl 12/23  HPI/Subjective: Tachycardic, complaining of mild discomfort in abd after morphine given. No nausea or vomiting; currently afebrile.   Objective: Filed Vitals:   05/03/14 0415  BP: 129/71  Pulse: 122  Temp: 98.4 F (36.9 C)  Resp: 19    Intake/Output Summary (Last 24 hours) at 05/03/14 1114 Last data filed at 05/03/14 0417  Gross per 24 hour  Intake    160 ml  Output    350 ml  Net   -190 ml   Filed Weights   05/02/14 1744 05/03/14 0415  Weight: 78.642 kg (173 lb 6  oz) 79.6 kg (175 lb 7.8 oz)    Exam:   General:  Calm and sore in abdomen; no fever. Denies nausea or vomiting  Cardiovascular: tachycardia, no rubs or gallops; no JVD; trace edema LE   Respiratory: CTA bilaterally  Abdomen: tender to palpation, decrease to absent BS; slight distension   Musculoskeletal: no cyanosis   Data Reviewed: Basic Metabolic Panel:  Recent Labs Lab 05/02/14 1511 05/03/14 0033  NA 136 135  K 4.0 3.8  CL  --  106  CO2 22 23  GLUCOSE 145* 117*  BUN 23.7 25*  CREATININE 1.0 1.04  CALCIUM 8.4 8.0*   Liver Function Tests:  Recent Labs Lab 05/02/14 1511 05/03/14 0033  AST 9 11  ALT 9 9  ALKPHOS 104 139*  BILITOT 0.79 1.0  PROT 5.6* 5.5*  ALBUMIN 2.0* 2.2*    Recent Labs Lab 05/03/14 0130  LIPASE <10*    Recent Labs Lab 05/03/14 0130  AMMONIA 17   CBC:  Recent Labs Lab 05/02/14 1510 05/03/14 0033  WBC 1.1* 1.1*  NEUTROABS 0.9* 0.8*  HGB 9.8* 9.5*  HCT 30.7* 30.0*  MCV 84.1 85.7  PLT 85* 84*   CBG:  Recent Labs Lab 05/02/14 2120 05/03/14 0723  GLUCAP 106* 103*    Recent Results (from the past 240 hour(s))  TECHNOLOGIST REVIEW     Status: None   Collection Time: 05/02/14  3:10 PM  Result Value Ref Range Status   Technologist Review Rare meta  Final     Studies: Ct Head Wo Contrast  05/02/2014   CLINICAL DATA:  Increased confusion today with nausea and vomiting  EXAM:  CT HEAD WITHOUT CONTRAST  TECHNIQUE: Contiguous axial images were obtained from the base of the skull through the vertex without intravenous contrast.  COMPARISON:  None  FINDINGS: Advanced atrophy with prominence of the bifrontal extra-axial spaces. Gray-white differentiation is maintained. No CT evidence of acute large territory infarct. No intraparenchymal extra-axial mass or hemorrhage. Unchanged size and configuration of the ventricles and basilar cisterns. No midline shift.  Limited visualization of the paranasal sinuses and mastoid air cells are  normal. No air-fluid level. There is potential enlargement of the sella (image 9, series 3).  Regional soft tissues appear normal. Paranasal sinuses and mastoid air cells are normal. No displaced calvarial fracture.  IMPRESSION: 1. Advanced atrophy without acute intracranial process. 2. Nonspecific potential enlargement of the sella. Further evaluation could be performed with nonemergent brain MRI as clinically indicated.   Electronically Signed   By: [name] M.D.   On: 05/02/2014 21:51   Ct Abdomen Pelvis W Contrast  05/02/2014   CLINICAL DATA:  Mid abdominal pain, nausea and vomiting, after chemotherapy. History of colon cancer.  EXAM: CT ABDOMEN AND PELVIS WITH CONTRAST  TECHNIQUE: Multidetector CT imaging of the abdomen and pelvis was performed using the standard protocol following bolus administration of intravenous contrast.  CONTRAST:  170mL OMNIPAQUE IOHEXOL 300 MG/ML  SOLN  COMPARISON:  PET-CT 03/07/2014, CT abdomen/ pelvis 02/22/2014  FINDINGS: Evidence of CABG partly visualized. Trace pleural effusions are noted with associated bibasilar linear presumed atelectasis.  A few foci of free intraperitoneal air are identified image 19. Epicardial nodes are identified, 0.6 cm diameter image 12.  Innumerable hepatic masses are reidentified with overall nodular liver contour suggesting underlying cirrhosis. Representative posterior segment right hepatic lobe mass is slightly smaller, now 5.4 x 4.7 cm image 22, previously 6.8 x 5.8 cm.  Porta hepatis node is stable measuring 0.9 cm image 30. Stable subcentimeter periaortic lymph nodes.  There are numerous loops of small bowel with diameter at upper limits of normal, representative 3.1 cm diameter loop of bowel within the left upper quadrant image 32, but which demonstrate abnormal wall thickening. Within the colon, the previously seen area of masslike sigmoid wall thickening is reidentified, image 71. There is a new rim enhancing gas/ fluid collection  compatible with abscess from colonic perforation image 70, measuring 3.5 x 3.3 cm. Foci of gas are noted tracking throughout the mesentery as well. Large volume ascites is noted with interval development of rim enhancement suggesting superimposed infection, for example image 72. Pericolonic nodes are reidentified, representative 0.6 cm node image 64. There is increased pericolonic stranding. Increased stranding and fluid infiltration of the mesentery/omentum is noted without measurable nodularity.  Aortic atheromatous calcification without aneurysm. Fat containing left inguinal hernia noted. The bladder is unremarkable.  Evidence of right L5 laminectomy. Multilevel lumbar spine disc degenerative change noted. No focal osseous lesion is identified. The bones are subjectively mildly sclerotic, unchanged, raising the question of possible metastatic infiltration although this could be a normal variant.  IMPRESSION: Interval sigmoid colonic perforation adjacent to the previously seen masslike wall thickening compatible with primary colonic carcinoma and intra-abdominal abscess formation.  Small bowel wall thickening may suggest secondary enteritis. No evidence for small bowel obstruction at this time.  Diffusely rim enhancing large volume ascites and mesenteric infiltration suggesting peritonitis.  Decrease in size of hepatic representative metastatic lesion.  Critical Value/emergent results were called by telephone at the time of interpretation on 05/02/2014 at 11:38 pm to [facility] for Dr. Berle Mull ,  who verbally acknowledged these results.   Electronically Signed   By: Conchita Paris M.D.   On: 05/02/2014 23:40    Scheduled Meds: . ceFEPime (MAXIPIME) IV  2 g Intravenous Q8H  . metronidazole  500 mg Intravenous Q8H  . pantoprazole (PROTONIX) IV  40 mg Intravenous Q24H  . sodium chloride  3 mL Intravenous Q12H   Continuous Infusions: . sodium chloride 150 mL/hr (05/03/14 0955)    Principal Problem:    Perforated bowel Active Problems:   Liver masses   Malignant neoplasm of colon   Abdominal pain   Dehydration   Mucositis due to antineoplastic therapy   Intractable nausea and vomiting   Acute encephalopathy   Palliative care encounter    Time spent: 35 minutes (more than 50% of time dedicated to face to face examination, discussion and care coordination)    Barton Dubois  [facility] Pager [phone]. If 7PM-7AM, please contact night-coverage at [email or web address], password [facility] 05/03/2014, 11:14 AM  LOS: 1 day

## 2014-05-03 NOTE — Anesthesia Preprocedure Evaluation (Signed)
Anesthesia Evaluation  Patient identified by MRN, date of birth, ID band Patient awake    Reviewed: Allergy & Precautions, H&P , NPO status , Patient's Chart, lab work & pertinent test results  Airway Mallampati: II  TM Distance: >3 FB Neck ROM: Full    Dental no notable dental hx.    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   Endo/Other  negative endocrine ROS  Renal/GU negative Renal ROS  negative genitourinary   Musculoskeletal negative musculoskeletal ROS (+)   Abdominal   Peds negative pediatric ROS (+)  Hematology Pancytopenia  INR 1.6    Anesthesia Other Findings   Reproductive/Obstetrics negative OB ROS                             Anesthesia Physical Anesthesia Plan  ASA: IV  Anesthesia Plan: General   Post-op Pain Management:    Induction: Rapid sequence, Cricoid pressure planned and Intravenous  Airway Management Planned: Oral ETT  Additional Equipment: Arterial line and CVP  Intra-op Plan:   Post-operative Plan: Possible Post-op intubation/ventilation  Informed Consent: I have reviewed the patients History and Physical, chart, labs and discussed the procedure including the risks, benefits and alternatives for the proposed anesthesia with the patient or authorized representative who has indicated his/her understanding and acceptance.   Dental advisory given  Plan Discussed with:   Anesthesia Plan Comments: (High risk for complications)        Anesthesia Quick Evaluation

## 2014-05-03 NOTE — Progress Notes (Addendum)
ANTIBIOTIC CONSULT NOTE - INITIAL  Pharmacy Consult for Cefepime Indication: Perforated bowel, peritonitis  Allergies  Allergen Reactions  . Simvastatin Other (See Comments)    "Muscle aches"    Patient Measurements: Height: 5\' 8"  (172.7 cm) Weight: 173 lb 6 oz (78.642 kg) IBW/kg (Calculated) : 68.4  Vital Signs: Temp: 98.1 F (36.7 C) (12/23 2035) Temp Source: Oral (12/23 2035) BP: 118/73 mmHg (12/23 2035) Pulse Rate: 116 (12/23 2035) Intake/Output from previous day:   Intake/Output from this shift:    Labs:  Recent Labs  05/02/14 1510 05/02/14 1511  WBC 1.1*  --   HGB 9.8*  --   PLT 85*  --   CREATININE  --  1.0   Estimated Creatinine Clearance: 66.5 mL/min (by C-G formula based on Cr of 1). No results for input(s): VANCOTROUGH, VANCOPEAK, VANCORANDOM, GENTTROUGH, GENTPEAK, GENTRANDOM, TOBRATROUGH, TOBRAPEAK, TOBRARND, AMIKACINPEAK, AMIKACINTROU, AMIKACIN in the last 72 hours.   Microbiology: Recent Results (from the past 720 hour(s))  TECHNOLOGIST REVIEW     Status: None   Collection Time: 04/10/14  9:29 AM  Result Value Ref Range Status   Technologist Review Metas and Myelocytes present  Final  TECHNOLOGIST REVIEW     Status: None   Collection Time: 05/02/14  3:10 PM  Result Value Ref Range Status   Technologist Review Rare meta  Final    Medical History: Past Medical History  Diagnosis Date  . Hyperlipidemia   . Hypertension   . Ischemic heart disease   . Obesity     Medications:  Anti-infectives    Start     Dose/Rate Route Frequency Ordered Stop   05/03/14 0000  metroNIDAZOLE (FLAGYL) IVPB 500 mg     500 mg100 mL/hr over 60 Minutes Intravenous Every 8 hours 05/02/14 2356       Assessment: 68yo M w/ nausea, abdominal pain, generalized weakness, and neutropenia. Last received chemotherapy on 12/15-12/17. Denies feeling feverish and is afebrile in the ED. CT revealed sigmoid colon perforation and peritonitis. Flagyl started and pharmacy is  asked to dose Cefepime.  12/24 >> Flagyl >> 12/24 >> Cefepime >>  Tmax: AF WBCs: 1.1, ANC 0.9 Renal: SCr wnl, 67CG   Goal of Therapy:  Appropriate antibiotic dosing for renal function; eradication of infection  Plan:  Cefepime 2g IV q8h. Follow up clinical course, renal fxn, and any culture results.  Romeo Rabon, PharmD, pager [phone]. 05/03/2014,12:15 AM.

## 2014-05-03 NOTE — Progress Notes (Signed)
Utilization review completed.  

## 2014-05-03 NOTE — Progress Notes (Signed)
IP PROGRESS NOTE  Subjective:   Patient known to me with history of stage IV colon cancer with metastatic disease to the liver. He is status post 2 cycles of chemotherapy with FOLFIRI and Avastin. His last chemotherapy was on 04/24/2014. He presented with abdominal pain and dehydration and found to have symptoms of peritonitis. CT scan imaging confirmed the presence of colonic perforation and abscess formation. He is currently receiving IV hydration and intravenous antibiotics.  Clinically, he is about the same. His pain is controlled if he is not moving. He reports his abdominal pain is worse upon mobility and very tender to touch. He is not reporting any nausea or vomiting. Is not reporting any fevers.  Objective:  Vital signs in last 24 hours: Temp:  [97.7 F (36.5 C)-98.4 F (36.9 C)] 98.4 F (36.9 C) (12/24 0415) Pulse Rate:  [110-122] 122 (12/24 0415) Resp:  [18-19] 19 (12/24 0415) BP: (118-151)/(68-73) 129/71 mmHg (12/24 0415) SpO2:  [96 %-100 %] 97 % (12/24 0415) Weight:  [173 lb 6 oz (78.642 kg)-175 lb 7.8 oz (79.6 kg)] 175 lb 7.8 oz (79.6 kg) (12/24 0415) Weight change:  Last BM Date: 05/02/14  Intake/Output from previous day: 12/23 0701 - 12/24 0700 In: 160 [I.V.:10; IV Piggyback:150] Out: 350 [Urine:350]  Chronically ill-appearing gentleman did not appear in any distress. Mouth: mucous membranes moist, pharynx normal without lesions Resp: clear to auscultation bilaterally Cardio: regular rate and rhythm, S1, S2 normal, no murmur, click, rub or gallop GI: Soft and very tender to touch. With rebound and guarding. Hyperactive bowel sounds. Extremities: extremities normal, atraumatic, no cyanosis or edema  Portacath/PICC-without erythema  Lab Results:  Recent Labs  05/02/14 1510 05/03/14 0033  WBC 1.1* 1.1*  HGB 9.8* 9.5*  HCT 30.7* 30.0*  PLT 85* 84*    BMET  Recent Labs  05/02/14 1511 05/03/14 0033  NA 136 135  K 4.0 3.8  CL  --  106  CO2 22 23   GLUCOSE 145* 117*  BUN 23.7 25*  CREATININE 1.0 1.04  CALCIUM 8.4 8.0*    Studies/Results: Ct Head Wo Contrast  05/02/2014   CLINICAL DATA:  Increased confusion today with nausea and vomiting  EXAM: CT HEAD WITHOUT CONTRAST  TECHNIQUE: Contiguous axial images were obtained from the base of the skull through the vertex without intravenous contrast.  COMPARISON:  None  FINDINGS: Advanced atrophy with prominence of the bifrontal extra-axial spaces. Gray-white differentiation is maintained. No CT evidence of acute large territory infarct. No intraparenchymal extra-axial mass or hemorrhage. Unchanged size and configuration of the ventricles and basilar cisterns. No midline shift.  Limited visualization of the paranasal sinuses and mastoid air cells are normal. No air-fluid level. There is potential enlargement of the sella (image 9, series 3).  Regional soft tissues appear normal. Paranasal sinuses and mastoid air cells are normal. No displaced calvarial fracture.  IMPRESSION: 1. Advanced atrophy without acute intracranial process. 2. Nonspecific potential enlargement of the sella. Further evaluation could be performed with nonemergent brain MRI as clinically indicated.   Electronically Signed   By: [name] M.D.   On: 05/02/2014 21:51   Ct Abdomen Pelvis W Contrast  05/02/2014   CLINICAL DATA:  Mid abdominal pain, nausea and vomiting, after chemotherapy. History of colon cancer.  EXAM: CT ABDOMEN AND PELVIS WITH CONTRAST  TECHNIQUE: Multidetector CT imaging of the abdomen and pelvis was performed using the standard protocol following bolus administration of intravenous contrast.  CONTRAST:  113mL OMNIPAQUE IOHEXOL 300 MG/ML  SOLN  COMPARISON:  PET-CT 03/07/2014, CT abdomen/ pelvis 02/22/2014  FINDINGS: Evidence of CABG partly visualized. Trace pleural effusions are noted with associated bibasilar linear presumed atelectasis.  A few foci of free intraperitoneal air are identified image 19. Epicardial  nodes are identified, 0.6 cm diameter image 12.  Innumerable hepatic masses are reidentified with overall nodular liver contour suggesting underlying cirrhosis. Representative posterior segment right hepatic lobe mass is slightly smaller, now 5.4 x 4.7 cm image 22, previously 6.8 x 5.8 cm.  Porta hepatis node is stable measuring 0.9 cm image 30. Stable subcentimeter periaortic lymph nodes.  There are numerous loops of small bowel with diameter at upper limits of normal, representative 3.1 cm diameter loop of bowel within the left upper quadrant image 32, but which demonstrate abnormal wall thickening. Within the colon, the previously seen area of masslike sigmoid wall thickening is reidentified, image 71. There is a new rim enhancing gas/ fluid collection compatible with abscess from colonic perforation image 70, measuring 3.5 x 3.3 cm. Foci of gas are noted tracking throughout the mesentery as well. Large volume ascites is noted with interval development of rim enhancement suggesting superimposed infection, for example image 72. Pericolonic nodes are reidentified, representative 0.6 cm node image 64. There is increased pericolonic stranding. Increased stranding and fluid infiltration of the mesentery/omentum is noted without measurable nodularity.  Aortic atheromatous calcification without aneurysm. Fat containing left inguinal hernia noted. The bladder is unremarkable.  Evidence of right L5 laminectomy. Multilevel lumbar spine disc degenerative change noted. No focal osseous lesion is identified. The bones are subjectively mildly sclerotic, unchanged, raising the question of possible metastatic infiltration although this could be a normal variant.  IMPRESSION: Interval sigmoid colonic perforation adjacent to the previously seen masslike wall thickening compatible with primary colonic carcinoma and intra-abdominal abscess formation.  Small bowel wall thickening may suggest secondary enteritis. No evidence for small  bowel obstruction at this time.  Diffusely rim enhancing large volume ascites and mesenteric infiltration suggesting peritonitis.  Decrease in size of hepatic representative metastatic lesion.  Critical Value/emergent results were called by telephone at the time of interpretation on 05/02/2014 at 11:38 pm to [facility] for Dr. Berle Mull , who verbally acknowledged these results.   Electronically Signed   By: Conchita Paris M.D.   On: 05/02/2014 23:40    Medications: I have reviewed the patient's current medications.  Assessment/Plan:  68 year old gentleman with the following issues:  1. 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 He received 2 cycles of chemotherapy with some decrease in his hepatic metastasis as evident by the CT scan noted on 05/02/2014. Despite this response, he does have an incurable malignancy and certainly carries a poor prognosis. Chemotherapy is on hold for the time being.  He is currently neutropenic and receiving broad-spectrum antibiotics. I certainly agree with the current management.  2. Colonic perforation and peritonitis. He is currently on broad-spectrum antibiotics and Dr. Hassell Done form general surgery evaluated the patient. He felt surgery my office for their little palliation of symptoms especially with his diffuse abdominal metastasis and peritonitis. If he elected to proceed with surgery, he will end up with permanent colostomy.  3. Prognosis: This was discussed with the patient and his wife extensively. They understand that he has an incurable cancer and even if he survives this episode, he has limited life expectancy because of his malignancy. However, he was to proceed with a more aggressive approach and proceed with surgery and if  he recovers well we can consider restarting systemic chemotherapy. Alternatively, if he wants to proceed with comfort care only it would be reasonable as well.  I agree with DO NOT RESUSCITATE status  and he would benefit from palliative medicine evaluation to help him in making these decisions.   LOS: 1 day   [facility] 05/03/2014, 8:13 AM

## 2014-05-03 NOTE — Progress Notes (Addendum)
See Dr. Earlie Server note/consult -   He appears to have a perforated sigmoid colon cancer ... Probably has been perforated for several days.  He also has advanced metastatic colon cancer.  He lives with girlfriend (x 10 years), [name] (C: [phone]) in room.  Son [name] [place] - H: [phone], C: [phone] is not here yet.      (he has a daughter, [name], who is not involved)  I spoke to Dr. Jerilynn Mages by phone.  Dr. Benay Spice is on call today for oncology.  I spoke to Dr. Barton Dubois about the plans.  Plan:  Have palliative care see patient. (Dr. Jerilynn Mages will arrange this)  Options are surgery -   Resection of perforated colon with colostomy (the patient is leukopenic, thrombocytopenic, elevated PT, and malnourished - he will not do well with surgery) and his long term prognosis if he survives surgery is probably only months  Vs  Palliative care  I will be in the OR operating, but will check back with the patient/family after he is seen by palliative care.  Addendum: He was seen by Palliative Care, Dr. Suann Larry.  His son, [name] (and his wife), his daughter, [name], and long time girlfriend, [name], and friend, Eb, are in room.  They want to go ahead with surgery.  I have spent better than an hour with patient and family today reviewing options and the risks of surgery.  His long term prognosis is terrible.  He knows he will have a colostomy, he will have an open wound, be in the ICU post op, and then go from there.  Alphonsa Overall, MD, [facility] Surgery Pager: [phone] Office phone:  [phone]

## 2014-05-04 DIAGNOSIS — T451X5A Adverse effect of antineoplastic and immunosuppressive drugs, initial encounter: Secondary | ICD-10-CM

## 2014-05-04 DIAGNOSIS — K631 Perforation of intestine (nontraumatic): Secondary | ICD-10-CM

## 2014-05-04 DIAGNOSIS — D61810 Antineoplastic chemotherapy induced pancytopenia: Secondary | ICD-10-CM

## 2014-05-04 LAB — PREPARE FRESH FROZEN PLASMA
Unit division: 0
Unit division: 0
Unit division: 0

## 2014-05-04 LAB — CBC WITH DIFFERENTIAL/PLATELET
BASOS ABS: 0 10*3/uL (ref 0.0–0.1)
Basophils Relative: 0 % (ref 0–1)
Eosinophils Absolute: 0 10*3/uL (ref 0.0–0.7)
Eosinophils Relative: 0 % (ref 0–5)
HEMATOCRIT: 28.4 % — AB (ref 39.0–52.0)
HEMOGLOBIN: 9.3 g/dL — AB (ref 13.0–17.0)
LYMPHS ABS: 0.2 10*3/uL — AB (ref 0.7–4.0)
Lymphocytes Relative: 8 % — ABNORMAL LOW (ref 12–46)
MCH: 28 pg (ref 26.0–34.0)
MCHC: 32.7 g/dL (ref 30.0–36.0)
MCV: 85.5 fL (ref 78.0–100.0)
MONO ABS: 0.5 10*3/uL (ref 0.1–1.0)
Monocytes Relative: 27 % — ABNORMAL HIGH (ref 3–12)
NEUTROS ABS: 1.2 10*3/uL — AB (ref 1.7–7.7)
Neutrophils Relative %: 65 % (ref 43–77)
Platelets: 128 10*3/uL — ABNORMAL LOW (ref 150–400)
RBC: 3.32 MIL/uL — AB (ref 4.22–5.81)
RDW: 15.8 % — AB (ref 11.5–15.5)
WBC: 1.9 10*3/uL — AB (ref 4.0–10.5)

## 2014-05-04 LAB — BASIC METABOLIC PANEL
ANION GAP: 8 (ref 5–15)
BUN: 33 mg/dL — ABNORMAL HIGH (ref 6–23)
CHLORIDE: 108 meq/L (ref 96–112)
CO2: 20 mmol/L (ref 19–32)
Calcium: 7.6 mg/dL — ABNORMAL LOW (ref 8.4–10.5)
Creatinine, Ser: 1.2 mg/dL (ref 0.50–1.35)
GFR calc Af Amer: 69 mL/min — ABNORMAL LOW (ref >90)
GFR calc non Af Amer: 60 mL/min — ABNORMAL LOW (ref >90)
GLUCOSE: 267 mg/dL — AB (ref 70–99)
Potassium: 4 mmol/L (ref 3.5–5.1)
Sodium: 136 mmol/L (ref 135–145)

## 2014-05-04 LAB — URINE CULTURE
COLONY COUNT: NO GROWTH
Culture: NO GROWTH

## 2014-05-04 MED ORDER — SODIUM CHLORIDE 0.9 % IV BOLUS (SEPSIS)
500.0000 mL | Freq: Once | INTRAVENOUS | Status: AC
  Administered 2014-05-04: 500 mL via INTRAVENOUS

## 2014-05-04 MED ORDER — SODIUM CHLORIDE 0.9 % IV SOLN
Freq: Once | INTRAVENOUS | Status: AC
Start: 2014-05-04 — End: 2014-05-05
  Administered 2014-05-04 (×2): via INTRAVENOUS

## 2014-05-04 MED ORDER — MORPHINE SULFATE 2 MG/ML IJ SOLN
1.0000 mg | INTRAMUSCULAR | Status: DC | PRN
Start: 2014-05-04 — End: 2014-05-07
  Administered 2014-05-04: 4 mg via INTRAVENOUS
  Administered 2014-05-04: 2 mg via INTRAVENOUS
  Administered 2014-05-04: 1 mg via INTRAVENOUS
  Administered 2014-05-04 – 2014-05-05 (×4): 2 mg via INTRAVENOUS
  Administered 2014-05-05: 1 mg via INTRAVENOUS
  Administered 2014-05-05 – 2014-05-07 (×8): 2 mg via INTRAVENOUS
  Filled 2014-05-04 (×14): qty 1
  Filled 2014-05-04: qty 2
  Filled 2014-05-04 (×4): qty 1

## 2014-05-04 MED ORDER — METOPROLOL TARTRATE 1 MG/ML IV SOLN
2.5000 mg | Freq: Four times a day (QID) | INTRAVENOUS | Status: DC
  Administered 2014-05-04 – 2014-05-06 (×9): 2.5 mg via INTRAVENOUS
  Filled 2014-05-04 (×9): qty 5

## 2014-05-04 MED ORDER — SODIUM CHLORIDE 0.9 % IV SOLN
Freq: Once | INTRAVENOUS | Status: AC
  Administered 2014-05-04: 09:00:00 via INTRAVENOUS

## 2014-05-04 NOTE — Plan of Care (Signed)
Problem: Phase I Progression Outcomes Goal: OOB as tolerated unless otherwise ordered Outcome: Progressing OOB to chair.  Pt has been active prior to surgery. Goal: Hemodynamically stable Outcome: Progressing Initial CVP this am was -2, received orders for Fluid as ordered.

## 2014-05-04 NOTE — Progress Notes (Signed)
Kathline Magic called with [facility] regarding patients urine output and heart rate/blood pressure. Urine output since arrival on floor 8 hours ago is around 21cc/hr. Verbal Order for 500cc Normal Saline bolus given. Pts heart rate has been consistently over 120 for the last hour. As patient becomes more awake, his heart rate stays high. Rogue Bussing gave verbal order to go ahead and give 0600 metoprolol 2.5mg  dose early. Pt is stable. Will continue to monitor.

## 2014-05-04 NOTE — Plan of Care (Signed)
Comments:

## 2014-05-04 NOTE — Progress Notes (Signed)
[facility] PROGRESS NOTE  Frank [name] IPJ:[id number] DOB: September 06, 1945 DOA: 05/02/2014 PCP: [name], MD  Assessment/Plan: 1-abd pain, nausea, vomiting: due to bowel perforation and peritonitis -continue PRN antiemetics and analgesics -continue IV antibiotics -s/p hemicolectomy with colostomy  -follow general surgery rec's -remains NPO state  2-tachycardia: due to dehydration, blood loss and pain -will use low dose IV metoprolol (to prevent rebound) -will monitor  3-pancytopenia: due to ongoing chemotherapy -will avoid heparin products for now; will use SCD's -follow blood ceel count and transfuse as needed -oncology on board and will follow any specific rec's -loss almost 1000ML of blood during surgery, received 2 units PRBC's -current Hgb 9.3 (goal given hx of CAD and CABG is hgb > 8)  4-chronic diastolic heart failure -compensated at this point -will hold lasix at this moment -daily weights and strict intake/output -continue low dose b-blocker ordered  5-essential HTN: overall stable -will follow VS -continue metoprolol IV   6-GI prophylaxis: continue PPI  7-hx of CAD: s/p CABG in the past -no CP or SOB. -continue B-blocker -will monitor.  8-metastatic colon cancer: per oncology service.  Code Status: DNR Family Communication: wife, son, daughter in law at bedside Disposition Plan: to be determine    Consultants:  Oncology  General surgery (Dr. Lucia Gaskins)  Palliative Care  Procedures:  See below for x-ray reports  Abdominal surgery with hemicolectomy and colostomy 12/24  Antibiotics:  Cefepime and flagyl 12/23  HPI/Subjective: Afebrile; minimal pain in his abdomen. No CP, no SOB.   Objective: Filed Vitals:   05/04/14 0800  BP: 159/77  Pulse: 117  Temp:   Resp: 24    Intake/Output Summary (Last 24 hours) at 05/04/14 0848 Last data filed at 05/04/14 0820  Gross per 24 hour  Intake 7953.5 ml  Output   1080 ml  Net 6873.5  ml   Filed Weights   05/03/14 0415 05/03/14 2000 05/04/14 0500  Weight: 79.6 kg (175 lb 7.8 oz) 80.9 kg (178 lb 5.6 oz) 84.2 kg (185 lb 10 oz)    Exam:   General:  Denies nausea or vomiting; no CP and no SOB. Afebrile. Looking better and no complaining of that much pain in his belly. Alert, oriented X3 and ale to follow commands.  Cardiovascular: tachycardia, no rubs or gallops; no JVD; trace edema LE   Respiratory: CTA bilaterally  Abdomen: mild tender to palpation, absent BS; colostomy bag in place, stool inside bag (positive serosanguineous drainage)   Musculoskeletal: no cyanosis   Data Reviewed: Basic Metabolic Panel:  Recent Labs Lab 05/02/14 1511 05/03/14 0033 05/03/14 2105 05/04/14 0510  NA 136 135 139 136  K 4.0 3.8 4.3 4.0  CL  --  106 110 108  CO2 22 23 23 20   GLUCOSE 145* 117* 155* 267*  BUN 23.7 25* 29* 33*  CREATININE 1.0 1.04 1.02 1.20  CALCIUM 8.4 8.0* 7.8* 7.6*   Liver Function Tests:  Recent Labs Lab 05/02/14 1511 05/03/14 0033  AST 9 11  ALT 9 9  ALKPHOS 104 139*  BILITOT 0.79 1.0  PROT 5.6* 5.5*  ALBUMIN 2.0* 2.2*    Recent Labs Lab 05/03/14 0130  LIPASE <10*    Recent Labs Lab 05/03/14 0130  AMMONIA 17   CBC:  Recent Labs Lab 05/02/14 1510 05/03/14 0033 05/03/14 2105 05/04/14 0510  WBC 1.1* 1.1* 2.3* 1.9*  NEUTROABS 0.9* 0.8* 1.6* 1.2*  HGB 9.8* 9.5* 10.3* 9.3*  HCT 30.7* 30.0* 32.1* 28.4*  MCV 84.1 85.7 86.5 85.5  PLT 85* 84* 133* 128*   CBG:  Recent Labs Lab 05/02/14 2120 05/03/14 0723 05/03/14 1955  GLUCAP 106* 103* 147*    Recent Results (from the past 240 hour(s))  TECHNOLOGIST REVIEW     Status: None   Collection Time: 05/02/14  3:10 PM  Result Value Ref Range Status   Technologist Review Rare meta  Final  MRSA PCR Screening     Status: None   Collection Time: 05/03/14  8:27 PM  Result Value Ref Range Status   MRSA by PCR NEGATIVE NEGATIVE Final    Comment:        The GeneXpert MRSA Assay  (FDA approved for NASAL specimens only), is one component of a comprehensive MRSA colonization surveillance program. It is not intended to diagnose MRSA infection nor to guide or monitor treatment for MRSA infections.      Studies: Ct Head Wo Contrast  05/02/2014   CLINICAL DATA:  Increased confusion today with nausea and vomiting  EXAM: CT HEAD WITHOUT CONTRAST  TECHNIQUE: Contiguous axial images were obtained from the base of the skull through the vertex without intravenous contrast.  COMPARISON:  None  FINDINGS: Advanced atrophy with prominence of the bifrontal extra-axial spaces. Gray-white differentiation is maintained. No CT evidence of acute large territory infarct. No intraparenchymal extra-axial mass or hemorrhage. Unchanged size and configuration of the ventricles and basilar cisterns. No midline shift.  Limited visualization of the paranasal sinuses and mastoid air cells are normal. No air-fluid level. There is potential enlargement of the sella (image 9, series 3).  Regional soft tissues appear normal. Paranasal sinuses and mastoid air cells are normal. No displaced calvarial fracture.  IMPRESSION: 1. Advanced atrophy without acute intracranial process. 2. Nonspecific potential enlargement of the sella. Further evaluation could be performed with nonemergent brain MRI as clinically indicated.   Electronically Signed   By: [name] M.D.   On: 05/02/2014 21:51   Ct Abdomen Pelvis W Contrast  05/02/2014   CLINICAL DATA:  Mid abdominal pain, nausea and vomiting, after chemotherapy. History of colon cancer.  EXAM: CT ABDOMEN AND PELVIS WITH CONTRAST  TECHNIQUE: Multidetector CT imaging of the abdomen and pelvis was performed using the standard protocol following bolus administration of intravenous contrast.  CONTRAST:  110mL OMNIPAQUE IOHEXOL 300 MG/ML  SOLN  COMPARISON:  PET-CT 03/07/2014, CT abdomen/ pelvis 02/22/2014  FINDINGS: Evidence of CABG partly visualized. Trace pleural  effusions are noted with associated bibasilar linear presumed atelectasis.  A few foci of free intraperitoneal air are identified image 19. Epicardial nodes are identified, 0.6 cm diameter image 12.  Innumerable hepatic masses are reidentified with overall nodular liver contour suggesting underlying cirrhosis. Representative posterior segment right hepatic lobe mass is slightly smaller, now 5.4 x 4.7 cm image 22, previously 6.8 x 5.8 cm.  Porta hepatis node is stable measuring 0.9 cm image 30. Stable subcentimeter periaortic lymph nodes.  There are numerous loops of small bowel with diameter at upper limits of normal, representative 3.1 cm diameter loop of bowel within the left upper quadrant image 32, but which demonstrate abnormal wall thickening. Within the colon, the previously seen area of masslike sigmoid wall thickening is reidentified, image 71. There is a new rim enhancing gas/ fluid collection compatible with abscess from colonic perforation image 70, measuring 3.5 x 3.3 cm. Foci of gas are noted tracking throughout the mesentery as well. Large volume ascites is noted with interval development of rim enhancement suggesting superimposed infection, for example image 72. Pericolonic nodes  are reidentified, representative 0.6 cm node image 64. There is increased pericolonic stranding. Increased stranding and fluid infiltration of the mesentery/omentum is noted without measurable nodularity.  Aortic atheromatous calcification without aneurysm. Fat containing left inguinal hernia noted. The bladder is unremarkable.  Evidence of right L5 laminectomy. Multilevel lumbar spine disc degenerative change noted. No focal osseous lesion is identified. The bones are subjectively mildly sclerotic, unchanged, raising the question of possible metastatic infiltration although this could be a normal variant.  IMPRESSION: Interval sigmoid colonic perforation adjacent to the previously seen masslike wall thickening compatible  with primary colonic carcinoma and intra-abdominal abscess formation.  Small bowel wall thickening may suggest secondary enteritis. No evidence for small bowel obstruction at this time.  Diffusely rim enhancing large volume ascites and mesenteric infiltration suggesting peritonitis.  Decrease in size of hepatic representative metastatic lesion.  Critical Value/emergent results were called by telephone at the time of interpretation on 05/02/2014 at 11:38 pm to [facility] for Dr. Berle Mull , who verbally acknowledged these results.   Electronically Signed   By: Conchita Paris M.D.   On: 05/02/2014 23:40   Dg Chest Port 1 View  05/03/2014   CLINICAL DATA:  Central line placement.  EXAM: PORTABLE CHEST - 1 VIEW  COMPARISON:  02/22/2014  FINDINGS: Sternotomy wires are present. Nasogastric tube has tip and side-port over the stomach in the left upper quadrant. Left IJ central venous catheter has tip in the region of the cavoatrial junction. Right IJ central venous catheter has tip over the region of the SVC.  Lungs are hypoinflated with mild linear density in the left base likely atelectasis. There is subtle density in the medial right base likely atelectasis. No definite effusion or pneumothorax. Cardiomediastinal silhouette and remainder of the exam is unchanged.  IMPRESSION: Hypoinflation with mild bibasilar opacification likely atelectasis.  Tubes and lines as described.  No definite pneumothorax.   Electronically Signed   By: Marin Olp M.D.   On: 05/03/2014 18:53    Scheduled Meds: . sodium chloride   Intravenous Once  . sodium chloride   Intravenous Once  . sodium chloride  10 mL/hr Intravenous Once  . antiseptic oral rinse  7 mL Mouth Rinse BID  . ceFEPime (MAXIPIME) IV  2 g Intravenous Q8H  . metoprolol  2.5 mg Intravenous 4 times per day  . metronidazole  500 mg Intravenous Q8H  . morphine   Intravenous 6 times per day  . pantoprazole (PROTONIX) IV  40 mg Intravenous Q24H  . sodium chloride   3 mL Intravenous Q12H   Continuous Infusions: . dextrose 5 % and 0.45 % NaCl with KCl 20 mEq/L 150 mL/hr at 05/04/14 3790    Principal Problem:   Perforated bowel Active Problems:   Liver masses   Malignant neoplasm of colon   Abdominal pain   Dehydration   Mucositis due to antineoplastic therapy   Intractable nausea and vomiting   Acute encephalopathy   Palliative care encounter    Time spent: 35 minutes (more than 50% of time dedicated to face to face examination, discussion and care coordination)    Barton Dubois  [facility] Pager [phone]. If 7PM-7AM, please contact night-coverage at [email or web address], password [facility] 05/04/2014, 8:48 AM  LOS: 2 days

## 2014-05-04 NOTE — Progress Notes (Signed)
General Surgery Note  LOS: 2 days  POD -  1 Day Post-Op  Assessment/Plan: 1.  SIGMOID AND LEFT COLON COLECTOMY, END COLOSTOMY, MOBILIZATION OF SPLENIC FLEXURE - 05/03/2014 - D. Furqan Gosselin  For perforated sigmoid colon at site of colon cancer  To remove NGT  Behind in volume - to start CVP's and give boluses.  Labs ordered for tomorrow.  To get OOB  1.  Open wound - clean - to start dressing changes  2.  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 Wanda Plump. [name] 3.  History of CAD  Followed by Dr. Vaughan Browner - coronary artery bypass graft surgery in 2001. His last Myoview was 03/12/05 and at that time he had an ejection fraction of 50%. 4.  Leukopenia - WBC 1,900 - 05/04/2014 5.  Malnutrition  Will start po's at earliest convenience 6.  DVT prophylaxis - chemoprophylaxis on hold for thrombocytopenia, coagulopathy, and anemia 7.  HTN 8.  Anemia - Hgb - 9.3 - 05/04/2014  He received 2 units of blood during the surgery   Principal Problem:   Perforated bowel Active Problems:   Liver masses   Malignant neoplasm of colon   Abdominal pain   Dehydration   Mucositis due to antineoplastic therapy   Intractable nausea and vomiting   Acute encephalopathy   Palliative care encounter   Subjective:  Alert.  Not too much pain.  A little confused. Objective:   Filed Vitals:   05/04/14 0800  BP: 159/77  Pulse: 117  Temp:   Resp: 24     Intake/Output from previous day:  12/24 0701 - 12/25 0700 In: 7603.5 [I.V.:5942.5; Blood:1511; IV Piggyback:150] Out: 1050 [Urine:530; Emesis/NG output:170; Blood:350]  Intake/Output this shift:  Total I/O In: 350 [I.V.:300; IV Piggyback:50] Out: 30 [Urine:30]   Physical Exam:   General: WM who is alert.   HEENT: Normal. Pupils equal. .   Lungs: Clear.   Abdomen: Soft.  Quiet.   Wound: Clean.  Ostomy LUQ looks okay.   Lab Results:    Recent Labs  05/03/14 2105  05/04/14 0510  WBC 2.3* 1.9*  HGB 10.3* 9.3*  HCT 32.1* 28.4*  PLT 133* 128*    BMET   Recent Labs  05/03/14 2105 05/04/14 0510  NA 139 136  K 4.3 4.0  CL 110 108  CO2 23 20  GLUCOSE 155* 267*  BUN 29* 33*  CREATININE 1.02 1.20  CALCIUM 7.8* 7.6*    PT/INR   Recent Labs  05/03/14 0130  LABPROT 19.4*  INR 1.62*    ABG  No results for input(s): PHART, HCO3 in the last 72 hours.  Invalid input(s): PCO2, PO2   Studies/Results:  Ct Head Wo Contrast  05/02/2014   CLINICAL DATA:  Increased confusion today with nausea and vomiting  EXAM: CT HEAD WITHOUT CONTRAST  TECHNIQUE: Contiguous axial images were obtained from the base of the skull through the vertex without intravenous contrast.  COMPARISON:  None  FINDINGS: Advanced atrophy with prominence of the bifrontal extra-axial spaces. Gray-white differentiation is maintained. No CT evidence of acute large territory infarct. No intraparenchymal extra-axial mass or hemorrhage. Unchanged size and configuration of the ventricles and basilar cisterns. No midline shift.  Limited visualization of the paranasal sinuses and mastoid air cells are normal. No air-fluid level. There is potential enlargement of the sella (image 9, series 3).  Regional soft tissues appear normal. Paranasal sinuses and mastoid air  cells are normal. No displaced calvarial fracture.  IMPRESSION: 1. Advanced atrophy without acute intracranial process. 2. Nonspecific potential enlargement of the sella. Further evaluation could be performed with nonemergent brain MRI as clinically indicated.   Electronically Signed   By: [name] M.D.   On: 05/02/2014 21:51   Ct Abdomen Pelvis W Contrast  05/02/2014   CLINICAL DATA:  Mid abdominal pain, nausea and vomiting, after chemotherapy. History of colon cancer.  EXAM: CT ABDOMEN AND PELVIS WITH CONTRAST  TECHNIQUE: Multidetector CT imaging of the abdomen and pelvis was performed using the standard protocol following bolus  administration of intravenous contrast.  CONTRAST:  140mL OMNIPAQUE IOHEXOL 300 MG/ML  SOLN  COMPARISON:  PET-CT 03/07/2014, CT abdomen/ pelvis 02/22/2014  FINDINGS: Evidence of CABG partly visualized. Trace pleural effusions are noted with associated bibasilar linear presumed atelectasis.  A few foci of free intraperitoneal air are identified image 19. Epicardial nodes are identified, 0.6 cm diameter image 12.  Innumerable hepatic masses are reidentified with overall nodular liver contour suggesting underlying cirrhosis. Representative posterior segment right hepatic lobe mass is slightly smaller, now 5.4 x 4.7 cm image 22, previously 6.8 x 5.8 cm.  Porta hepatis node is stable measuring 0.9 cm image 30. Stable subcentimeter periaortic lymph nodes.  There are numerous loops of small bowel with diameter at upper limits of normal, representative 3.1 cm diameter loop of bowel within the left upper quadrant image 32, but which demonstrate abnormal wall thickening. Within the colon, the previously seen area of masslike sigmoid wall thickening is reidentified, image 71. There is a new rim enhancing gas/ fluid collection compatible with abscess from colonic perforation image 70, measuring 3.5 x 3.3 cm. Foci of gas are noted tracking throughout the mesentery as well. Large volume ascites is noted with interval development of rim enhancement suggesting superimposed infection, for example image 72. Pericolonic nodes are reidentified, representative 0.6 cm node image 64. There is increased pericolonic stranding. Increased stranding and fluid infiltration of the mesentery/omentum is noted without measurable nodularity.  Aortic atheromatous calcification without aneurysm. Fat containing left inguinal hernia noted. The bladder is unremarkable.  Evidence of right L5 laminectomy. Multilevel lumbar spine disc degenerative change noted. No focal osseous lesion is identified. The bones are subjectively mildly sclerotic, unchanged,  raising the question of possible metastatic infiltration although this could be a normal variant.  IMPRESSION: Interval sigmoid colonic perforation adjacent to the previously seen masslike wall thickening compatible with primary colonic carcinoma and intra-abdominal abscess formation.  Small bowel wall thickening may suggest secondary enteritis. No evidence for small bowel obstruction at this time.  Diffusely rim enhancing large volume ascites and mesenteric infiltration suggesting peritonitis.  Decrease in size of hepatic representative metastatic lesion.  Critical Value/emergent results were called by telephone at the time of interpretation on 05/02/2014 at 11:38 pm to [facility] for Dr. Berle Mull , who verbally acknowledged these results.   Electronically Signed   By: Conchita Paris M.D.   On: 05/02/2014 23:40   Dg Chest Port 1 View  05/03/2014   CLINICAL DATA:  Central line placement.  EXAM: PORTABLE CHEST - 1 VIEW  COMPARISON:  02/22/2014  FINDINGS: Sternotomy wires are present. Nasogastric tube has tip and side-port over the stomach in the left upper quadrant. Left IJ central venous catheter has tip in the region of the cavoatrial junction. Right IJ central venous catheter has tip over the region of the SVC.  Lungs are hypoinflated with mild linear density in the left  base likely atelectasis. There is subtle density in the medial right base likely atelectasis. No definite effusion or pneumothorax. Cardiomediastinal silhouette and remainder of the exam is unchanged.  IMPRESSION: Hypoinflation with mild bibasilar opacification likely atelectasis.  Tubes and lines as described.  No definite pneumothorax.   Electronically Signed   By: Marin Olp M.D.   On: 05/03/2014 18:53     Anti-infectives:   Anti-infectives    Start     Dose/Rate Route Frequency Ordered Stop   05/03/14 0200  metroNIDAZOLE (FLAGYL) IVPB 500 mg  Status:  Discontinued     500 mg100 mL/hr over 60 Minutes Intravenous Every 8 hours  05/03/14 0018 05/03/14 0018   05/03/14 0100  ciprofloxacin (CIPRO) IVPB 400 mg  Status:  Discontinued     400 mg200 mL/hr over 60 Minutes Intravenous Every 12 hours 05/03/14 0016 05/03/14 0018   05/03/14 0100  metroNIDAZOLE (FLAGYL) IVPB 500 mg     500 mg100 mL/hr over 60 Minutes Intravenous Every 8 hours 05/03/14 0021     05/03/14 0100  ceFEPIme (MAXIPIME) 2 g in dextrose 5 % 50 mL IVPB     2 g100 mL/hr over 30 Minutes Intravenous Every 8 hours 05/03/14 0031     05/03/14 0000  metroNIDAZOLE (FLAGYL) IVPB 500 mg  Status:  Discontinued     500 mg100 mL/hr over 60 Minutes Intravenous Every 8 hours 05/02/14 2356 05/03/14 0018      Alphonsa Overall, MD, FACS Pager: [place] [facility] Office: [phone] 05/04/2014

## 2014-05-05 LAB — MAGNESIUM: Magnesium: 1.7 mg/dL (ref 1.5–2.5)

## 2014-05-05 LAB — CBC
HEMATOCRIT: 24.1 % — AB (ref 39.0–52.0)
HEMOGLOBIN: 8 g/dL — AB (ref 13.0–17.0)
MCH: 28.4 pg (ref 26.0–34.0)
MCHC: 33.2 g/dL (ref 30.0–36.0)
MCV: 85.5 fL (ref 78.0–100.0)
Platelets: 124 10*3/uL — ABNORMAL LOW (ref 150–400)
RBC: 2.82 MIL/uL — ABNORMAL LOW (ref 4.22–5.81)
RDW: 16.1 % — AB (ref 11.5–15.5)
WBC: 2.3 10*3/uL — AB (ref 4.0–10.5)

## 2014-05-05 LAB — BASIC METABOLIC PANEL
Anion gap: 3 — ABNORMAL LOW (ref 5–15)
BUN: 27 mg/dL — AB (ref 6–23)
CHLORIDE: 111 meq/L (ref 96–112)
CO2: 17 mmol/L — ABNORMAL LOW (ref 19–32)
CREATININE: 0.87 mg/dL (ref 0.50–1.35)
Calcium: 7.2 mg/dL — ABNORMAL LOW (ref 8.4–10.5)
GFR calc Af Amer: >90 mL/min (ref >90)
GFR calc non Af Amer: 85 mL/min — ABNORMAL LOW (ref >90)
GLUCOSE: 229 mg/dL — AB (ref 70–99)
Potassium: 4.1 mmol/L (ref 3.5–5.1)
Sodium: 131 mmol/L — ABNORMAL LOW (ref 135–145)

## 2014-05-05 MED ORDER — SODIUM CHLORIDE 0.9 % IJ SOLN
10.0000 mL | Freq: Two times a day (BID) | INTRAMUSCULAR | Status: DC
  Administered 2014-05-05 – 2014-05-20 (×16): 10 mL via INTRAVENOUS

## 2014-05-05 MED ORDER — SODIUM CHLORIDE 0.9 % IV SOLN
INTRAVENOUS | Status: DC
  Administered 2014-05-04: 1000 mL via INTRAVENOUS
  Administered 2014-05-11: 18:00:00 via INTRAVENOUS

## 2014-05-05 MED ORDER — CHLORHEXIDINE GLUCONATE 0.12 % MT SOLN
OROMUCOSAL | Status: AC
  Administered 2014-05-05: 15 mL
  Filled 2014-05-05: qty 15

## 2014-05-05 NOTE — Progress Notes (Signed)
Pt alert x4 CVP @ 1950 was 3. MD Wert made aware, no new orders given at this time will continue to monitor. Jeanie Sewer, RN 8:00 PM 05/05/2014

## 2014-05-05 NOTE — Progress Notes (Signed)
General Surgery Note  LOS: 3 days  POD -  2 Days Post-Op  Assessment/Plan: 1.  SIGMOID AND LEFT COLON COLECTOMY, END COLOSTOMY, MOBILIZATION OF SPLENIC FLEXURE - 05/03/2014 - D. Winda Summerall  For perforated sigmoid colon at site of colon cancer - final path pending  Leukopenia slowly improving.  CVP running 2 - 7.  Still slightly behind in fluid, but we have caught up a lot.  He got OOB twice yesterday.  Keep NPO for now.  1A.  Open wound - clean  2.  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 Wanda Plump. [name] 3.  History of CAD  Followed by Dr. Vaughan Browner - coronary artery bypass graft surgery in 2001. His last Myoview was 03/12/05 and at that time he had an ejection fraction of 50%. 4.  Leukopenia improving - WBC 2,300 - 05/05/2014 5.  Malnutrition  Will start po's at earliest convenience 6.  DVT prophylaxis - chemoprophylaxis on hold for thrombocytopenia, coagulopathy, and anemia 7.  HTN 8.  Anemia - Hgb - 8.0 - 05/05/2014  He received 2 units of blood during the surgery  No evidence of blood loss since surgery - I think decreased Hgb in part dilutional from significant fluid resuscitation and his underlying malnourished state.   Principal Problem:   Perforation of colon cancer s/p colectomy/colostomy 05/03/2014 Active Problems:   Liver masses   Malignant neoplasm of colon   Abdominal pain   Dehydration   Mucositis due to antineoplastic therapy   Intractable nausea and vomiting   Acute encephalopathy   Perforated bowel   Palliative care encounter   Subjective:  Alert.  Doing well.  No significant complaint. Objective:   Filed Vitals:   05/05/14 0600  BP:   Pulse: 72  Temp:   Resp: 24     Intake/Output from previous day:  12/25 0701 - 12/26 0700 In: 7130 [I.V.:5680; IV Piggyback:450] Out: 1045 [Urine:995; Stool:50]  Intake/Output this shift:      Physical Exam:   General: WM who is  alert.   HEENT: Normal. Pupils equal. .   Lungs: Clear.   Abdomen: Soft.  Quiet.   Wound: Clean.  Ostomy LUQ looks okay.   Lab Results:     Recent Labs  05/04/14 0510 05/05/14 0425  WBC 1.9* 2.3*  HGB 9.3* 8.0*  HCT 28.4* 24.1*  PLT 128* 124*    BMET    Recent Labs  05/04/14 0510 05/05/14 0425  NA 136 131*  K 4.0 4.1  CL 108 111  CO2 20 17*  GLUCOSE 267* 229*  BUN 33* 27*  CREATININE 1.20 0.87  CALCIUM 7.6* 7.2*    PT/INR    Recent Labs  05/03/14 0130  LABPROT 19.4*  INR 1.62*    ABG  No results for input(s): PHART, HCO3 in the last 72 hours.  Invalid input(s): PCO2, PO2   Studies/Results:  Dg Chest Port 1 View  05/03/2014   CLINICAL DATA:  Central line placement.  EXAM: PORTABLE CHEST - 1 VIEW  COMPARISON:  02/22/2014  FINDINGS: Sternotomy wires are present. Nasogastric tube has tip and side-port over the stomach in the left upper quadrant. Left IJ central venous catheter has tip in the region of the cavoatrial junction. Right IJ central venous catheter has tip over the region of the SVC.  Lungs are hypoinflated with mild linear density in the left base likely atelectasis. There is subtle density in the  medial right base likely atelectasis. No definite effusion or pneumothorax. Cardiomediastinal silhouette and remainder of the exam is unchanged.  IMPRESSION: Hypoinflation with mild bibasilar opacification likely atelectasis.  Tubes and lines as described.  No definite pneumothorax.   Electronically Signed   By: Marin Olp M.D.   On: 05/03/2014 18:53     Anti-infectives:   Anti-infectives    Start     Dose/Rate Route Frequency Ordered Stop   05/03/14 0200  metroNIDAZOLE (FLAGYL) IVPB 500 mg  Status:  Discontinued     500 mg100 mL/hr over 60 Minutes Intravenous Every 8 hours 05/03/14 0018 05/03/14 0018   05/03/14 0100  ciprofloxacin (CIPRO) IVPB 400 mg  Status:  Discontinued     400 mg200 mL/hr over 60 Minutes Intravenous Every 12 hours 05/03/14 0016  05/03/14 0018   05/03/14 0100  metroNIDAZOLE (FLAGYL) IVPB 500 mg     500 mg100 mL/hr over 60 Minutes Intravenous Every 8 hours 05/03/14 0021     05/03/14 0100  ceFEPIme (MAXIPIME) 2 g in dextrose 5 % 50 mL IVPB     2 g100 mL/hr over 30 Minutes Intravenous Every 8 hours 05/03/14 0031     05/03/14 0000  metroNIDAZOLE (FLAGYL) IVPB 500 mg  Status:  Discontinued     500 mg100 mL/hr over 60 Minutes Intravenous Every 8 hours 05/02/14 2356 05/03/14 0018      Alphonsa Overall, MD, FACS Pager: [place] [facility] Office: [phone] 05/05/2014

## 2014-05-05 NOTE — Progress Notes (Signed)
[facility] PROGRESS NOTE  FIELDS OROS IEP:[id number] DOB: [birth year] DOA: 05/02/2014 PCP: [name], MD  Assessment/Plan: 1-abd pain, nausea, vomiting: due to bowel perforation and peritonitis -continue PRN antiemetics and analgesics -continue IV antibiotics for now; no fever -s/p hemicolectomy with colostomy  -follow general surgery rec's -remains NPO state; NGT removed on 12/25 -continue supportive care, follow CVP's and maintain fluid resuscitation   2-tachycardia: due to dehydration, blood loss and pain -will continue IV metoprolol (to prevent rebound) -will monitor  3-pancytopenia: due to ongoing chemotherapy -will avoid heparin products for now; will use SCD's -follow blood ceel count and transfuse as needed -oncology on board and will follow any specific rec's -loss almost 1000ML of blood during surgery, received 2 units PRBC's -current Hgb 8.0 (goal given hx of CAD and CABG is hgb > 8); holding on transfusion today; most likely dilutional effect with aggressive fluid resuscitation  4-chronic diastolic heart failure -compensated at this point -will continue holding lasix -daily weights and strict intake/output -continue low dose b-blocker ordered  5-essential HTN: overall stable -will follow VS -continue metoprolol IV   6-GI prophylaxis: continue PPI  7-hx of CAD: s/p CABG in the past -no CP or SOB. -continue B-blocker -will monitor.  8-metastatic colon cancer: per oncology service.  Code Status: DNR Family Communication: wife, son, daughter in law at bedside Disposition Plan: to be determine    Consultants:  Oncology  General surgery (Dr. Lucia Gaskins)  Palliative Care  Procedures:  See below for x-ray reports  Abdominal surgery with hemicolectomy and colostomy 12/24  Antibiotics:  Cefepime and flagyl 12/23  HPI/Subjective: Afebrile; minimal pain in his abdomen. No CP, no SOB. Family reprots some confusion overnight, but clear  this morning. No nausea or vomiting.  Objective: Filed Vitals:   05/05/14 0800  BP: 165/76  Pulse: 77  Temp: 97.6 F (36.4 C)  Resp: 25    Intake/Output Summary (Last 24 hours) at 05/05/14 1203 Last data filed at 05/05/14 1048  Gross per 24 hour  Intake   7110 ml  Output   1190 ml  Net   5920 ml   Filed Weights   05/03/14 2000 05/04/14 0500 05/05/14 0400  Weight: 80.9 kg (178 lb 5.6 oz) 84.2 kg (185 lb 10 oz) 89.7 kg (197 lb 12 oz)    Exam:   General:  Denies nausea or vomiting; no CP and no SOB. Afebrile. Looking better and with minimal pain in his belly. Family reports some confusion overnight (clear this morning)  Cardiovascular: tachycardia, no rubs or gallops; no JVD; trace edema LE   Respiratory: CTA bilaterally  Abdomen: mild tender to palpation, absent BS; colostomy bag in place, stool inside bag (positive serosanguineous drainage)   Musculoskeletal: no cyanosis   Data Reviewed: Basic Metabolic Panel:  Recent Labs Lab 05/02/14 1511 05/03/14 0033 05/03/14 2105 05/04/14 0510 05/05/14 0425  NA 136 135 139 136 131*  K 4.0 3.8 4.3 4.0 4.1  CL  --  106 110 108 111  CO2 22 23 23 20  17*  GLUCOSE 145* 117* 155* 267* 229*  BUN 23.7 25* 29* 33* 27*  CREATININE 1.0 1.04 1.02 1.20 0.87  CALCIUM 8.4 8.0* 7.8* 7.6* 7.2*  MG  --   --   --   --  1.7   Liver Function Tests:  Recent Labs Lab 05/02/14 1511 05/03/14 0033  AST 9 11  ALT 9 9  ALKPHOS 104 139*  BILITOT 0.79 1.0  PROT 5.6* 5.5*  ALBUMIN 2.0* 2.2*  Recent Labs Lab 05/03/14 0130  LIPASE <10*    Recent Labs Lab 05/03/14 0130  AMMONIA 17   CBC:  Recent Labs Lab 05/02/14 1510 05/03/14 0033 05/03/14 2105 05/04/14 0510 05/05/14 0425  WBC 1.1* 1.1* 2.3* 1.9* 2.3*  NEUTROABS 0.9* 0.8* 1.6* 1.2*  --   HGB 9.8* 9.5* 10.3* 9.3* 8.0*  HCT 30.7* 30.0* 32.1* 28.4* 24.1*  MCV 84.1 85.7 86.5 85.5 85.5  PLT 85* 84* 133* 128* 124*   CBG:  Recent Labs Lab 05/02/14 2120 05/03/14 0723  05/03/14 1955  GLUCAP 106* 103* 147*    Recent Results (from the past 240 hour(s))  TECHNOLOGIST REVIEW     Status: None   Collection Time: 05/02/14  3:10 PM  Result Value Ref Range Status   Technologist Review Rare meta  Final  Urine culture     Status: None   Collection Time: 05/03/14 12:39 AM  Result Value Ref Range Status   Specimen Description URINE, RANDOM  Final   Special Requests NONE  Final   Colony Count NO GROWTH Performed at [facility]   Final   Culture NO GROWTH Performed at [facility]   Final   Report Status 05/04/2014 FINAL  Final  Anaerobic culture     Status: None (Preliminary result)   Collection Time: 05/03/14  3:26 PM  Result Value Ref Range Status   Specimen Description PERITONEAL  Final   Special Requests NONE  Final   Gram Stain   Final    NO WBC SEEN RARE GRAM NEGATIVE RODS Performed at [facility]    Culture   Final    NO ANAEROBES ISOLATED; CULTURE IN PROGRESS FOR 5 DAYS Performed at [facility]    Report Status PENDING  Incomplete  MRSA PCR Screening     Status: None   Collection Time: 05/03/14  8:27 PM  Result Value Ref Range Status   MRSA by PCR NEGATIVE NEGATIVE Final    Comment:        The GeneXpert MRSA Assay (FDA approved for NASAL specimens only), is one component of a comprehensive MRSA colonization surveillance program. It is not intended to diagnose MRSA infection nor to guide or monitor treatment for MRSA infections.      Studies: Dg Chest Port 1 View  05/03/2014   CLINICAL DATA:  Central line placement.  EXAM: PORTABLE CHEST - 1 VIEW  COMPARISON:  02/22/2014  FINDINGS: Sternotomy wires are present. Nasogastric tube has tip and side-port over the stomach in the left upper quadrant. Left IJ central venous catheter has tip in the region of the cavoatrial junction. Right IJ central venous catheter has tip over the region of the SVC.  Lungs are hypoinflated with mild linear density in the  left base likely atelectasis. There is subtle density in the medial right base likely atelectasis. No definite effusion or pneumothorax. Cardiomediastinal silhouette and remainder of the exam is unchanged.  IMPRESSION: Hypoinflation with mild bibasilar opacification likely atelectasis.  Tubes and lines as described.  No definite pneumothorax.   Electronically Signed   By: Marin Olp M.D.   On: 05/03/2014 18:53    Scheduled Meds: . sodium chloride   Intravenous Once  . sodium chloride   Intravenous Once  . sodium chloride  10 mL/hr Intravenous Once  . antiseptic oral rinse  7 mL Mouth Rinse BID  . ceFEPime (MAXIPIME) IV  2 g Intravenous Q8H  . metoprolol  2.5 mg Intravenous 4 times per day  .  metronidazole  500 mg Intravenous Q8H  . morphine   Intravenous 6 times per day  . pantoprazole (PROTONIX) IV  40 mg Intravenous Q24H  . sodium chloride  10 mL Intravenous Q12H   Continuous Infusions: . sodium chloride 1,000 mL (05/04/14 1800)  . dextrose 5 % and 0.45 % NaCl with KCl 20 mEq/L 250 mL/hr at 05/05/14 1048    Principal Problem:   Perforation of colon cancer s/p colectomy/colostomy 05/03/2014 Active Problems:   Liver masses   Malignant neoplasm of colon   Abdominal pain   Dehydration   Mucositis due to antineoplastic therapy   Intractable nausea and vomiting   Acute encephalopathy   Perforated bowel   Palliative care encounter    Time spent: 35 minutes (more than 50% of time dedicated to face to face examination, discussion and care coordination)   Barton Dubois  [facility] Pager [phone]. If 7PM-7AM, please contact night-coverage at [email or web address], password [facility] 05/05/2014, 12:03 PM  LOS: 3 days

## 2014-05-06 DIAGNOSIS — C189 Malignant neoplasm of colon, unspecified: Secondary | ICD-10-CM | POA: Insufficient documentation

## 2014-05-06 DIAGNOSIS — I10 Essential (primary) hypertension: Secondary | ICD-10-CM

## 2014-05-06 LAB — CBC
HEMATOCRIT: 27.4 % — AB (ref 39.0–52.0)
HEMOGLOBIN: 9.1 g/dL — AB (ref 13.0–17.0)
MCH: 27.8 pg (ref 26.0–34.0)
MCHC: 33.2 g/dL (ref 30.0–36.0)
MCV: 83.8 fL (ref 78.0–100.0)
PLATELETS: 158 10*3/uL (ref 150–400)
RBC: 3.27 MIL/uL — AB (ref 4.22–5.81)
RDW: 15.8 % — ABNORMAL HIGH (ref 11.5–15.5)
WBC: 4.7 10*3/uL (ref 4.0–10.5)

## 2014-05-06 LAB — BASIC METABOLIC PANEL
Anion gap: 2 — ABNORMAL LOW (ref 5–15)
BUN: 19 mg/dL (ref 6–23)
CALCIUM: 7.2 mg/dL — AB (ref 8.4–10.5)
CO2: 17 mmol/L — AB (ref 19–32)
CREATININE: 0.71 mg/dL (ref 0.50–1.35)
Chloride: 111 mEq/L (ref 96–112)
GFR calc Af Amer: >90 mL/min (ref >90)
GFR calc non Af Amer: >90 mL/min (ref >90)
GLUCOSE: 239 mg/dL — AB (ref 70–99)
Potassium: 4.5 mmol/L (ref 3.5–5.1)
Sodium: 130 mmol/L — ABNORMAL LOW (ref 135–145)

## 2014-05-06 LAB — PREPARE PLATELET PHERESIS: Unit division: 0

## 2014-05-06 MED ORDER — METOPROLOL TARTRATE 1 MG/ML IV SOLN
5.0000 mg | Freq: Four times a day (QID) | INTRAVENOUS | Status: DC
  Administered 2014-05-06 – 2014-05-09 (×13): 5 mg via INTRAVENOUS
  Filled 2014-05-06 (×13): qty 5

## 2014-05-06 MED ORDER — HYDRALAZINE HCL 20 MG/ML IJ SOLN
5.0000 mg | Freq: Four times a day (QID) | INTRAMUSCULAR | Status: DC
  Administered 2014-05-06 – 2014-05-07 (×5): 5 mg via INTRAVENOUS
  Filled 2014-05-06 (×5): qty 1

## 2014-05-06 NOTE — Op Note (Signed)
Frank [name], KISS NO.:  [id number]  MEDICAL RECORD NO.:  [id number]  LOCATION:                                 FACILITY:  PHYSICIAN:  Fenton Malling. Lucia Gaskins, M.D.  DATE OF BIRTH:  Jan 11, 1946  DATE OF PROCEDURE:  05/03/2014                              OPERATIVE REPORT   PREOPERATIVE DIAGNOSIS:  Probable perforated sigmoid colon carcinoma.  POSTOPERATIVE DIAGNOSIS:  Perforated sigmoid colon carcinoma with multiple interloop abscesses and advanced metastatic disease to his liver.  PROCEDURE:  Abdominal exploration with distal left colon and sigmoid colon resection and end left colon colostomy, Hartmann pouch.  SURGEON:  Fenton Malling. Lucia Gaskins, M.D.  FIRST ASSISTANT:  None.  ANESTHESIA:  General endotracheal.  Supervised by Dr. Montez Hageman.  ESTIMATED BLOOD LOSS:  800 mL.  He was given 3 units of FFP and 2 units of blood.  DRAINS:  There are no drains.  SPECIMENS:  His specimen was in 2 parts.  There was the sigmoid colon with a suture marking the proximal end of this that had the perforated cancer and then proximal to this it was part of the left colon with a staple line representing the distal segment.  INDICATION FOR PROCEDURE:  Frank [name] is a 68 year old white male who sees Dr. [name] as his primary care doctor.  He has been diagnosed with advanced metastatic colon cancer to the liver.  I do not think anybody from General Surgery has been involved with his care before this presentation, but he has been seeing Dr. Zola Button.   He has been through 2 courses of chemotherapy including Avastin to treat this advanced metastatic colon cancer.  He has not tolerated the chemotherapy well and has had about a 5-day history of abdominal pain.  He was admitted late last evening with a CT scan showed an apparent perforation in the area of the sigmoid colon.  He was initially seen by Dr. Rockne Coons for our service.  I had a lengthy discussion with the patient, his son,  Frank [name] (who is the power of attorney), his girlfriend of over 10 years, Frank [name], and his daughter Frank [name] about the options of palliative care versus proceeding with surgery.  He understands his long-term prognosis is very poor with the metastatic colon cancer.  In discussions with Dr. Osker Mason, his best guess is that he probably has at best, maybe 6 months of survival.  However, Ms. Sames has decided to proceed with operative intervention of this perforation.  He understands and we discussed the risks, which includes, but is not limited to, bleeding, infection, which he already has, failure of wound healing, failure to thrive after the operation, and further complications of either from the surgery itself or from medical issues unrelated to the surgery.  OPERATIVE NOTE:  The patient was taken to room #1 at [facility], underwent a general endotracheal anesthetic and supervised by Dr. Montez Hageman.  He had a central line placed for IV access.  He had a Foley catheter in place and an NG tube in place.  His abdomen was prepped with ChloraPrep and sterilely draped.  He was already on IV antibiotics including  Maxipime and Flagyl.  A time-out was held and a surgical checklist run.    I made a midline incision and got into the abdominal cavity.  He had approximately 2 L of fluid which is a combination of ascites and an infection/abscess and this was aspirated out.  His NG tube was in good position.  The liver was palpated and actually appears more replaced by liver than the appears on CT scan.  To my exam the liver is probably 80 or 90 per percent replaced with cancer.  The small bowel was run from the ligament of Treitz to the terminal ileum, particularly his distal half of the small bowel.  He had multiple interloop abscesses with a stringy fibrinous material.  This was broken up both manually and with irrigation.  His right colon, transverse colon, and left colon were unremarkable.  His  sigmoid colon was right at the pelvic brim, so we have made to the distal third of the sigmoid colon.  He had a nodular mass and on the side wall, it perforated both laterally to the right side of the colon and into the mesentery.  He had a very thickened retroperitoneum, I think secondary to both the tumor and to the infection.  I found the left ureter posteriorly.   I basically tried to hug the colon during the resection, this was not really a cancer operation in trying to get clear margins because there is already significant metastatic disease.  He was significantly compromised as far as ability to identify organs such as the ureter.  I divided the junction of left colon of sigmoid colon and went down right above the rectum may be 10 cm above the peritoneal reflection.  I divided it proximally with a 75 GIA stapler.  I divided it distally with a green load of the contour stapler.  I took the mesentery down with a LigaSure and blood vessels were identified and ligated with a 2-0 silk suture.  I placed a long 2-0 silk suture on the proximal end of the sigmoid colon and sent this to Pathology.  I lost about 300 to 400 mL of blood in this part of the dissection.  I irrigated the wound, left a lap along the raw mesentery surface, and he had a fairly foreshortened mesentery, I think in part due to chronic inflammation.  I had to mobilize the entire colon and splenic flexure.  The splenic flexure was very high, to get this down to get enough bowel up to the abdominal wall to bring out a colostomy.  I lost probably another 300 to 400 mL of blood in mobilizing the splenic flexure, so my total blood loss was 700 to 800 mL of blood.  After mobilizing the colon, I then reinspected the pelvis, I reinspected the colon, I irrigated him between 7 and 8 L of saline until the saline was clear.  I had left laps in both the dissection sites and there was no evidence of any active bleeding.  During the surgery, he was given 3  units of FFP and 2 units of blood.  I then made an incision midway between the umbilicus and the left costal margin for colostomy in the left upper quadrant.  I checked again the NG tube in good position.  The colon was tacked to the anterior abdominal wall with interrupted 2-0 Vicryl sutures.  I then closed the abdomen with interrupted #1 Novafil sutures and inverting the knots.  I did not try to  close the wound but just packed it with saline gauze.  I pulled out an additional 6 inches of colon, which I amputated.  He had good blood supply to his bowels that was up there.  I then matured the colostomy with interrupted 3-0 Vicryl sutures.  His bowel wall was somewhat edematous. Finger inspection of the ostomy was patent, I do not think over restricted.  The patient tolerated the procedure well and was transported to the recovery room in good condition. Sponge and needle count were correct at the end of the case.  There will be an attempt to extubate him.  Chest x-ray and labs will be obtained in the recovery room.  His prognosis, short-term and long-term is guarded.   Fenton Malling. Lucia Gaskins, M.D., [facility], secretary for [employer]   DHN/MEDQ  D:  05/03/2014  T:  05/03/2014  Job:  [id number]  cc:   Timmie Foerster, MD  [name], M.D. Fax: [phone]  Julieta Bellini, MD  Wyatt Portela, M.D. Fax: [phone]

## 2014-05-06 NOTE — Progress Notes (Signed)
ANTIBIOTIC CONSULT NOTE - INITIAL  Pharmacy Consult for Cefepime Indication: Perforated bowel, peritonitis  Allergies  Allergen Reactions  . Simvastatin Other (See Comments)    "Muscle aches"    Patient Measurements: Height: 5\' 9"  (175.3 cm) Weight: 208 lb 5.4 oz (94.5 kg) IBW/kg (Calculated) : 70.7  Vital Signs: Temp: 97.6 F (36.4 C) (12/27 0800) Temp Source: Oral (12/27 0800) BP: 187/102 mmHg (12/27 1413) Pulse Rate: 79 (12/27 1300) Intake/Output from previous day: 12/26 0701 - 12/27 0700 In: 2910 [I.V.:2610; IV Piggyback:300] Out: 1425 [Urine:1325; Stool:100] Intake/Output from this shift: Total I/O In: -  Out: 850 [Urine:850]  Labs:  Recent Labs  05/04/14 0510 05/05/14 0425 05/06/14 0840  WBC 1.9* 2.3* 4.7  HGB 9.3* 8.0* 9.1*  PLT 128* 124* 158  CREATININE 1.20 0.87 0.71   Estimated Creatinine Clearance: 97.5 mL/min (by C-G formula based on Cr of 0.71). No results for input(s): VANCOTROUGH, VANCOPEAK, VANCORANDOM, GENTTROUGH, GENTPEAK, GENTRANDOM, TOBRATROUGH, TOBRAPEAK, TOBRARND, AMIKACINPEAK, AMIKACINTROU, AMIKACIN in the last 72 hours.   Microbiology: Recent Results (from the past 720 hour(s))  TECHNOLOGIST REVIEW     Status: None   Collection Time: 04/10/14  9:29 AM  Result Value Ref Range Status   Technologist Review Metas and Myelocytes present  Final  TECHNOLOGIST REVIEW     Status: None   Collection Time: 05/02/14  3:10 PM  Result Value Ref Range Status   Technologist Review Rare meta  Final  Urine culture     Status: None   Collection Time: 05/03/14 12:39 AM  Result Value Ref Range Status   Specimen Description URINE, RANDOM  Final   Special Requests NONE  Final   Colony Count NO GROWTH Performed at [facility]   Final   Culture NO GROWTH Performed at [facility]   Final   Report Status 05/04/2014 FINAL  Final  Anaerobic culture     Status: None (Preliminary result)   Collection Time: 05/03/14  3:26 PM  Result Value  Ref Range Status   Specimen Description PERITONEAL  Final   Special Requests NONE  Final   Gram Stain   Final    NO WBC SEEN RARE GRAM NEGATIVE RODS Performed at [facility]    Culture   Final    NO ANAEROBES ISOLATED; CULTURE IN PROGRESS FOR 5 DAYS Performed at [facility]    Report Status PENDING  Incomplete  MRSA PCR Screening     Status: None   Collection Time: 05/03/14  8:27 PM  Result Value Ref Range Status   MRSA by PCR NEGATIVE NEGATIVE Final    Comment:        The GeneXpert MRSA Assay (FDA approved for NASAL specimens only), is one component of a comprehensive MRSA colonization surveillance program. It is not intended to diagnose MRSA infection nor to guide or monitor treatment for MRSA infections.     Medical History: Past Medical History  Diagnosis Date  . Hyperlipidemia   . Hypertension   . Ischemic heart disease   . Obesity   . Difficult intubation 05/03/2014    anterior airway accessed by Glidescope    Medications:  Anti-infectives    Start     Dose/Rate Route Frequency Ordered Stop   05/03/14 0200  metroNIDAZOLE (FLAGYL) IVPB 500 mg  Status:  Discontinued     500 mg100 mL/hr over 60 Minutes Intravenous Every 8 hours 05/03/14 0018 05/03/14 0018   05/03/14 0100  ciprofloxacin (CIPRO) IVPB 400 mg  Status:  Discontinued     400 mg200 mL/hr over 60 Minutes Intravenous Every 12 hours 05/03/14 0016 05/03/14 0018   05/03/14 0100  metroNIDAZOLE (FLAGYL) IVPB 500 mg     500 mg100 mL/hr over 60 Minutes Intravenous Every 8 hours 05/03/14 0021     05/03/14 0100  ceFEPIme (MAXIPIME) 2 g in dextrose 5 % 50 mL IVPB     2 g100 mL/hr over 30 Minutes Intravenous Every 8 hours 05/03/14 0031     05/03/14 0000  metroNIDAZOLE (FLAGYL) IVPB 500 mg  Status:  Discontinued     500 mg100 mL/hr over 60 Minutes Intravenous Every 8 hours 05/02/14 2356 05/03/14 0018     Assessment: 68yo M w/ nausea, abdominal pain, generalized weakness, and neutropenia.  Metastatic colon cancer, last received chemotherapy on 12/15-12/17. Denies feeling feverish and is afebrile in the ED. CT revealed sigmoid colon perforation and peritonitis. Flagyl started and pharmacy is asked to dose Cefepime.  Now s/p hemicolectomy with colostomy  12/24 >> Cipro - cancelled 12/24 >> Flagyl >> 12/24 >> Cefepime >>  Tmax: AF since admit WBCs: low on admission, now WNL Renal: SCr back to baseline, stable, CrCl now 75 ml/min using SCr=1  12/24 peritoneal Cx (anaerobic): Rare GNR, ID/sens pending 12/24 urine: NGF 12/24 MRSA nasal: negative   Goal of Therapy:  Appropriate antibiotic dosing for renal function; eradication of infection   Plan:   Continue cefepime 2g IV q8h.  Although appears stable and no longer neutropenic, will keep high dose for now with GNR in peritoneal fluid culture as this is the recommended dosing for pseudomonal infection.  Would reduce to 2g q12 if not pseudomonas.  Follow clinical course, SCr, peritoneal culture   Reuel Boom, PharmD Pager: [phone] 05/06/2014, 2:35 PM

## 2014-05-06 NOTE — Progress Notes (Signed)
PROGRESS NOTE  Frank [name] DXI:[id number] DOB: [birth year] DOA: 05/02/2014 PCP: [name], MD  abd pain, nausea, vomiting: due to bowel perforation and peritonitis -continue PRN antiemetics and analgesics -continue IV antibiotics for now; no fever -s/p hemicolectomy with end colostomy 05/03/14 -follow general surgery rec's -started sips on 05/06/14; NGT removed on 12/25 -continue supportive care, follow CVP's and maintain fluid resuscitation   tachycardia: due to dehydration, blood loss and pain -will continue IV metoprolol (to prevent rebound) -improved  pancytopenia: due to ongoing chemotherapy -will avoid heparin products for now; will use SCD's -follow blood ceel count and transfuse as needed -oncology on board and will follow any specific rec's -loss almost 1000ML of blood during surgery, received 2 units PRBC's -current Hgb 8.0 (goal given hx of CAD and CABG is hgb > 8); holding on transfusion today; most likely dilutional effect with aggressive fluid resuscitation  Uncontrolled HTN:  -Increase Lopressor to 5 mg IV every 6 hours -Add Hydralazine 5mg  IV q 6 hrs -will follow VS  chronic diastolic heart failure -compensated at this point -will continue holding lasix -daily weights and strict intake/output -continue low dose b-blocker ordered  hx of CAD:  s/p CABG in the past -no CP or SOB. -continue B-blocker -will monitor.  metastatic colon cancer:  -per oncology service.  Code Status: DNR Family Communication: no one at bedside Disposition Plan: to be determine      Procedures/Studies: Ct Head Wo Contrast  05/02/2014   CLINICAL DATA:  Increased confusion today with nausea and vomiting  EXAM: CT HEAD WITHOUT CONTRAST  TECHNIQUE: Contiguous axial images were obtained from the base of the skull through the vertex without intravenous contrast.  COMPARISON:  None  FINDINGS: Advanced atrophy with prominence of the bifrontal extra-axial spaces.  Gray-white differentiation is maintained. No CT evidence of acute large territory infarct. No intraparenchymal extra-axial mass or hemorrhage. Unchanged size and configuration of the ventricles and basilar cisterns. No midline shift.  Limited visualization of the paranasal sinuses and mastoid air cells are normal. No air-fluid level. There is potential enlargement of the sella (image 9, series 3).  Regional soft tissues appear normal. Paranasal sinuses and mastoid air cells are normal. No displaced calvarial fracture.  IMPRESSION: 1. Advanced atrophy without acute intracranial process. 2. Nonspecific potential enlargement of the sella. Further evaluation could be performed with nonemergent brain MRI as clinically indicated.   Electronically Signed   By: [name] M.D.   On: 05/02/2014 21:51   Ct Abdomen Pelvis W Contrast  05/02/2014   CLINICAL DATA:  Mid abdominal pain, nausea and vomiting, after chemotherapy. History of colon cancer.  EXAM: CT ABDOMEN AND PELVIS WITH CONTRAST  TECHNIQUE: Multidetector CT imaging of the abdomen and pelvis was performed using the standard protocol following bolus administration of intravenous contrast.  CONTRAST:  157mL OMNIPAQUE IOHEXOL 300 MG/ML  SOLN  COMPARISON:  PET-CT 03/07/2014, CT abdomen/ pelvis 02/22/2014  FINDINGS: Evidence of CABG partly visualized. Trace pleural effusions are noted with associated bibasilar linear presumed atelectasis.  A few foci of free intraperitoneal air are identified image 19. Epicardial nodes are identified, 0.6 cm diameter image 12.  Innumerable hepatic masses are reidentified with overall nodular liver contour suggesting underlying cirrhosis. Representative posterior segment right hepatic lobe mass is slightly smaller, now 5.4 x 4.7 cm image 22, previously 6.8 x 5.8 cm.  Porta hepatis node is stable measuring 0.9 cm image 30. Stable subcentimeter periaortic lymph nodes.  There are numerous loops of small bowel with diameter at upper limits  of normal, representative 3.1 cm diameter loop of bowel within the left upper quadrant image 32, but which demonstrate abnormal wall thickening. Within the colon, the previously seen area of masslike sigmoid wall thickening is reidentified, image 71. There is a new rim enhancing gas/ fluid collection compatible with abscess from colonic perforation image 70, measuring 3.5 x 3.3 cm. Foci of gas are noted tracking throughout the mesentery as well. Large volume ascites is noted with interval development of rim enhancement suggesting superimposed infection, for example image 72. Pericolonic nodes are reidentified, representative 0.6 cm node image 64. There is increased pericolonic stranding. Increased stranding and fluid infiltration of the mesentery/omentum is noted without measurable nodularity.  Aortic atheromatous calcification without aneurysm. Fat containing left inguinal hernia noted. The bladder is unremarkable.  Evidence of right L5 laminectomy. Multilevel lumbar spine disc degenerative change noted. No focal osseous lesion is identified. The bones are subjectively mildly sclerotic, unchanged, raising the question of possible metastatic infiltration although this could be a normal variant.  IMPRESSION: Interval sigmoid colonic perforation adjacent to the previously seen masslike wall thickening compatible with primary colonic carcinoma and intra-abdominal abscess formation.  Small bowel wall thickening may suggest secondary enteritis. No evidence for small bowel obstruction at this time.  Diffusely rim enhancing large volume ascites and mesenteric infiltration suggesting peritonitis.  Decrease in size of hepatic representative metastatic lesion.  Critical Value/emergent results were called by telephone at the time of interpretation on 05/02/2014 at 11:38 pm to [facility] for Dr. Berle Mull , who verbally acknowledged these results.   Electronically Signed   By: Conchita Paris M.D.   On: 05/02/2014 23:40   Dg  Chest Port 1 View  05/03/2014   CLINICAL DATA:  Central line placement.  EXAM: PORTABLE CHEST - 1 VIEW  COMPARISON:  02/22/2014  FINDINGS: Sternotomy wires are present. Nasogastric tube has tip and side-port over the stomach in the left upper quadrant. Left IJ central venous catheter has tip in the region of the cavoatrial junction. Right IJ central venous catheter has tip over the region of the SVC.  Lungs are hypoinflated with mild linear density in the left base likely atelectasis. There is subtle density in the medial right base likely atelectasis. No definite effusion or pneumothorax. Cardiomediastinal silhouette and remainder of the exam is unchanged.  IMPRESSION: Hypoinflation with mild bibasilar opacification likely atelectasis.  Tubes and lines as described.  No definite pneumothorax.   Electronically Signed   By: Marin Olp M.D.   On: 05/03/2014 18:53         Subjective:  patient complains of abdominal pain. Denies fevers, chills, chest pain, vomiting, headache, dizziness.   Objective: Filed Vitals:   05/06/14 0400 05/06/14 0500 05/06/14 0700 05/06/14 0800  BP:  206/74 187/71   Pulse:  66 57   Temp: 97.5 F (36.4 C)   97.6 F (36.4 C)  TempSrc: Oral   Oral  Resp:  25 18   Height:      Weight: 94.5 kg (208 lb 5.4 oz)     SpO2:  96% 97%     Intake/Output Summary (Last 24 hours) at 05/06/14 1314 Last data filed at 05/06/14 0851  Gross per 24 hour  Intake   1200 ml  Output   2000 ml  Net   -800 ml   Weight change: 4.8 kg (10 lb 9.3 oz) Exam:   General:  Pt is alert,  follows commands appropriately, not in acute distress  HEENT: No icterus, No thrush, [place]/AT  Cardiovascular: RRR, S1/S2, no rubs, no gallops  Respiratory: CTA bilaterally, no wheezing, no crackles, no rhonchi  Abdomen: Soft/+BS, diffusely tender without any peritoneal signs, non distended, n  Extremities: 1+LE edema, No lymphangitis, No petechiae, No rashes, no synovitis  Data Reviewed: Basic  Metabolic Panel:  Recent Labs Lab 05/03/14 0033 05/03/14 2105 05/04/14 0510 05/05/14 0425 05/06/14 0840  NA 135 139 136 131* 130*  K 3.8 4.3 4.0 4.1 4.5  CL 106 110 108 111 111  CO2 23 23 20  17* 17*  GLUCOSE 117* 155* 267* 229* 239*  BUN 25* 29* 33* 27* 19  CREATININE 1.04 1.02 1.20 0.87 0.71  CALCIUM 8.0* 7.8* 7.6* 7.2* 7.2*  MG  --   --   --  1.7  --    Liver Function Tests:  Recent Labs Lab 05/02/14 1511 05/03/14 0033  AST 9 11  ALT 9 9  ALKPHOS 104 139*  BILITOT 0.79 1.0  PROT 5.6* 5.5*  ALBUMIN 2.0* 2.2*    Recent Labs Lab 05/03/14 0130  LIPASE <10*    Recent Labs Lab 05/03/14 0130  AMMONIA 17   CBC:  Recent Labs Lab 05/02/14 1510 05/03/14 0033 05/03/14 2105 05/04/14 0510 05/05/14 0425 05/06/14 0840  WBC 1.1* 1.1* 2.3* 1.9* 2.3* 4.7  NEUTROABS 0.9* 0.8* 1.6* 1.2*  --   --   HGB 9.8* 9.5* 10.3* 9.3* 8.0* 9.1*  HCT 30.7* 30.0* 32.1* 28.4* 24.1* 27.4*  MCV 84.1 85.7 86.5 85.5 85.5 83.8  PLT 85* 84* 133* 128* 124* 158   Cardiac Enzymes: No results for input(s): CKTOTAL, CKMB, CKMBINDEX, TROPONINI in the last 168 hours. BNP: Invalid input(s): POCBNP CBG:  Recent Labs Lab 05/02/14 2120 05/03/14 0723 05/03/14 1955  GLUCAP 106* 103* 147*    Recent Results (from the past 240 hour(s))  TECHNOLOGIST REVIEW     Status: None   Collection Time: 05/02/14  3:10 PM  Result Value Ref Range Status   Technologist Review Rare meta  Final  Urine culture     Status: None   Collection Time: 05/03/14 12:39 AM  Result Value Ref Range Status   Specimen Description URINE, RANDOM  Final   Special Requests NONE  Final   Colony Count NO GROWTH Performed at [facility]   Final   Culture NO GROWTH Performed at [facility]   Final   Report Status 05/04/2014 FINAL  Final  Anaerobic culture     Status: None (Preliminary result)   Collection Time: 05/03/14  3:26 PM  Result Value Ref Range Status   Specimen Description PERITONEAL  Final     Special Requests NONE  Final   Gram Stain   Final    NO WBC SEEN RARE GRAM NEGATIVE RODS Performed at [facility]    Culture   Final    NO ANAEROBES ISOLATED; CULTURE IN PROGRESS FOR 5 DAYS Performed at [facility]    Report Status PENDING  Incomplete  MRSA PCR Screening     Status: None   Collection Time: 05/03/14  8:27 PM  Result Value Ref Range Status   MRSA by PCR NEGATIVE NEGATIVE Final    Comment:        The GeneXpert MRSA Assay (FDA approved for NASAL specimens only), is one component of a comprehensive MRSA colonization surveillance program. It is not intended to diagnose MRSA infection nor to guide or monitor treatment  for MRSA infections.      Scheduled Meds: . sodium chloride   Intravenous Once  . sodium chloride   Intravenous Once  . sodium chloride  10 mL/hr Intravenous Once  . antiseptic oral rinse  7 mL Mouth Rinse BID  . ceFEPime (MAXIPIME) IV  2 g Intravenous Q8H  . hydrALAZINE  5 mg Intravenous Q6H  . metoprolol  5 mg Intravenous 4 times per day  . metronidazole  500 mg Intravenous Q8H  . morphine   Intravenous 6 times per day  . pantoprazole (PROTONIX) IV  40 mg Intravenous Q24H  . sodium chloride  10 mL Intravenous Q12H   Continuous Infusions: . sodium chloride 1,000 mL (05/04/14 1800)  . dextrose 5 % and 0.45 % NaCl with KCl 20 mEq/L 250 mL/hr (05/06/14 1207)     Ignazio Kincaid, DO  [facility] Pager [phone]  If 7PM-7AM, please contact night-coverage [email or web address] Password [email or web address] 05/06/2014, 1:14 PM   LOS: 4 days

## 2014-05-06 NOTE — Progress Notes (Signed)
General Surgery Note  LOS: 4 days  POD -  3 Days Post-Op  Assessment/Plan: 1.  SIGMOID AND LEFT COLON COLECTOMY, END COLOSTOMY, MOBILIZATION OF SPLENIC FLEXURE - 05/03/2014 - D. Serita Degroote  For perforated sigmoid colon at site of colon cancer - final path pending  On Cefepime/Flagyl  [employer] yesterday.  Will allow limited sips from floor.  Still await bowel function.  He may be moved out of the ICU.  1A.  Open wound - this looks good so far  2.  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 Wanda Plump. [name] 3.  History of CAD  Followed by Dr. Vaughan Browner - coronary artery bypass graft surgery in 2001. His last Myoview was 03/12/05 and at that time he had an ejection fraction of 50%. 4.  Leukopenia improving - WBC 2,300 - 05/05/2014  (today's labs pending) 5.  Malnutrition  Will start po's at earliest convenience 6.  DVT prophylaxis - chemoprophylaxis on hold for thrombocytopenia, coagulopathy, and anemia 7.  HTN 8.  Anemia - Hgb - 8.0 - 05/05/2014  He received 2 units of blood during the surgery  No evidence of blood loss since surgery - I think decreased Hgb in part dilutional from significant fluid resuscitation and his underlying malnourished state. 9.  Foley - UO now good.  This is probably a better indicator of intravascular volume.  Will get foley out.   Principal Problem:   Perforation of colon cancer s/p colectomy/colostomy 05/03/2014 Active Problems:   Liver masses   Malignant neoplasm of colon   Abdominal pain   Dehydration   Mucositis due to antineoplastic therapy   Intractable nausea and vomiting   Acute encephalopathy   Perforated bowel   Palliative care encounter   Subjective:  Alert.  Says that he doesn't feel as good today, but looks good to me. Objective:   Filed Vitals:   05/06/14 0500  BP: 206/74  Pulse: 66  Temp:   Resp: 25     Intake/Output from previous day:  12/26 0701 - 12/27  0700 In: 2910 [I.V.:2610; IV Piggyback:300] Out: 725 [Urine:625; Stool:100]  Intake/Output this shift:      Physical Exam:   General: WM who is alert.   HEENT: Normal. Pupils equal. .   Lungs: Clear.   Abdomen: Soft.  Has BS.   Wound: Clean.  Ostomy LUQ looks okay - no output yet.   Lab Results:     Recent Labs  05/04/14 0510 05/05/14 0425  WBC 1.9* 2.3*  HGB 9.3* 8.0*  HCT 28.4* 24.1*  PLT 128* 124*    BMET    Recent Labs  05/04/14 0510 05/05/14 0425  NA 136 131*  K 4.0 4.1  CL 108 111  CO2 20 17*  GLUCOSE 267* 229*  BUN 33* 27*  CREATININE 1.20 0.87  CALCIUM 7.6* 7.2*    PT/INR   No results for input(s): LABPROT, INR in the last 72 hours.  ABG  No results for input(s): PHART, HCO3 in the last 72 hours.  Invalid input(s): PCO2, PO2   Studies/Results:  No results found.   Anti-infectives:   Anti-infectives    Start     Dose/Rate Route Frequency Ordered Stop   05/03/14 0200  metroNIDAZOLE (FLAGYL) IVPB 500 mg  Status:  Discontinued     500 mg100 mL/hr over 60 Minutes Intravenous Every 8 hours 05/03/14 0018 05/03/14 0018   05/03/14 0100  ciprofloxacin (  CIPRO) IVPB 400 mg  Status:  Discontinued     400 mg200 mL/hr over 60 Minutes Intravenous Every 12 hours 05/03/14 0016 05/03/14 0018   05/03/14 0100  metroNIDAZOLE (FLAGYL) IVPB 500 mg     500 mg100 mL/hr over 60 Minutes Intravenous Every 8 hours 05/03/14 0021     05/03/14 0100  ceFEPIme (MAXIPIME) 2 g in dextrose 5 % 50 mL IVPB     2 g100 mL/hr over 30 Minutes Intravenous Every 8 hours 05/03/14 0031     05/03/14 0000  metroNIDAZOLE (FLAGYL) IVPB 500 mg  Status:  Discontinued     500 mg100 mL/hr over 60 Minutes Intravenous Every 8 hours 05/02/14 2356 05/03/14 0018      Alphonsa Overall, MD, FACS Pager: [facility] Office: [phone] 05/06/2014

## 2014-05-06 NOTE — Consult Note (Addendum)
WOC ostomy consult note Stoma type/location:  Colostomy to LLQ from surgery on 12/24. Pt is followed by [facility] team for assessment and plan of care to post-op abd wound. Stomal assessment/size: Stoma red and viable when visualized through pouch, which is intact with good seal. Output: No stool or flatus at this time.  Small amt brown liquid in pouch. Ostomy pouching: 1pc. Kayara pouch intact  Education provided:  Demonstrated pouch appearance and briefly described pouching routines and emptying.  Pt states his girlfriend has experience with pouch emptying and application; she is not here at this time.  Pouching supplies at bedside for staff nurse use. [facility] team will plan to begin pouch change demonstrations tomorrow when pt is stable and out of ICU. Julien Girt MSN, RN, [place], [place], [place]

## 2014-05-07 ENCOUNTER — Encounter (HOSPITAL_COMMUNITY): Payer: Self-pay | Admitting: Surgery

## 2014-05-07 DIAGNOSIS — D62 Acute posthemorrhagic anemia: Secondary | ICD-10-CM

## 2014-05-07 DIAGNOSIS — K659 Peritonitis, unspecified: Secondary | ICD-10-CM

## 2014-05-07 DIAGNOSIS — R109 Unspecified abdominal pain: Secondary | ICD-10-CM | POA: Insufficient documentation

## 2014-05-07 DIAGNOSIS — C787 Secondary malignant neoplasm of liver and intrahepatic bile duct: Secondary | ICD-10-CM

## 2014-05-07 DIAGNOSIS — C187 Malignant neoplasm of sigmoid colon: Principal | ICD-10-CM

## 2014-05-07 LAB — TYPE AND SCREEN
ABO/RH(D): B POS
Antibody Screen: NEGATIVE
UNIT DIVISION: 0
Unit division: 0
Unit division: 0
Unit division: 0

## 2014-05-07 LAB — BASIC METABOLIC PANEL
Anion gap: 3 — ABNORMAL LOW (ref 5–15)
BUN: 15 mg/dL (ref 6–23)
CO2: 18 mmol/L — ABNORMAL LOW (ref 19–32)
Calcium: 7.5 mg/dL — ABNORMAL LOW (ref 8.4–10.5)
Chloride: 110 mEq/L (ref 96–112)
Creatinine, Ser: 0.66 mg/dL (ref 0.50–1.35)
GFR calc Af Amer: >90 mL/min (ref >90)
Glucose, Bld: 96 mg/dL (ref 70–99)
Potassium: 4 mmol/L (ref 3.5–5.1)
SODIUM: 131 mmol/L — AB (ref 135–145)

## 2014-05-07 LAB — CBC
HEMATOCRIT: 33.4 % — AB (ref 39.0–52.0)
Hemoglobin: 11.1 g/dL — ABNORMAL LOW (ref 13.0–17.0)
MCH: 27.5 pg (ref 26.0–34.0)
MCHC: 33.2 g/dL (ref 30.0–36.0)
MCV: 82.9 fL (ref 78.0–100.0)
PLATELETS: 245 10*3/uL (ref 150–400)
RBC: 4.03 MIL/uL — ABNORMAL LOW (ref 4.22–5.81)
RDW: 16.1 % — ABNORMAL HIGH (ref 11.5–15.5)
WBC: 9.6 10*3/uL (ref 4.0–10.5)

## 2014-05-07 MED ORDER — OXYCODONE HCL 5 MG/5ML PO SOLN
5.0000 mg | ORAL | Status: DC | PRN
  Administered 2014-05-12 – 2014-05-18 (×3): 5 mg via ORAL
  Filled 2014-05-07 (×3): qty 5

## 2014-05-07 MED ORDER — OXYCODONE-ACETAMINOPHEN 5-325 MG PO TABS
1.0000 | ORAL_TABLET | ORAL | Status: DC | PRN
  Administered 2014-05-07 – 2014-05-15 (×33): 1 via ORAL
  Administered 2014-05-15 – 2014-05-16 (×3): 2 via ORAL
  Administered 2014-05-16: 1 via ORAL
  Administered 2014-05-16: 2 via ORAL
  Administered 2014-05-17 – 2014-05-18 (×7): 1 via ORAL
  Administered 2014-05-18 – 2014-05-19 (×2): 2 via ORAL
  Administered 2014-05-19: 1 via ORAL
  Administered 2014-05-19 – 2014-05-21 (×11): 2 via ORAL
  Filled 2014-05-07 (×2): qty 1
  Filled 2014-05-07: qty 2
  Filled 2014-05-07 (×13): qty 1
  Filled 2014-05-07 (×2): qty 2
  Filled 2014-05-07 (×7): qty 1
  Filled 2014-05-07: qty 2
  Filled 2014-05-07 (×2): qty 1
  Filled 2014-05-07 (×5): qty 2
  Filled 2014-05-07 (×4): qty 1
  Filled 2014-05-07: qty 2
  Filled 2014-05-07: qty 1
  Filled 2014-05-07 (×2): qty 2
  Filled 2014-05-07: qty 1
  Filled 2014-05-07 (×2): qty 2
  Filled 2014-05-07 (×3): qty 1
  Filled 2014-05-07: qty 2
  Filled 2014-05-07: qty 1
  Filled 2014-05-07 (×2): qty 2
  Filled 2014-05-07 (×5): qty 1
  Filled 2014-05-07: qty 2
  Filled 2014-05-07 (×2): qty 1

## 2014-05-07 MED ORDER — ACETAMINOPHEN 160 MG/5ML PO SOLN: 650.0000 mg | ORAL | Status: DC | PRN

## 2014-05-07 MED ORDER — OXYCODONE-ACETAMINOPHEN 5-325 MG PO TABS
1.0000 | ORAL_TABLET | ORAL | Status: DC | PRN
  Administered 2014-05-07: 1 via ORAL
  Filled 2014-05-07: qty 1

## 2014-05-07 MED ORDER — HYDROMORPHONE HCL 1 MG/ML IJ SOLN
0.5000 mg | INTRAMUSCULAR | Status: DC | PRN
  Administered 2014-05-20: 0.5 mg via INTRAVENOUS
  Filled 2014-05-07: qty 1

## 2014-05-07 MED ORDER — SODIUM CHLORIDE 0.9 % IV SOLN
INTRAVENOUS | Status: DC
  Administered 2014-05-07 – 2014-05-08 (×4): via INTRAVENOUS
  Filled 2014-05-07 (×6): qty 1000

## 2014-05-07 MED ORDER — HYDRALAZINE HCL 20 MG/ML IJ SOLN
5.0000 mg | Freq: Two times a day (BID) | INTRAMUSCULAR | Status: DC
  Administered 2014-05-08: 5 mg via INTRAVENOUS
  Filled 2014-05-07: qty 1

## 2014-05-07 NOTE — Progress Notes (Signed)
IP PROGRESS NOTE  Subjective:   Events noted last few days. He tolerated surgery without any major complications. He is already ambulating with physical therapy without any complications. His pain still present but much improved. No other complaints at this time.  Objective:  Vital signs in last 24 hours: Temp:  [97.6 F (36.4 C)-97.8 F (36.6 C)] 97.6 F (36.4 C) (12/28 0400) Pulse Rate:  [59-102] 81 (12/28 0600) Resp:  [21-35] 28 (12/28 0600) BP: (114-203)/(67-128) 137/72 mmHg (12/28 0600) SpO2:  [95 %-98 %] 96 % (12/28 0600) Weight:  [206 lb 9.1 oz (93.7 kg)] 206 lb 9.1 oz (93.7 kg) (12/28 0400) Weight change: -1 lb 12.2 oz (-0.8 kg) Last BM Date:  (pta )  Intake/Output from previous day: 12/27 0701 - 12/28 0700 In: 2415 [I.V.:1965; IV Piggyback:450] Out: 2775 [Urine:2725; Stool:50]  Well-appearing gentleman did not appear in any distress. Mouth: mucous membranes moist, pharynx normal without lesions Resp: clear to auscultation bilaterally Cardio: regular rate and rhythm, S1, S2 normal, no murmur, click, rub or gallop GI: Soft and very tender to touch. With rebound and guarding. Hyperactive bowel sounds. Extremities: extremities normal, atraumatic, no cyanosis or edema  Portacath without erythema  Lab Results:  Recent Labs  05/06/14 0840 05/07/14 0438  WBC 4.7 9.6  HGB 9.1* 11.1*  HCT 27.4* 33.4*  PLT 158 245    BMET  Recent Labs  05/06/14 0840 05/07/14 0438  NA 130* 131*  K 4.5 4.0  CL 111 110  CO2 17* 18*  GLUCOSE 239* 96  BUN 19 15  CREATININE 0.71 0.66  CALCIUM 7.2* 7.5*      Medications: I have reviewed the patient's current medications.  Assessment/Plan:  68 year old gentleman with the following issues:  1. 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 He received 2 cycles of chemotherapy with some decrease in his hepatic metastasis as evident by the CT scan noted on 05/02/2014. Chemotherapy will be on hold  to he recovered from this operation.   2. Colonic perforation and peritonitis. He is status post sigmoid left colon colectomy and end colostomy done on 05/03/2014 for a perforated sigmoid colon. He tolerated the procedure well and so far without any major complications. His pain is reasonably controlled and awaiting on bowel function  3. Prognosis: He understands that he still have an incurable malignancy but without surgery he could not have survive this episode and so far he is pleased with his decision.  4. Neutropenia: This have resolved after systemic chemotherapy.  We will continue to monitor his recovery and arrange follow-up for him in the outpatient setting to resume systemic chemotherapy in the next few weeks. Certainly he will have to heal up properly before resumption of chemotherapy. We will hold a Avastin at that time.   LOS: 5 days   [facility] 05/07/2014, 7:51 AM

## 2014-05-07 NOTE — Consult Note (Signed)
WOC ostomy consult note Stoma type/location: LLQ, end colostomy Stomal assessment/size: 1 5/8" round, budded, pink, and moist Peristomal assessment: intact  Treatment options for stomal/peristomal skin:NA  Output liquid brown fecal material  Ostomy pouching:2pc. 2 1/4" used today.  Education provided: Session with wife and daughter and law at the bedside.  Pt also is engaged in the conversation.  Pouch change demonstrated to family.  Pt seems that he will be willing to participate in the care also.  Demonstrated lock and roll closure to the patient and family.  I have reviewed basic ostomy care, diet and pouch change regimen with patient and family. They have some questions about ordering supplies, I will ask my partner to provide [employer].  [place] team will follow along with you for ostomy education and support.  Chinmay Squier Girard RN,CWOCN [phone]

## 2014-05-07 NOTE — Progress Notes (Signed)
PROGRESS NOTE  [name] [name] YQI:[id number] DOB: 02-16-1946 DOA: 05/02/2014 PCP: [name], MD  Assessment/Plan: abd pain, nausea, vomiting: due to bowel perforation and peritonitis -continue PRN antiemetics and analgesics -continue IV antibiotics for now; no fever-->change to po when able to tolerate po -s/p hemicolectomy with end colostomy 05/03/14 -making stool in ostomy -appreciate general surgery  -started sips on 05/06/14; NGT removed on 12/25 -05/07/14--started clears -CVP = 0-4-->start IVF -move out of ICU/Stepdown if okay with surgery  tachycardia: due to dehydration, blood loss and pain -continue IV metoprolol (to prevent rebound) until able to reliably take po -improved  pancytopenia: d -ue to ongoing chemotherapy -will avoid heparin products for now; will use SCD's -counts recovering -appreciate Dr. Ilene Qua -loss almost 1000ML of blood during surgery, received 2 units PRBC's -current Hgb 8.0 (goal given hx of CAD and CABG is hgb > 8)  Uncontrolled HTN:  -Increase Lopressor to 5 mg IV every 6 hours -decrease hydralazine to bid -will follow VS  chronic diastolic heart failure -compensated at this point -will continue holding lasix--CVP 0-4 -daily weights and strict intake/output -continue low dose b-blocker ordered  hx of CAD:  s/p CABG in the past -no CP or SOB. -continue B-blocker -will monitor.  metastatic colon cancer:  -per oncology service. -mets to liver  Code Status: DNR Family Communication: son updated at bedside         Procedures/Studies: Ct Head Wo Contrast  05/02/2014   CLINICAL DATA:  Increased confusion today with nausea and vomiting  EXAM: CT HEAD WITHOUT CONTRAST  TECHNIQUE: Contiguous axial images were obtained from the base of the skull through the vertex without intravenous contrast.  COMPARISON:  None  FINDINGS: Advanced atrophy with prominence of the bifrontal extra-axial spaces. Gray-white  differentiation is maintained. No CT evidence of acute large territory infarct. No intraparenchymal extra-axial mass or hemorrhage. Unchanged size and configuration of the ventricles and basilar cisterns. No midline shift.  Limited visualization of the paranasal sinuses and mastoid air cells are normal. No air-fluid level. There is potential enlargement of the sella (image 9, series 3).  Regional soft tissues appear normal. Paranasal sinuses and mastoid air cells are normal. No displaced calvarial fracture.  IMPRESSION: 1. Advanced atrophy without acute intracranial process. 2. Nonspecific potential enlargement of the sella. Further evaluation could be performed with nonemergent brain MRI as clinically indicated.   Electronically Signed   By: [name] M.D.   On: 05/02/2014 21:51   Ct Abdomen Pelvis W Contrast  05/02/2014   CLINICAL DATA:  Mid abdominal pain, nausea and vomiting, after chemotherapy. History of colon cancer.  EXAM: CT ABDOMEN AND PELVIS WITH CONTRAST  TECHNIQUE: Multidetector CT imaging of the abdomen and pelvis was performed using the standard protocol following bolus administration of intravenous contrast.  CONTRAST:  162mL OMNIPAQUE IOHEXOL 300 MG/ML  SOLN  COMPARISON:  PET-CT 03/07/2014, CT abdomen/ pelvis 02/22/2014  FINDINGS: Evidence of CABG partly visualized. Trace pleural effusions are noted with associated bibasilar linear presumed atelectasis.  A few foci of free intraperitoneal air are identified image 19. Epicardial nodes are identified, 0.6 cm diameter image 12.  Innumerable hepatic masses are reidentified with overall nodular liver contour suggesting underlying cirrhosis. Representative posterior segment right hepatic lobe mass is slightly smaller, now 5.4 x 4.7 cm image 22, previously 6.8 x 5.8 cm.  Porta hepatis node is stable measuring 0.9 cm image 30. Stable subcentimeter periaortic lymph nodes.  There are numerous  loops of small bowel with diameter at upper limits of normal,  representative 3.1 cm diameter loop of bowel within the left upper quadrant image 32, but which demonstrate abnormal wall thickening. Within the colon, the previously seen area of masslike sigmoid wall thickening is reidentified, image 71. There is a new rim enhancing gas/ fluid collection compatible with abscess from colonic perforation image 70, measuring 3.5 x 3.3 cm. Foci of gas are noted tracking throughout the mesentery as well. Large volume ascites is noted with interval development of rim enhancement suggesting superimposed infection, for example image 72. Pericolonic nodes are reidentified, representative 0.6 cm node image 64. There is increased pericolonic stranding. Increased stranding and fluid infiltration of the mesentery/omentum is noted without measurable nodularity.  Aortic atheromatous calcification without aneurysm. Fat containing left inguinal hernia noted. The bladder is unremarkable.  Evidence of right L5 laminectomy. Multilevel lumbar spine disc degenerative change noted. No focal osseous lesion is identified. The bones are subjectively mildly sclerotic, unchanged, raising the question of possible metastatic infiltration although this could be a normal variant.  IMPRESSION: Interval sigmoid colonic perforation adjacent to the previously seen masslike wall thickening compatible with primary colonic carcinoma and intra-abdominal abscess formation.  Small bowel wall thickening may suggest secondary enteritis. No evidence for small bowel obstruction at this time.  Diffusely rim enhancing large volume ascites and mesenteric infiltration suggesting peritonitis.  Decrease in size of hepatic representative metastatic lesion.  Critical Value/emergent results were called by telephone at the time of interpretation on 05/02/2014 at 11:38 pm to [facility] for Dr. Berle Mull , who verbally acknowledged these results.   Electronically Signed   By: Conchita Paris M.D.   On: 05/02/2014 23:40   Dg Chest Port  1 View  05/03/2014   CLINICAL DATA:  Central line placement.  EXAM: PORTABLE CHEST - 1 VIEW  COMPARISON:  02/22/2014  FINDINGS: Sternotomy wires are present. Nasogastric tube has tip and side-port over the stomach in the left upper quadrant. Left IJ central venous catheter has tip in the region of the cavoatrial junction. Right IJ central venous catheter has tip over the region of the SVC.  Lungs are hypoinflated with mild linear density in the left base likely atelectasis. There is subtle density in the medial right base likely atelectasis. No definite effusion or pneumothorax. Cardiomediastinal silhouette and remainder of the exam is unchanged.  IMPRESSION: Hypoinflation with mild bibasilar opacification likely atelectasis.  Tubes and lines as described.  No definite pneumothorax.   Electronically Signed   By: Marin Olp M.D.   On: 05/03/2014 18:53         Subjective: Patient having hallucinations with IV morphine. Presently denies any fevers, chills, chest pain, shortness breath, coughing, hemoptysis, nausea, vomiting. He is making some stool.  Objective: Filed Vitals:   05/07/14 1000 05/07/14 1100 05/07/14 1200 05/07/14 1523  BP: 125/83 119/81 142/79 120/84  Pulse: 111 113 91   Temp:   97.4 F (36.3 C)   TempSrc:   Oral   Resp: 24 25 27    Height:      Weight:      SpO2: 95% 95% 97%     Intake/Output Summary (Last 24 hours) at 05/07/14 1549 Last data filed at 05/07/14 1200  Gross per 24 hour  Intake    850 ml  Output   1075 ml  Net   -225 ml   Weight change: -0.8 kg (-1 lb 12.2 oz) Exam:   General:  Pt is alert, follows  commands appropriately, not in acute distress  HEENT: No icterus, No thrush,  [place]/AT  Cardiovascular: RRR, S1/S2, no rubs, no gallops  Respiratory: CTA bilaterally, no wheezing, no crackles, no rhonchi  Abdomen: Soft/+BS, tenderness around the incision. non distended, no guarding  Extremities: 1+LE edema, No lymphangitis, No petechiae, No rashes,  no synovitis  Data Reviewed: Basic Metabolic Panel:  Recent Labs Lab 05/03/14 2105 05/04/14 0510 05/05/14 0425 05/06/14 0840 05/07/14 0438  NA 139 136 131* 130* 131*  K 4.3 4.0 4.1 4.5 4.0  CL 110 108 111 111 110  CO2 23 20 17* 17* 18*  GLUCOSE 155* 267* 229* 239* 96  BUN 29* 33* 27* 19 15  CREATININE 1.02 1.20 0.87 0.71 0.66  CALCIUM 7.8* 7.6* 7.2* 7.2* 7.5*  MG  --   --  1.7  --   --    Liver Function Tests:  Recent Labs Lab 05/02/14 1511 05/03/14 0033  AST 9 11  ALT 9 9  ALKPHOS 104 139*  BILITOT 0.79 1.0  PROT 5.6* 5.5*  ALBUMIN 2.0* 2.2*    Recent Labs Lab 05/03/14 0130  LIPASE <10*    Recent Labs Lab 05/03/14 0130  AMMONIA 17   CBC:  Recent Labs Lab 05/02/14 1510  05/03/14 0033 05/03/14 2105 05/04/14 0510 05/05/14 0425 05/06/14 0840 05/07/14 0438  WBC 1.1*  < > 1.1* 2.3* 1.9* 2.3* 4.7 9.6  NEUTROABS 0.9*  --  0.8* 1.6* 1.2*  --   --   --   HGB 9.8*  < > 9.5* 10.3* 9.3* 8.0* 9.1* 11.1*  HCT 30.7*  < > 30.0* 32.1* 28.4* 24.1* 27.4* 33.4*  MCV 84.1  < > 85.7 86.5 85.5 85.5 83.8 82.9  PLT 85*  < > 84* 133* 128* 124* 158 245  < > = values in this interval not displayed. Cardiac Enzymes: No results for input(s): CKTOTAL, CKMB, CKMBINDEX, TROPONINI in the last 168 hours. BNP: Invalid input(s): POCBNP CBG:  Recent Labs Lab 05/02/14 2120 05/03/14 0723 05/03/14 1955  GLUCAP 106* 103* 147*    Recent Results (from the past 240 hour(s))  TECHNOLOGIST REVIEW     Status: None   Collection Time: 05/02/14  3:10 PM  Result Value Ref Range Status   Technologist Review Rare meta  Final  Urine culture     Status: None   Collection Time: 05/03/14 12:39 AM  Result Value Ref Range Status   Specimen Description URINE, RANDOM  Final   Special Requests NONE  Final   Colony Count NO GROWTH Performed at [facility]   Final   Culture NO GROWTH Performed at [facility]   Final   Report Status 05/04/2014 FINAL  Final  Anaerobic  culture     Status: None (Preliminary result)   Collection Time: 05/03/14  3:26 PM  Result Value Ref Range Status   Specimen Description PERITONEAL  Final   Special Requests NONE  Final   Gram Stain   Final    NO WBC SEEN RARE GRAM NEGATIVE RODS Performed at [facility]    Culture   Final    NO ANAEROBES ISOLATED; CULTURE IN PROGRESS FOR 5 DAYS Performed at [facility]    Report Status PENDING  Incomplete  MRSA PCR Screening     Status: None   Collection Time: 05/03/14  8:27 PM  Result Value Ref Range Status   MRSA by PCR NEGATIVE NEGATIVE Final    Comment:  The GeneXpert MRSA Assay (FDA approved for NASAL specimens only), is one component of a comprehensive MRSA colonization surveillance program. It is not intended to diagnose MRSA infection nor to guide or monitor treatment for MRSA infections.      Scheduled Meds: . sodium chloride   Intravenous Once  . sodium chloride   Intravenous Once  . sodium chloride  10 mL/hr Intravenous Once  . antiseptic oral rinse  7 mL Mouth Rinse BID  . ceFEPime (MAXIPIME) IV  2 g Intravenous Q8H  . hydrALAZINE  5 mg Intravenous Q6H  . metoprolol  5 mg Intravenous 4 times per day  . metronidazole  500 mg Intravenous Q8H  . pantoprazole (PROTONIX) IV  40 mg Intravenous Q24H  . sodium chloride  10 mL Intravenous Q12H   Continuous Infusions: . sodium chloride 1,000 mL (05/04/14 1800)  . sodium chloride 0.9 % 1,000 mL with potassium chloride 20 mEq infusion       Marylouise Mallet, DO  [facility] Pager [phone]  If 7PM-7AM, please contact night-coverage [email or web address] Password [email or web address] 05/07/2014, 3:49 PM   LOS: 5 days

## 2014-05-07 NOTE — Progress Notes (Signed)
Patient MV:EHMCNO E Umble      DOB: July 22, 1945      BSJ:[id number]   Palliative Medicine Team at [facility] Progress Note    Subjective: Post-op pain present.  Not tolerating morphine as he reports confusion/hallucinations.  Tolerating clears well. Ambulating halls.      Filed Vitals:   05/07/14 1200  BP: 142/79  Pulse: 91  Temp:   Resp: 27   Physical exam: GEN: alert, NAD HEENT: [place], sclera anicteric CV:RRR ABD: soft, +Colosotmy, c/d/i post-op wound dressing EXT: no edema  CBC    Component Value Date/Time   WBC 9.6 05/07/2014 0438   WBC 1.1* 05/02/2014 1510   RBC 4.03* 05/07/2014 0438   RBC 3.65* 05/02/2014 1510   HGB 11.1* 05/07/2014 0438   HGB 9.8* 05/02/2014 1510   HCT 33.4* 05/07/2014 0438   HCT 30.7* 05/02/2014 1510   PLT 245 05/07/2014 0438   PLT 85* 05/02/2014 1510   MCV 82.9 05/07/2014 0438   MCV 84.1 05/02/2014 1510   MCH 27.5 05/07/2014 0438   MCH 26.8* 05/02/2014 1510   MCHC 33.2 05/07/2014 0438   MCHC 31.9* 05/02/2014 1510   RDW 16.1* 05/07/2014 0438   RDW 17.2* 05/02/2014 1510   LYMPHSABS 0.2* 05/04/2014 0510   LYMPHSABS 0.1* 05/02/2014 1510   MONOABS 0.5 05/04/2014 0510   MONOABS 0.2 05/02/2014 1510   EOSABS 0.0 05/04/2014 0510   EOSABS 0.0 05/02/2014 1510   BASOSABS 0.0 05/04/2014 0510   BASOSABS 0.0 05/02/2014 1510    CMP     Component Value Date/Time   NA 131* 05/07/2014 0438   NA 136 05/02/2014 1511   K 4.0 05/07/2014 0438   K 4.0 05/02/2014 1511   CL 110 05/07/2014 0438   CO2 18* 05/07/2014 0438   CO2 22 05/02/2014 1511   GLUCOSE 96 05/07/2014 0438   GLUCOSE 145* 05/02/2014 1511   BUN 15 05/07/2014 0438   BUN 23.7 05/02/2014 1511   CREATININE 0.66 05/07/2014 0438   CREATININE 1.0 05/02/2014 1511   CALCIUM 7.5* 05/07/2014 0438   CALCIUM 8.4 05/02/2014 1511   PROT 5.5* 05/03/2014 0033   PROT 5.6* 05/02/2014 1511   ALBUMIN 2.2* 05/03/2014 0033   ALBUMIN 2.0* 05/02/2014 1511   AST 11 05/03/2014 0033   AST 9 05/02/2014 1511    ALT 9 05/03/2014 0033   ALT 9 05/02/2014 1511   ALKPHOS 139* 05/03/2014 0033   ALKPHOS 104 05/02/2014 1511   BILITOT 1.0 05/03/2014 0033   BILITOT 0.79 05/02/2014 1511   GFRNONAA >90 05/07/2014 0438   GFRAA >90 05/07/2014 0438       Assessment and plan: 68 yo male with PMHx of stage IV Colon CA, CAD who presented with abdominal pain, N/V and found to have perforated sigmoid colon with peritonitis  1. Code Status: DNR  2. Goals of Care: Doing very well post-op.  He has some questions about prognosis which we again covered.  Largely will depend on if he is able or chooses to pursue palliative chemo.  Will depend on his trajectory here. It does seem that his last chemo regimen was unacceptable to him.  Made him feel bad and did not have "good time" in between treatments.  He did have response on CT scan with smaller liver mets. Will need ongoing discussion with his Oncologist in future.  If he declines further cancer directed therapy, he would be home hospice candidate but suspect that decision will not be made until after hospitalization.  He would  benefit from outpatient palliative care consultation if this can be arranged at d/c.     3. Symptom Management:  1. Post-Op Pain: did not tolerate morphine and states he wont take. Was on oxycodone at home and I agree with switch to percocet.  I will increase frequency to q3h PRN to see if he can avoid IV pain meds all together.  Will have equivalent dose of IV dilaudid as backup and dc IV morphine order.      Total Time: 25 minutes >50% of time spent in counseling and coordination of care regarding above.   Doran Clay D.O. Palliative Medicine Team at [facility]  Pager: [phone] Team Phone: [phone]

## 2014-05-07 NOTE — Progress Notes (Signed)
4 Days Post-Op  Subjective:  He is fairly tender and sore, just had something for pain and is getting his bath.  He reports he was fairly weak prior to colon perforation.  He has just a little liquid stool in his ostomy bag, no gas.  Open wound looks fine.    Objective: Vital signs in last 24 hours: Temp:  [97.6 F (36.4 C)-98.2 F (36.8 C)] 98.2 F (36.8 C) (12/28 0800) Pulse Rate:  [67-102] 81 (12/28 0600) Resp:  [21-35] 28 (12/28 0600) BP: (114-203)/(67-128) 137/85 mmHg (12/28 0904) SpO2:  [95 %-98 %] 96 % (12/28 0600) Weight:  [93.7 kg (206 lb 9.1 oz)] 93.7 kg (206 lb 9.1 oz) (12/28 0400) Last BM Date:  (pta ) 50 from the ostomy Afebrile, VSS Na 131 H/H is stable.   Intake/Output from previous day: 12/27 0701 - 12/28 0700 In: 2415 [I.V.:1965; IV Piggyback:450] Out: 2775 [Urine:2725; Stool:50] Intake/Output this shift:    General appearance: alert, cooperative and no distress Resp: clear to auscultation bilaterally and anterior GI: soft, very tender, and sore, few BS, just a little stool like liquid in the Ostomy bag.  Lab Results:   Recent Labs  05/06/14 0840 05/07/14 0438  WBC 4.7 9.6  HGB 9.1* 11.1*  HCT 27.4* 33.4*  PLT 158 245    BMET  Recent Labs  05/06/14 0840 05/07/14 0438  NA 130* 131*  K 4.5 4.0  CL 111 110  CO2 17* 18*  GLUCOSE 239* 96  BUN 19 15  CREATININE 0.71 0.66  CALCIUM 7.2* 7.5*   PT/INR No results for input(s): LABPROT, INR in the last 72 hours.   Recent Labs Lab 05/02/14 1511 05/03/14 0033  AST 9 11  ALT 9 9  ALKPHOS 104 139*  BILITOT 0.79 1.0  PROT 5.6* 5.5*  ALBUMIN 2.0* 2.2*     Lipase     Component Value Date/Time   LIPASE <10* 05/03/2014 0130     Studies/Results: No results found.  Medications: . sodium chloride   Intravenous Once  . sodium chloride   Intravenous Once  . sodium chloride  10 mL/hr Intravenous Once  . antiseptic oral rinse  7 mL Mouth Rinse BID  . ceFEPime (MAXIPIME) IV  2 g  Intravenous Q8H  . hydrALAZINE  5 mg Intravenous Q6H  . metoprolol  5 mg Intravenous 4 times per day  . metronidazole  500 mg Intravenous Q8H  . morphine   Intravenous 6 times per day  . pantoprazole (PROTONIX) IV  40 mg Intravenous Q24H  . sodium chloride  10 mL Intravenous Q12H   . sodium chloride 1,000 mL (05/04/14 1800)    Assessment/Plan 1.  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 Has been on active chemotx supervised by Dr. Wanda Plump. [name] [name].  2.  . SIGMOID AND LEFT COLON COLECTOMY, END COLOSTOMY, MOBILIZATION OF SPLENIC FLEXURE - 05/03/2014 - D. Japneet Staggs For perforated sigmoid colon at site of colon cancer -   3. History of CAD 4. Leukopenia - Improving  WBC - 9,600 - 05/07/2014 5. Malnutrition 6. HTN 7. Anemia - Hgb - 11.1 - post transfusion  Plan:  OOB,IS, ask PT to start seeing, I am going to give him some sips of clears and go slowly.  See how he does.    His IV's are just KVO, i wonder if we don't need to increase that some,  but will defer to Dr. Carles Collet.    LOS: 5 days    [name],[name] 05/07/2014  Agree with above. Colostomy functioning.  Can advance diet. Complains of some confusion with meds.  Alphonsa Overall, MD, [facility] Pager: [phone] Office phone:  [phone]

## 2014-05-08 ENCOUNTER — Ambulatory Visit: Payer: Medicare Other

## 2014-05-08 ENCOUNTER — Ambulatory Visit: Payer: Medicare Other | Admitting: Oncology

## 2014-05-08 ENCOUNTER — Other Ambulatory Visit: Payer: Medicare Other

## 2014-05-08 LAB — BASIC METABOLIC PANEL
ANION GAP: 5 (ref 5–15)
BUN: 16 mg/dL (ref 6–23)
CALCIUM: 7.4 mg/dL — AB (ref 8.4–10.5)
CO2: 17 mmol/L — AB (ref 19–32)
Chloride: 110 mEq/L (ref 96–112)
Creatinine, Ser: 0.88 mg/dL (ref 0.50–1.35)
GFR calc Af Amer: >90 mL/min (ref >90)
GFR, EST NON AFRICAN AMERICAN: 85 mL/min — AB (ref >90)
Glucose, Bld: 89 mg/dL (ref 70–99)
Potassium: 4.2 mmol/L (ref 3.5–5.1)
Sodium: 132 mmol/L — ABNORMAL LOW (ref 135–145)

## 2014-05-08 LAB — ANAEROBIC CULTURE: GRAM STAIN: NONE SEEN

## 2014-05-08 LAB — CBC
HCT: 31.5 % — ABNORMAL LOW (ref 39.0–52.0)
Hemoglobin: 10.2 g/dL — ABNORMAL LOW (ref 13.0–17.0)
MCH: 27.3 pg (ref 26.0–34.0)
MCHC: 32.4 g/dL (ref 30.0–36.0)
MCV: 84.5 fL (ref 78.0–100.0)
Platelets: 228 10*3/uL (ref 150–400)
RBC: 3.73 MIL/uL — ABNORMAL LOW (ref 4.22–5.81)
RDW: 16.7 % — ABNORMAL HIGH (ref 11.5–15.5)
WBC: 11.6 10*3/uL — ABNORMAL HIGH (ref 4.0–10.5)

## 2014-05-08 MED ORDER — ENOXAPARIN SODIUM 40 MG/0.4ML ~~LOC~~ SOLN
40.0000 mg | SUBCUTANEOUS | Status: DC
  Administered 2014-05-08 – 2014-05-15 (×8): 40 mg via SUBCUTANEOUS
  Filled 2014-05-08 (×10): qty 0.4

## 2014-05-08 MED ORDER — SODIUM CHLORIDE 0.9 % IV BOLUS (SEPSIS)
500.0000 mL | Freq: Once | INTRAVENOUS | Status: AC
  Administered 2014-05-08: 500 mL via INTRAVENOUS

## 2014-05-08 NOTE — Evaluation (Signed)
Physical Therapy Evaluation Patient Details Name: Frank [name] MRN: [id number] DOB: 08/01/1945 Today's Date: 05/08/2014   History of Present Illness  Abdominal exploration with distal left colon and sigmoid  Clinical Impression  Pt admitted with dx of colon perforation s/p colectomy/colostomy 05/03/14 and presenting with functional mobility limitations 2* generalized weakness, ambulatory balance deficits and post op pain.  Pt reports available 24/7 assist and plans d/c home.  Pt will benefit from follow up HHPT     Follow Up Recommendations Home health PT    Equipment Recommendations  Rolling walker with 5" wheels    Recommendations for Other Services OT consult     Precautions / Restrictions Precautions Precautions: Fall Precaution Comments: s/p abd surgery Restrictions Weight Bearing Restrictions: No      Mobility  Bed Mobility Overal bed mobility: Needs Assistance Bed Mobility: Supine to Sit     Supine to sit: Mod assist     General bed mobility comments: cues for log roll technique and physical assist to bring to sitting  Transfers Overall transfer level: Needs assistance Equipment used: Rolling walker (2 wheeled) Transfers: Sit to/from Stand Sit to Stand: Min assist;Mod assist;+2 physical assistance;+2 safety/equipment;From elevated surface         General transfer comment: cues for transition position and use of UEs to self assist  Ambulation/Gait Ambulation/Gait assistance: Min assist Ambulation Distance (Feet): 56 Feet (and 43) Assistive device: Rolling walker (2 wheeled) Gait Pattern/deviations: Step-to pattern;Step-through pattern;Decreased step length - right;Decreased step length - left;Shuffle;Trunk flexed Gait velocity: decreased   General Gait Details: cues for posture and position from RW.  Several short rests required to complete task  Stairs            Wheelchair Mobility    Modified Rankin (Stroke Patients Only)        Balance Overall balance assessment: Needs assistance Sitting-balance support: Feet supported Sitting balance-Leahy Scale: Fair     Standing balance support: Bilateral upper extremity supported Standing balance-Leahy Scale: Fair                               Pertinent Vitals/Pain Pain Assessment: 0-10 Pain Score: 6  Pain Location: abdominal Pain Descriptors / Indicators: Sore Pain Intervention(s): Limited activity within patient's tolerance;Monitored during session    Home Living Family/patient expects to be discharged to:: Private residence Living Arrangements: Spouse/significant other Available Help at Discharge: Family Type of Home: House Home Access: Level entry     Home Layout: Two level Home Equipment: None Additional Comments: Pt states he could stay on main level if absolutely needed but all bedrooms are upstairs    Prior Function Level of Independence: Independent               Hand Dominance   Dominant Hand: Right    Extremity/Trunk Assessment   Upper Extremity Assessment: Generalized weakness           Lower Extremity Assessment: Generalized weakness      Cervical / Trunk Assessment: Normal  Communication   Communication: No difficulties  Cognition Arousal/Alertness: Awake/alert Behavior During Therapy: WFL for tasks assessed/performed Overall Cognitive Status: Within Functional Limits for tasks assessed                      General Comments      Exercises        Assessment/Plan    PT Assessment Patient needs continued PT services  PT  Diagnosis Difficulty walking   PT Problem List Decreased strength;Decreased range of motion;Decreased activity tolerance;Decreased mobility;Decreased knowledge of use of DME;Pain;Obesity;Decreased balance  PT Treatment Interventions DME instruction;Gait training;Stair training;Functional mobility training;Therapeutic activities;Therapeutic exercise;Patient/family education    PT Goals (Current goals can be found in the Care Plan section) Acute Rehab PT Goals Patient Stated Goal: HOME PT Goal Formulation: With patient Time For Goal Achievement: 05/22/14 Potential to Achieve Goals: Fair    Frequency Min 3X/week   Barriers to discharge        Co-evaluation               End of Session   Activity Tolerance: Patient limited by fatigue;Patient limited by pain Patient left: in chair;with call bell/phone within reach;with family/visitor present Nurse Communication: Mobility status         Time: 2202-5427 PT Time Calculation (min) (ACUTE ONLY): 33 min   Charges:   PT Evaluation $Initial PT Evaluation Tier I: 1 Procedure PT Treatments $Gait Training: 23-37 mins   PT G Codes:        Mikia Delaluz Jun 07, 2014, 4:48 PM

## 2014-05-08 NOTE — Progress Notes (Signed)
Was asked by wife/patient to return to room this afternoon. They wanted to talk about home hospice as an option.  I told them that home hospice would come with decision of not pursuing chemotherapy which they understood.  I discussed possibility of talking with Dr [name] about alternative chemo regimen, reduced dose regimens to see if he could tolerate better, but they do not wish to pursue this.  When medically stable for d/c, they would like to go home with home hospice care.  I have placed case management referral to help arrange. Will look to primary and surgical teams for guidance on when medically appropriate for discharge.  Prognosis certainly expected to be 6 months or less if disease follows expected trajectory.    Doran Clay D.O. Palliative Medicine Team at [facility]  Pager: [phone] Team Phone: [phone]

## 2014-05-08 NOTE — Progress Notes (Signed)
05/08/14 0754  Notified charge RN that patient has CVP due at 0800 and 1400.  Charge RN will monitor CVP today.

## 2014-05-08 NOTE — Progress Notes (Signed)
ANTICOAGULATION CONSULT NOTE - Initial Consult  Pharmacy Consult for Lovenox Indication: VTE prophylaxis  Allergies  Allergen Reactions  . Simvastatin Other (See Comments)    "Muscle aches"    Patient Measurements: Height: 5\' 9"  (175.3 cm) Weight: 205 lb 7.5 oz (93.2 kg) IBW/kg (Calculated) : 70.7  Vital Signs: Temp: 97.8 F (36.6 C) (12/29 0500) Temp Source: Oral (12/29 0500) BP: 129/86 mmHg (12/29 0600) Pulse Rate: 77 (12/29 0600)  Labs:  Recent Labs  05/06/14 0840 05/07/14 0438 05/08/14 0200  HGB 9.1* 11.1* 10.2*  HCT 27.4* 33.4* 31.5*  PLT 158 245 228  CREATININE 0.71 0.66 0.88    Estimated Creatinine Clearance: 88.1 mL/min (by C-G formula based on Cr of 0.88).   Medical History: Past Medical History  Diagnosis Date  . Hyperlipidemia   . Hypertension   . Ischemic heart disease   . Obesity   . Difficult intubation 05/03/2014    anterior airway accessed by Glidescope    Medications:  Scheduled:  . sodium chloride   Intravenous Once  . sodium chloride   Intravenous Once  . sodium chloride  10 mL/hr Intravenous Once  . antiseptic oral rinse  7 mL Mouth Rinse BID  . ceFEPime (MAXIPIME) IV  2 g Intravenous Q8H  . hydrALAZINE  5 mg Intravenous Q12H  . metoprolol  5 mg Intravenous 4 times per day  . metronidazole  500 mg Intravenous Q8H  . pantoprazole (PROTONIX) IV  40 mg Intravenous Q24H  . sodium chloride  10 mL Intravenous Q12H    Assessment: 68yo M known to pharmacy from antibiotic dosing. Now POD #5. Pharmacy is asked to start Lovenox for DVT prophylaxis. H/H low but stable. Pltc wnl. SCr wnl.   Plan:  Start Lovenox 40mg  SQ q24h.  Dose adjustments are not likely to be needed so pharmacy will sign-off.  Romeo Rabon, PharmD, pager [phone]. 05/08/2014,8:12 AM.

## 2014-05-08 NOTE — Progress Notes (Addendum)
PROGRESS NOTE  Frank [name] DGU:[id number] DOB: 1945-12-13 DOA: 05/02/2014 PCP: [name], MD  Assessment/Plan: abd pain, nausea, vomiting: due to bowel perforation and peritonitis -continue PRN antiemetics and analgesics -continue IV antibiotics for now; no fever-->change to po when able to tolerate po -s/p hemicolectomy with end colostomy 05/03/14 -making stool and gas in ostomy -appreciate general surgery  -started sips on 05/06/14; NGT removed on 12/25 -05/07/14--started clears-->tolerating -CVP = 0-4-->increase IVF -switch to oral abx when able to reliably tolerate diet -move out of ICU/Stepdown if okay with surgery  L-Upper extremity edema -venous duplex r/o DVT  tachycardia: due to dehydration, blood loss and pain -continue IV metoprolol (to prevent rebound) until able to reliably take po -improved  pancytopenia:  -due to ongoing chemotherapy-->improving with time -restart Lovenox -counts recovering -appreciate Dr. [name] -loss almost 1000ML of blood during surgery, received 2 units PRBC's -current Hgb 8.0 (goal given hx of CAD and CABG is hgb > 8)  Uncontrolled HTN:  -Increase Lopressor to 5 mg IV every 6 hours -decrease hydralazine to bid -will follow VS  chronic diastolic heart failure -compensated at this point -will continue holding lasix--CVP 0-4 -daily weights and strict intake/output -continue low dose b-blocker ordered  hx of CAD:  s/p CABG in the past -no CP or SOB. -continue B-blocker -will monitor.  metastatic colon cancer:  -per oncology service. -mets to liver  Goals of Care -appreciate Dr. [name] -plan to go home with hospice care once medically optimzed -no further plans for chemo  Code Status: DNR Family Communication: daughter and wife updated at bedside         Procedures/Studies: Ct Head Wo Contrast  05/02/2014   CLINICAL DATA:  Increased confusion today with nausea and vomiting  EXAM: CT HEAD  WITHOUT CONTRAST  TECHNIQUE: Contiguous axial images were obtained from the base of the skull through the vertex without intravenous contrast.  COMPARISON:  None  FINDINGS: Advanced atrophy with prominence of the bifrontal extra-axial spaces. Gray-white differentiation is maintained. No CT evidence of acute large territory infarct. No intraparenchymal extra-axial mass or hemorrhage. Unchanged size and configuration of the ventricles and basilar cisterns. No midline shift.  Limited visualization of the paranasal sinuses and mastoid air cells are normal. No air-fluid level. There is potential enlargement of the sella (image 9, series 3).  Regional soft tissues appear normal. Paranasal sinuses and mastoid air cells are normal. No displaced calvarial fracture.  IMPRESSION: 1. Advanced atrophy without acute intracranial process. 2. Nonspecific potential enlargement of the sella. Further evaluation could be performed with nonemergent brain MRI as clinically indicated.   Electronically Signed   By: [name] M.D.   On: 05/02/2014 21:51   Ct Abdomen Pelvis W Contrast  05/02/2014   CLINICAL DATA:  Mid abdominal pain, nausea and vomiting, after chemotherapy. History of colon cancer.  EXAM: CT ABDOMEN AND PELVIS WITH CONTRAST  TECHNIQUE: Multidetector CT imaging of the abdomen and pelvis was performed using the standard protocol following bolus administration of intravenous contrast.  CONTRAST:  175mL OMNIPAQUE IOHEXOL 300 MG/ML  SOLN  COMPARISON:  PET-CT 03/07/2014, CT abdomen/ pelvis 02/22/2014  FINDINGS: Evidence of CABG partly visualized. Trace pleural effusions are noted with associated bibasilar linear presumed atelectasis.  A few foci of free intraperitoneal air are identified image 19. Epicardial nodes are identified, 0.6 cm diameter image 12.  Innumerable hepatic masses are reidentified with overall nodular liver contour suggesting underlying cirrhosis. Representative posterior segment  right hepatic lobe mass is  slightly smaller, now 5.4 x 4.7 cm image 22, previously 6.8 x 5.8 cm.  Porta hepatis node is stable measuring 0.9 cm image 30. Stable subcentimeter periaortic lymph nodes.  There are numerous loops of small bowel with diameter at upper limits of normal, representative 3.1 cm diameter loop of bowel within the left upper quadrant image 32, but which demonstrate abnormal wall thickening. Within the colon, the previously seen area of masslike sigmoid wall thickening is reidentified, image 71. There is a new rim enhancing gas/ fluid collection compatible with abscess from colonic perforation image 70, measuring 3.5 x 3.3 cm. Foci of gas are noted tracking throughout the mesentery as well. Large volume ascites is noted with interval development of rim enhancement suggesting superimposed infection, for example image 72. Pericolonic nodes are reidentified, representative 0.6 cm node image 64. There is increased pericolonic stranding. Increased stranding and fluid infiltration of the mesentery/omentum is noted without measurable nodularity.  Aortic atheromatous calcification without aneurysm. Fat containing left inguinal hernia noted. The bladder is unremarkable.  Evidence of right L5 laminectomy. Multilevel lumbar spine disc degenerative change noted. No focal osseous lesion is identified. The bones are subjectively mildly sclerotic, unchanged, raising the question of possible metastatic infiltration although this could be a normal variant.  IMPRESSION: Interval sigmoid colonic perforation adjacent to the previously seen masslike wall thickening compatible with primary colonic carcinoma and intra-abdominal abscess formation.  Small bowel wall thickening may suggest secondary enteritis. No evidence for small bowel obstruction at this time.  Diffusely rim enhancing large volume ascites and mesenteric infiltration suggesting peritonitis.  Decrease in size of hepatic representative metastatic lesion.  Critical Value/emergent  results were called by telephone at the time of interpretation on 05/02/2014 at 11:38 pm to [facility] for Dr. Berle Mull , who verbally acknowledged these results.   Electronically Signed   By: Conchita Paris M.D.   On: 05/02/2014 23:40   Dg Chest Port 1 View  05/03/2014   CLINICAL DATA:  Central line placement.  EXAM: PORTABLE CHEST - 1 VIEW  COMPARISON:  02/22/2014  FINDINGS: Sternotomy wires are present. Nasogastric tube has tip and side-port over the stomach in the left upper quadrant. Left IJ central venous catheter has tip in the region of the cavoatrial junction. Right IJ central venous catheter has tip over the region of the SVC.  Lungs are hypoinflated with mild linear density in the left base likely atelectasis. There is subtle density in the medial right base likely atelectasis. No definite effusion or pneumothorax. Cardiomediastinal silhouette and remainder of the exam is unchanged.  IMPRESSION: Hypoinflation with mild bibasilar opacification likely atelectasis.  Tubes and lines as described.  No definite pneumothorax.   Electronically Signed   By: Marin Olp M.D.   On: 05/03/2014 18:53         Subjective: Patient continues to complain of abdominal pain without worsening. Denies any fevers, chills, chest pain, nausea, vomiting, dysuria. He is making stool in the ostomy.   Objective: Filed Vitals:   05/08/14 1500 05/08/14 1600 05/08/14 1617 05/08/14 1700  BP: 111/76 125/85  94/72  Pulse: 124 122  111  Temp:   97.6 F (36.4 C)   TempSrc:   Oral   Resp: 25 25  25   Height:      Weight:      SpO2: 98% 100%  100%    Intake/Output Summary (Last 24 hours) at 05/08/14 1821 Last data filed at 05/08/14 1616  Gross  per 24 hour  Intake   2135 ml  Output   1255 ml  Net    880 ml   Weight change: -0.5 kg (-1 lb 1.6 oz) Exam:   General:  Pt is alert, follows commands appropriately, not in acute distress  HEENT: No icterus, No thrush,[place]/AT  Cardiovascular: RRR, S1/S2, no  rubs, no gallops  Respiratory: CTA bilaterally, no wheezing, no crackles, no rhonchi  Abdomen: Soft/+BS, non tender, non distended, no guarding  Extremities: LUE edema, No lymphangitis, No petechiae, No rashes, no synovitis  Data Reviewed: Basic Metabolic Panel:  Recent Labs Lab 05/04/14 0510 05/05/14 0425 05/06/14 0840 05/07/14 0438 05/08/14 0200  NA 136 131* 130* 131* 132*  K 4.0 4.1 4.5 4.0 4.2  CL 108 111 111 110 110  CO2 20 17* 17* 18* 17*  GLUCOSE 267* 229* 239* 96 89  BUN 33* 27* 19 15 16   CREATININE 1.20 0.87 0.71 0.66 0.88  CALCIUM 7.6* 7.2* 7.2* 7.5* 7.4*  MG  --  1.7  --   --   --    Liver Function Tests:  Recent Labs Lab 05/02/14 1511 05/03/14 0033  AST 9 11  ALT 9 9  ALKPHOS 104 139*  BILITOT 0.79 1.0  PROT 5.6* 5.5*  ALBUMIN 2.0* 2.2*    Recent Labs Lab 05/03/14 0130  LIPASE <10*    Recent Labs Lab 05/03/14 0130  AMMONIA 17   CBC:  Recent Labs Lab 05/02/14 1510  05/03/14 0033 05/03/14 2105 05/04/14 0510 05/05/14 0425 05/06/14 0840 05/07/14 0438 05/08/14 0200  WBC 1.1*  < > 1.1* 2.3* 1.9* 2.3* 4.7 9.6 11.6*  NEUTROABS 0.9*  --  0.8* 1.6* 1.2*  --   --   --   --   HGB 9.8*  < > 9.5* 10.3* 9.3* 8.0* 9.1* 11.1* 10.2*  HCT 30.7*  < > 30.0* 32.1* 28.4* 24.1* 27.4* 33.4* 31.5*  MCV 84.1  < > 85.7 86.5 85.5 85.5 83.8 82.9 84.5  PLT 85*  < > 84* 133* 128* 124* 158 245 228  < > = values in this interval not displayed. Cardiac Enzymes: No results for input(s): CKTOTAL, CKMB, CKMBINDEX, TROPONINI in the last 168 hours. BNP: Invalid input(s): POCBNP CBG:  Recent Labs Lab 05/02/14 2120 05/03/14 0723 05/03/14 1955  GLUCAP 106* 103* 147*    Recent Results (from the past 240 hour(s))  TECHNOLOGIST REVIEW     Status: None   Collection Time: 05/02/14  3:10 PM  Result Value Ref Range Status   Technologist Review Rare meta  Final  Urine culture     Status: None   Collection Time: 05/03/14 12:39 AM  Result Value Ref Range Status    Specimen Description URINE, RANDOM  Final   Special Requests NONE  Final   Colony Count NO GROWTH Performed at [facility]   Final   Culture NO GROWTH Performed at [facility]   Final   Report Status 05/04/2014 FINAL  Final  Anaerobic culture     Status: None   Collection Time: 05/03/14  3:26 PM  Result Value Ref Range Status   Specimen Description PERITONEAL  Final   Special Requests NONE  Final   Gram Stain   Final    NO WBC SEEN RARE GRAM NEGATIVE RODS Performed at [facility]    Culture   Final    NO ANAEROBES ISOLATED Performed at [facility]    Report Status 05/08/2014 FINAL  Final  MRSA  PCR Screening     Status: None   Collection Time: 05/03/14  8:27 PM  Result Value Ref Range Status   MRSA by PCR NEGATIVE NEGATIVE Final    Comment:        The GeneXpert MRSA Assay (FDA approved for NASAL specimens only), is one component of a comprehensive MRSA colonization surveillance program. It is not intended to diagnose MRSA infection nor to guide or monitor treatment for MRSA infections.      Scheduled Meds: . sodium chloride   Intravenous Once  . sodium chloride   Intravenous Once  . sodium chloride  10 mL/hr Intravenous Once  . antiseptic oral rinse  7 mL Mouth Rinse BID  . ceFEPime (MAXIPIME) IV  2 g Intravenous Q8H  . enoxaparin (LOVENOX) injection  40 mg Subcutaneous Q24H  . hydrALAZINE  5 mg Intravenous Q12H  . metoprolol  5 mg Intravenous 4 times per day  . metronidazole  500 mg Intravenous Q8H  . pantoprazole (PROTONIX) IV  40 mg Intravenous Q24H  . sodium chloride  10 mL Intravenous Q12H   Continuous Infusions: . sodium chloride 1,000 mL (05/04/14 1800)  . sodium chloride 0.9 % 1,000 mL with potassium chloride 20 mEq infusion 125 mL/hr at 05/08/14 1520     Bryleigh Ottaway, DO  [facility] Pager [phone]  If 7PM-7AM, please contact night-coverage [email or web address] Password [email or web address] 05/08/2014, 6:21 PM   LOS: 6  days

## 2014-05-08 NOTE — Progress Notes (Signed)
5 Days Post-Op  Subjective: He looks much better taking some clears, moving better in bed.  More gas in ostomy bag.  Still liquid no real fecal material in the ostomy bag this AM.    Objective: Vital signs in last 24 hours: Temp:  [97.4 F (36.3 C)-98.2 F (36.8 C)] 97.8 F (36.6 C) (12/29 0500) Pulse Rate:  [77-121] 77 (12/29 0600) Resp:  [18-28] 21 (12/29 0600) BP: (98-153)/(63-90) 129/86 mmHg (12/29 0600) SpO2:  [84 %-100 %] 100 % (12/29 0600) Weight:  [93.2 kg (205 lb 7.5 oz)] 93.2 kg (205 lb 7.5 oz) (12/29 0500) Last BM Date: 05/07/14 (colostomy) Nothing PO recorded.  Clear diet Nothing recorded from ostomy  Afebrile, VSS Na 132, K+4.2 WBC 11.6 H/H is stable Intake/Output from previous day: 12/28 0701 - 12/29 0700 In: 1777.5 [I.V.:1327.5; IV Piggyback:450] Out: 1130 [Urine:1130] Intake/Output this shift:    General appearance: alert, cooperative and no distress Resp: clear to auscultation bilaterally GI: soft, tender, pain better with oxycodone.  few BS, open wound looks good. black colored liquid and gas only in ostomy bag.  Lab Results:   Recent Labs  05/07/14 0438 05/08/14 0200  WBC 9.6 11.6*  HGB 11.1* 10.2*  HCT 33.4* 31.5*  PLT 245 228    BMET  Recent Labs  05/07/14 0438 05/08/14 0200  NA 131* 132*  K 4.0 4.2  CL 110 110  CO2 18* 17*  GLUCOSE 96 89  BUN 15 16  CREATININE 0.66 0.88  CALCIUM 7.5* 7.4*   PT/INR No results for input(s): LABPROT, INR in the last 72 hours.   Recent Labs Lab 05/02/14 1511 05/03/14 0033  AST 9 11  ALT 9 9  ALKPHOS 104 139*  BILITOT 0.79 1.0  PROT 5.6* 5.5*  ALBUMIN 2.0* 2.2*     Lipase     Component Value Date/Time   LIPASE <10* 05/03/2014 0130     Studies/Results: No results found.  Medications: . sodium chloride   Intravenous Once  . sodium chloride   Intravenous Once  . sodium chloride  10 mL/hr Intravenous Once  . antiseptic oral rinse  7 mL Mouth Rinse BID  . ceFEPime (MAXIPIME) IV   2 g Intravenous Q8H  . hydrALAZINE  5 mg Intravenous Q12H  . metoprolol  5 mg Intravenous 4 times per day  . metronidazole  500 mg Intravenous Q8H  . pantoprazole (PROTONIX) IV  40 mg Intravenous Q24H  . sodium chloride  10 mL Intravenous Q12H    Assessment/Plan 1. 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 Has been on active chemotx supervised by Dr. Wanda Plump. [name].  2. . SIGMOID AND LEFT COLON COLECTOMY, END COLOSTOMY, MOBILIZATION OF SPLENIC FLEXURE - 05/03/2014 - D. Newman For perforated sigmoid colon at site of colon cancer -   3. History of CAD 4. Leukopenia - Improving WBC - 9,600 - 05/07/2014 5. Malnutrition 6. HTN 7. Anemia - Hgb - 11.1 - post transfusion   Plan:  Leave in SDU one more day, he is still tachycardic, but moving more and feels much better.  i will start lovenox for DVT prophylaxis.      LOS: 6 days    Frank [name] 05/08/2014

## 2014-05-08 NOTE — Progress Notes (Signed)
Patient Frank [name]      DOB: 1945-12-06      CXK:[id number]   Palliative Medicine Team at [facility] Progress Note    Subjective: Pain doing much better.  Taking fairly frequent PRN percocet but controlling pain well.  Tolerating clears.  Ambulating. Denies N/V, dyspnea.      Filed Vitals:   05/08/14 0800  BP:   Pulse:   Temp: 97.7 F (36.5 C)  Resp:    Physical exam: GEN: alert, NAD HEENT: [place], sclera anicteric, mmm CV: RRR LUNGS: CTAB ABD: soft, ND, +colostomy   CBC    Component Value Date/Time   WBC 11.6* 05/08/2014 0200   WBC 1.1* 05/02/2014 1510   RBC 3.73* 05/08/2014 0200   RBC 3.65* 05/02/2014 1510   HGB 10.2* 05/08/2014 0200   HGB 9.8* 05/02/2014 1510   HCT 31.5* 05/08/2014 0200   HCT 30.7* 05/02/2014 1510   PLT 228 05/08/2014 0200   PLT 85* 05/02/2014 1510   MCV 84.5 05/08/2014 0200   MCV 84.1 05/02/2014 1510   MCH 27.3 05/08/2014 0200   MCH 26.8* 05/02/2014 1510   MCHC 32.4 05/08/2014 0200   MCHC 31.9* 05/02/2014 1510   RDW 16.7* 05/08/2014 0200   RDW 17.2* 05/02/2014 1510   LYMPHSABS 0.2* 05/04/2014 0510   LYMPHSABS 0.1* 05/02/2014 1510   MONOABS 0.5 05/04/2014 0510   MONOABS 0.2 05/02/2014 1510   EOSABS 0.0 05/04/2014 0510   EOSABS 0.0 05/02/2014 1510   BASOSABS 0.0 05/04/2014 0510   BASOSABS 0.0 05/02/2014 1510    CMP     Component Value Date/Time   NA 132* 05/08/2014 0200   NA 136 05/02/2014 1511   K 4.2 05/08/2014 0200   K 4.0 05/02/2014 1511   CL 110 05/08/2014 0200   CO2 17* 05/08/2014 0200   CO2 22 05/02/2014 1511   GLUCOSE 89 05/08/2014 0200   GLUCOSE 145* 05/02/2014 1511   BUN 16 05/08/2014 0200   BUN 23.7 05/02/2014 1511   CREATININE 0.88 05/08/2014 0200   CREATININE 1.0 05/02/2014 1511   CALCIUM 7.4* 05/08/2014 0200   CALCIUM 8.4 05/02/2014 1511   PROT 5.5* 05/03/2014 0033   PROT 5.6* 05/02/2014 1511   ALBUMIN 2.2* 05/03/2014 0033   ALBUMIN 2.0* 05/02/2014 1511   AST 11 05/03/2014 0033   AST 9 05/02/2014  1511   ALT 9 05/03/2014 0033   ALT 9 05/02/2014 1511   ALKPHOS 139* 05/03/2014 0033   ALKPHOS 104 05/02/2014 1511   BILITOT 1.0 05/03/2014 0033   BILITOT 0.79 05/02/2014 1511   GFRNONAA 85* 05/08/2014 0200   GFRAA >90 05/08/2014 0200        Assessment and plan: 68 yo male with PMHx of stage IV Colon CA, CAD who presented with abdominal pain, N/V and found to have perforated sigmoid colon with peritonitis  1. Code Status: DNR  2. Goals of Care: Doing very well post-op. He has some questions about prognosis which we again covered. Largely will depend on if he is able or chooses to pursue palliative chemo. Will depend on his trajectory here. It does seem that his last chemo regimen was unacceptable to him. Made him feel bad and did not have "good time" in between treatments. He did have response on CT scan with smaller liver mets. Will need ongoing discussion with his Oncologist in future. If he declines further cancer directed therapy, he would be home hospice candidate but suspect that decision will not be made until after hospitalization. He  would benefit from outpatient palliative care consultation if this can be arranged at d/c.    3. Symptom Management:  1. Post-Op Pain: continue PRN percocet. This is controlling pain. While needing frequent doses would not add long acting medicine as I expect post-op pain to continue to improve.      Doran Clay D.O. Palliative Medicine Team at [facility]  Pager: [phone] Team Phone: [phone]

## 2014-05-08 NOTE — Consult Note (Addendum)
WOC ostomy follow up Stoma type/location: LLQ colostomy Stomal assessment/size: visualized through pouch, red, moist. Peristomal assessment: Not seen today Treatment options for stomal/peristomal skin: None indicated Output: thin, dark brown effluent Ostomy pouching: 2pc. (My partner applied a 2-piece 2 and 1/4 inch pouching system yesterday, but patient is wearing a 2 and 3/4 inch pouching system today.  He cannot tell me if it was changed last night.  It is intact and I instructed him that I will likely change again tomorrow after he transfers to the floor or the following day. Education provided: Educational booklets provided to patient and also to patient's son.  I have also provided an [place] catalog per the patient's wife's request to my partner yesterday. Enrolled patient in [place] Start Discharge program: No WOC nursing team will follow, andwill remain available to this patient, the nursing, srugical and medical teams.   Thanks, Maudie Flakes, MSN, RN, [place], [place], [place] [phone])

## 2014-05-09 DIAGNOSIS — M7989 Other specified soft tissue disorders: Secondary | ICD-10-CM

## 2014-05-09 DIAGNOSIS — M79602 Pain in left arm: Secondary | ICD-10-CM

## 2014-05-09 LAB — CBC
HEMATOCRIT: 34.4 % — AB (ref 39.0–52.0)
Hemoglobin: 11.1 g/dL — ABNORMAL LOW (ref 13.0–17.0)
MCH: 27.5 pg (ref 26.0–34.0)
MCHC: 32.3 g/dL (ref 30.0–36.0)
MCV: 85.4 fL (ref 78.0–100.0)
PLATELETS: 170 10*3/uL (ref 150–400)
RBC: 4.03 MIL/uL — ABNORMAL LOW (ref 4.22–5.81)
RDW: 17.2 % — ABNORMAL HIGH (ref 11.5–15.5)
WBC: 8.6 10*3/uL (ref 4.0–10.5)

## 2014-05-09 LAB — BASIC METABOLIC PANEL
ANION GAP: 4 — AB (ref 5–15)
BUN: 16 mg/dL (ref 6–23)
CHLORIDE: 114 meq/L — AB (ref 96–112)
CO2: 17 mmol/L — ABNORMAL LOW (ref 19–32)
Calcium: 7.3 mg/dL — ABNORMAL LOW (ref 8.4–10.5)
Creatinine, Ser: 0.76 mg/dL (ref 0.50–1.35)
GFR calc non Af Amer: >90 mL/min (ref >90)
Glucose, Bld: 81 mg/dL (ref 70–99)
POTASSIUM: 3.8 mmol/L (ref 3.5–5.1)
Sodium: 135 mmol/L (ref 135–145)

## 2014-05-09 MED ORDER — POTASSIUM CHLORIDE IN NACL 20-0.9 MEQ/L-% IV SOLN
INTRAVENOUS | Status: DC
  Administered 2014-05-09 – 2014-05-10 (×3): via INTRAVENOUS
  Filled 2014-05-09 (×7): qty 1000

## 2014-05-09 MED ORDER — METOPROLOL TARTRATE 1 MG/ML IV SOLN
7.5000 mg | Freq: Four times a day (QID) | INTRAVENOUS | Status: DC
  Administered 2014-05-10 (×4): 7.5 mg via INTRAVENOUS
  Filled 2014-05-09 (×4): qty 10

## 2014-05-09 NOTE — Progress Notes (Signed)
6 Days Post-Op  Subjective: He is looking better.  More from the ostomy, gas and black colored fluid in bag this AM.  Wound looks good.  Better urine output.  Objective: Vital signs in last 24 hours: Temp:  [97.6 F (36.4 C)-98.3 F (36.8 C)] 98 F (36.7 C) (12/30 0400) Pulse Rate:  [88-124] 98 (12/30 0400) Resp:  [19-32] 22 (12/30 0400) BP: (94-153)/(70-93) 145/73 mmHg (12/30 0400) SpO2:  [83 %-100 %] 100 % (12/30 0400) Last BM Date: 05/08/14 500 from ostomy, 1075 from urine yesterday recorded. Afebrile, VSS, BP mcu better Last CVP was 8  Running 7-8 this Am. Labs OK Intake/Output from previous day: 12/29 0701 - 12/30 0700 In: 3567.5 [I.V.:3117.5; IV Piggyback:450] Out: 4235 [Urine:1075; Stool:500] Intake/Output this shift:    General appearance: alert, cooperative and no distress Resp: clear to auscultation bilaterally GI: soft,sore, + BS, open wound and ostomy look good.  Lab Results:   Recent Labs  05/08/14 0200 05/09/14 0525  WBC 11.6* 8.6  HGB 10.2* 11.1*  HCT 31.5* 34.4*  PLT 228 170    BMET  Recent Labs  05/08/14 0200 05/09/14 0525  NA 132* 135  K 4.2 3.8  CL 110 114*  CO2 17* 17*  GLUCOSE 89 81  BUN 16 16  CREATININE 0.88 0.76  CALCIUM 7.4* 7.3*   PT/INR No results for input(s): LABPROT, INR in the last 72 hours.   Recent Labs Lab 05/02/14 1511 05/03/14 0033  AST 9 11  ALT 9 9  ALKPHOS 104 139*  BILITOT 0.79 1.0  PROT 5.6* 5.5*  ALBUMIN 2.0* 2.2*     Lipase     Component Value Date/Time   LIPASE <10* 05/03/2014 0130     Studies/Results: No results found.  Medications: . sodium chloride   Intravenous Once  . sodium chloride   Intravenous Once  . sodium chloride  10 mL/hr Intravenous Once  . antiseptic oral rinse  7 mL Mouth Rinse BID  . ceFEPime (MAXIPIME) IV  2 g Intravenous Q8H  . enoxaparin (LOVENOX) injection  40 mg Subcutaneous Q24H  . metoprolol  5 mg Intravenous 4 times per day  . metronidazole  500 mg  Intravenous Q8H  . pantoprazole (PROTONIX) IV  40 mg Intravenous Q24H  . sodium chloride  10 mL Intravenous Q12H    Assessment/Plan 1. 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 Has been on active chemotx supervised by Dr. Wanda Plump. [name]. 2. . SIGMOID AND LEFT COLON COLECTOMY, END COLOSTOMY, MOBILIZATION OF SPLENIC FLEXURE - 05/03/2014 - D. Newman For perforated sigmoid colon at site of colon cancer -  3. History of CAD 4. Leukopenia - Improving WBC - 9,600 - 05/07/2014 5. Malnutrition 6. HTN 7. Anemia - Hgb - 11.1 - post transfusion   Plan:  Full liquids, he can transfer to floor when OK with Medicine.  OK to remove Central line.  I will decrease his IV fluids, continue to mobilize.    LOS: 7 days    Frank [name] 05/09/2014

## 2014-05-09 NOTE — Progress Notes (Signed)
IP PROGRESS NOTE  Subjective:   Patient continue to recover well without any new complaints. Bowel function appears to be returning was increased amount of production in his colostomy bag. Pain is under reasonable control and continues to participate in physical therapy.  Objective:  Vital signs in last 24 hours: Temp:  [97.6 F (36.4 C)-98.3 F (36.8 C)] 98 F (36.7 C) (12/30 0400) Pulse Rate:  [88-124] 98 (12/30 0400) Resp:  [19-32] 22 (12/30 0400) BP: (94-153)/(70-93) 145/73 mmHg (12/30 0400) SpO2:  [83 %-100 %] 100 % (12/30 0400) Weight change:  Last BM Date: 05/08/14  Intake/Output from previous day: 12/29 0701 - 12/30 0700 In: 3567.5 [I.V.:3117.5; IV Piggyback:450] Out: 1575 [Urine:1075; Stool:500]  Well-appearing gentleman did not appear in any distress. Mouth: mucous membranes moist, pharynx normal without lesions Resp: clear to auscultation bilaterally Cardio: regular rate and rhythm, S1, S2 normal, no murmur, click, rub or gallop GI: Soft and slightly tender without any rebound or guarding. Bowel sounds auscultated. Extremities: extremities normal, atraumatic, no cyanosis or edema  Portacath without erythema  Lab Results:  Recent Labs  05/08/14 0200 05/09/14 0525  WBC 11.6* 8.6  HGB 10.2* 11.1*  HCT 31.5* 34.4*  PLT 228 170    BMET  Recent Labs  05/08/14 0200 05/09/14 0525  NA 132* 135  K 4.2 3.8  CL 110 114*  CO2 17* 17*  GLUCOSE 89 81  BUN 16 16  CREATININE 0.88 0.76  CALCIUM 7.4* 7.3*      Medications: I have reviewed the patient's current medications.  Assessment/Plan:  68 year old gentleman with the following issues:  1. 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 He received 2 cycles of chemotherapy with some decrease in his hepatic metastasis as evident by the CT scan noted on 05/02/2014. Chemotherapy will be on hold for now. He expressed wishes not to receive further chemotherapy in the future. I  discussed with him this option and certainly it is reasonable to do so if these are his wishes. He understands chemotherapy will never be curative but certainly can offer palliation of his disease for a period of time. I also discussed with him the option of reduced doses of chemotherapy to palliate his cancer with limited toxicity. For the time being, he would consider it and he is also contemplating home hospice upon discharge. I certainly support his decision regardless and if he changes his mind about future chemotherapy we will certainly discuss that with him as an outpatient.   2. Colonic perforation and peritonitis. He is status post sigmoid left colon colectomy and end colostomy done on 05/03/2014 for a perforated sigmoid colon. He tolerated the procedure well and so far without any major complications. He is continuing to recover.  3. Neutropenia: This have resolved after systemic chemotherapy.  We will arrange a follow-up at the [facility] upon his discharge for further discussion regarding his cancer care.    LOS: 7 days   HUDJSH,FWYOV 05/09/2014, 7:44 AM

## 2014-05-09 NOTE — Progress Notes (Signed)
ANTIBIOTIC CONSULT NOTE - Follow up  Pharmacy Consult for Cefepime Indication: Perforated bowel, peritonitis  Allergies  Allergen Reactions  . Simvastatin Other (See Comments)    "Muscle aches"    Patient Measurements: Height: 5\' 9"  (175.3 cm) Weight: 205 lb 7.5 oz (93.2 kg) IBW/kg (Calculated) : 70.7  Vital Signs: Temp: 97.7 F (36.5 C) (12/30 1200) Temp Source: Oral (12/30 1200) BP: 171/55 mmHg (12/30 1200) Pulse Rate: 67 (12/30 1200) Intake/Output from previous day: 12/29 0701 - 12/30 0700 In: 3567.5 [I.V.:3117.5; IV Piggyback:450] Out: 1610 [Urine:1075; Stool:500] Intake/Output from this shift: Total I/O In: 500 [P.O.:320; I.V.:30; IV Piggyback:150] Out: 450 [Urine:250; Stool:200]  Labs:  Recent Labs  05/07/14 0438 05/08/14 0200 05/09/14 0525  WBC 9.6 11.6* 8.6  HGB 11.1* 10.2* 11.1*  PLT 245 228 170  CREATININE 0.66 0.88 0.76   Estimated Creatinine Clearance: 96.9 mL/min (by C-G formula based on Cr of 0.76). No results for input(s): VANCOTROUGH, VANCOPEAK, VANCORANDOM, GENTTROUGH, GENTPEAK, GENTRANDOM, TOBRATROUGH, TOBRAPEAK, TOBRARND, AMIKACINPEAK, AMIKACINTROU, AMIKACIN in the last 72 hours.   Microbiology: Recent Results (from the past 720 hour(s))  TECHNOLOGIST REVIEW     Status: None   Collection Time: 04/10/14  9:29 AM  Result Value Ref Range Status   Technologist Review Metas and Myelocytes present  Final  TECHNOLOGIST REVIEW     Status: None   Collection Time: 05/02/14  3:10 PM  Result Value Ref Range Status   Technologist Review Rare meta  Final  Urine culture     Status: None   Collection Time: 05/03/14 12:39 AM  Result Value Ref Range Status   Specimen Description URINE, RANDOM  Final   Special Requests NONE  Final   Colony Count NO GROWTH Performed at [facility]   Final   Culture NO GROWTH Performed at [facility]   Final   Report Status 05/04/2014 FINAL  Final  Anaerobic culture     Status: None   Collection  Time: 05/03/14  3:26 PM  Result Value Ref Range Status   Specimen Description PERITONEAL  Final   Special Requests NONE  Final   Gram Stain   Final    NO WBC SEEN RARE GRAM NEGATIVE RODS Performed at [facility]    Culture   Final    NO ANAEROBES ISOLATED Performed at [facility]    Report Status 05/08/2014 FINAL  Final  MRSA PCR Screening     Status: None   Collection Time: 05/03/14  8:27 PM  Result Value Ref Range Status   MRSA by PCR NEGATIVE NEGATIVE Final    Comment:        The GeneXpert MRSA Assay (FDA approved for NASAL specimens only), is one component of a comprehensive MRSA colonization surveillance program. It is not intended to diagnose MRSA infection nor to guide or monitor treatment for MRSA infections.     Assessment: 68yo M w/ nausea, abdominal pain, generalized weakness, and neutropenia. Metastatic colon cancer, last received chemotherapy on 12/15-12/17. Denies feeling feverish and is afebrile in the ED. CT revealed sigmoid colon perforation and peritonitis. Flagyl started and pharmacy is asked to dose Cefepime. 12/24 underwent hemicolectomy with colostomy.  12/24 >> Cipro - cancelled 12/24 >> Flagyl >> 12/24 >> Cefepime >>  Tmax: AF since admit WBCs: improved to wnl Renal: SCr back to baseline, stable, 97CG,   12/24 peritoneal Cx (anaerobic): rare GNR but nothing grew 12/24 urine: NGF 12/24 MRSA nasal: negative  Drug level / dose changes info:  12/30: D7 Cefepime 2g q8h, Flagyl 500mg  IV q8h for bowel perforation/peritonitis w/ neutropenia. VSS. Planning tx to floor soon. Descalate abx? Note due 12/30.  Goal of Therapy:  Appropriate antibiotic dosing for renal function; eradication of infection   Plan:  Continue Cefepime 2g IV q8h. Follow clinical course, SCr. Today is Day 7 of Cefepime and Flagyl. Patient has improved clinically, remains afebrile, and all cultures are negative. Please consider stopping antibiotics.  Romeo Rabon, PharmD, pager [phone]. 05/09/2014,12:58 PM.

## 2014-05-09 NOTE — Progress Notes (Addendum)
VASCULAR LAB PRELIMINARY  PRELIMINARY  PRELIMINARY  PRELIMINARY  Left upper extremity venous duplex completed.    Preliminary report:  Left:  No evidence of DVT or acut superficial thrombosis.There is a small chronic superficial thrombus in the cephalic vein at the elbow. Unable to evaluate the internal jugular due to bandage for a neck line. Moderate interstitial fluid noted in the left forearm.  Mairead Schwarzkopf, [place], RVS 05/09/2014, 1:03 PM

## 2014-05-09 NOTE — Progress Notes (Signed)
PROGRESS NOTE  Frank [name] GYJ:[id number] DOB: 1945/09/25 DOA: 05/02/2014 PCP: [name], MD  Assessment/Plan: abd pain, nausea, vomiting: due to bowel perforation and peritonitis -continue PRN antiemetics and analgesics -continue IV antibiotics for now; no fever-->change to po when able to tolerate full liquids--plan 10 days abx for peritonitis -s/p hemicolectomy with end colostomy 05/03/14 -making stool and gas in ostomy -appreciate general surgery  -started sips on 05/06/14; NGT removed on 12/25 -05/07/14--started clears-->tolerating -CVP = 0-4-->increase IVF -switch to oral abx when able to tolerate full liquids -move out of ICU/Stepdown  L-Upper extremity edema -venous duplex r/o DVT--neg for DVT--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  tachycardia: due to dehydration, blood loss and pain -continue IV metoprolol until able to reliably take po -improved overall -increase IVF  pancytopenia:  -due to ongoing chemotherapy-->improving with time -restart Lovenox -counts recovering -appreciate Dr. [name] -loss almost 1000ML of blood during surgery, received 2 units PRBC's -current Hgb 8.0 (goal given hx of CAD and CABG is hgb > 8)  Uncontrolled HTN:  -Increase Lopressor to 7.5 mg IV every 6 hours -decrease hydralazine to bid -will follow VS  chronic diastolic heart failure -compensated at this point -will continue holding lasix--CVP 0-4 -daily weights and strict intake/output -continue low dose b-blocker ordered  hx of CAD:  s/p CABG in the past -no CP or SOB. -continue B-blocker -will monitor.  metastatic colon cancer:  -per oncology service. -mets to liver  Goals of Care -appreciate Dr. [name] -plan to go home with hospice care once medically optimzed -no further plans for chemo  Family Communication:   Daughter updated at beside Disposition Plan:   Home when medically stable       Procedures/Studies: Ct Head Wo  Contrast  05/02/2014   CLINICAL DATA:  Increased confusion today with nausea and vomiting  EXAM: CT HEAD WITHOUT CONTRAST  TECHNIQUE: Contiguous axial images were obtained from the base of the skull through the vertex without intravenous contrast.  COMPARISON:  None  FINDINGS: Advanced atrophy with prominence of the bifrontal extra-axial spaces. Gray-white differentiation is maintained. No CT evidence of acute large territory infarct. No intraparenchymal extra-axial mass or hemorrhage. Unchanged size and configuration of the ventricles and basilar cisterns. No midline shift.  Limited visualization of the paranasal sinuses and mastoid air cells are normal. No air-fluid level. There is potential enlargement of the sella (image 9, series 3).  Regional soft tissues appear normal. Paranasal sinuses and mastoid air cells are normal. No displaced calvarial fracture.  IMPRESSION: 1. Advanced atrophy without acute intracranial process. 2. Nonspecific potential enlargement of the sella. Further evaluation could be performed with nonemergent brain MRI as clinically indicated.   Electronically Signed   By: [name] M.D.   On: 05/02/2014 21:51   Ct Abdomen Pelvis W Contrast  05/02/2014   CLINICAL DATA:  Mid abdominal pain, nausea and vomiting, after chemotherapy. History of colon cancer.  EXAM: CT ABDOMEN AND PELVIS WITH CONTRAST  TECHNIQUE: Multidetector CT imaging of the abdomen and pelvis was performed using the standard protocol following bolus administration of intravenous contrast.  CONTRAST:  193mL OMNIPAQUE IOHEXOL 300 MG/ML  SOLN  COMPARISON:  PET-CT 03/07/2014, CT abdomen/ pelvis 02/22/2014  FINDINGS: Evidence of CABG partly visualized. Trace pleural effusions are noted with associated bibasilar linear presumed atelectasis.  A few foci of free intraperitoneal air are identified image 19. Epicardial nodes are identified, 0.6 cm diameter image 12.  Innumerable hepatic masses are  reidentified with overall nodular  liver contour suggesting underlying cirrhosis. Representative posterior segment right hepatic lobe mass is slightly smaller, now 5.4 x 4.7 cm image 22, previously 6.8 x 5.8 cm.  Porta hepatis node is stable measuring 0.9 cm image 30. Stable subcentimeter periaortic lymph nodes.  There are numerous loops of small bowel with diameter at upper limits of normal, representative 3.1 cm diameter loop of bowel within the left upper quadrant image 32, but which demonstrate abnormal wall thickening. Within the colon, the previously seen area of masslike sigmoid wall thickening is reidentified, image 71. There is a new rim enhancing gas/ fluid collection compatible with abscess from colonic perforation image 70, measuring 3.5 x 3.3 cm. Foci of gas are noted tracking throughout the mesentery as well. Large volume ascites is noted with interval development of rim enhancement suggesting superimposed infection, for example image 72. Pericolonic nodes are reidentified, representative 0.6 cm node image 64. There is increased pericolonic stranding. Increased stranding and fluid infiltration of the mesentery/omentum is noted without measurable nodularity.  Aortic atheromatous calcification without aneurysm. Fat containing left inguinal hernia noted. The bladder is unremarkable.  Evidence of right L5 laminectomy. Multilevel lumbar spine disc degenerative change noted. No focal osseous lesion is identified. The bones are subjectively mildly sclerotic, unchanged, raising the question of possible metastatic infiltration although this could be a normal variant.  IMPRESSION: Interval sigmoid colonic perforation adjacent to the previously seen masslike wall thickening compatible with primary colonic carcinoma and intra-abdominal abscess formation.  Small bowel wall thickening may suggest secondary enteritis. No evidence for small bowel obstruction at this time.  Diffusely rim enhancing large volume ascites and mesenteric infiltration  suggesting peritonitis.  Decrease in size of hepatic representative metastatic lesion.  Critical Value/emergent results were called by telephone at the time of interpretation on 05/02/2014 at 11:38 pm to [facility] for Dr. Berle Mull , who verbally acknowledged these results.   Electronically Signed   By: Conchita Paris M.D.   On: 05/02/2014 23:40   Dg Chest Port 1 View  05/03/2014   CLINICAL DATA:  Central line placement.  EXAM: PORTABLE CHEST - 1 VIEW  COMPARISON:  02/22/2014  FINDINGS: Sternotomy wires are present. Nasogastric tube has tip and side-port over the stomach in the left upper quadrant. Left IJ central venous catheter has tip in the region of the cavoatrial junction. Right IJ central venous catheter has tip over the region of the SVC.  Lungs are hypoinflated with mild linear density in the left base likely atelectasis. There is subtle density in the medial right base likely atelectasis. No definite effusion or pneumothorax. Cardiomediastinal silhouette and remainder of the exam is unchanged.  IMPRESSION: Hypoinflation with mild bibasilar opacification likely atelectasis.  Tubes and lines as described.  No definite pneumothorax.   Electronically Signed   By: Marin Olp M.D.   On: 05/03/2014 18:53         Subjective: Patient continues to complain of abdominal pain which is controlled with the present medications. Denies any vomiting, headache, chest pain, shortness of breath, coughing, hemoptysis, dysuria, hematuria  Objective: Filed Vitals:   05/09/14 1300 05/09/14 1600 05/09/14 1700 05/09/14 1800  BP: 172/58 156/82 170/74 172/60  Pulse: 52 116 111 47  Temp:  98 F (36.7 C)    TempSrc:  Oral    Resp: 31 32 22 23  Height:      Weight:      SpO2: 100% 100% 98% 100%    Intake/Output Summary (Last  24 hours) at 05/09/14 1834 Last data filed at 05/09/14 1800  Gross per 24 hour  Intake   3400 ml  Output   1525 ml  Net   1875 ml   Weight change:  Exam:   General:  Pt  is alert, follows commands appropriately, not in acute distress  HEENT: No icterus, No thrush, No neck mass, [place]/AT  Cardiovascular: RRR, S1/S2, no rubs, no gallops  Respiratory: CTA bilaterally, no wheezing, no crackles, no rhonchi  Abdomen: Soft/+BS, diffusely tender. No peritoneal sign; non distended, no guarding  Extremities: 1+LE edema, No lymphangitis, No petechiae, No rashes, no synovitis  Data Reviewed: Basic Metabolic Panel:  Recent Labs Lab 05/05/14 0425 05/06/14 0840 05/07/14 0438 05/08/14 0200 05/09/14 0525  NA 131* 130* 131* 132* 135  K 4.1 4.5 4.0 4.2 3.8  CL 111 111 110 110 114*  CO2 17* 17* 18* 17* 17*  GLUCOSE 229* 239* 96 89 81  BUN 27* 19 15 16 16   CREATININE 0.87 0.71 0.66 0.88 0.76  CALCIUM 7.2* 7.2* 7.5* 7.4* 7.3*  MG 1.7  --   --   --   --    Liver Function Tests:  Recent Labs Lab 05/03/14 0033  AST 11  ALT 9  ALKPHOS 139*  BILITOT 1.0  PROT 5.5*  ALBUMIN 2.2*    Recent Labs Lab 05/03/14 0130  LIPASE <10*    Recent Labs Lab 05/03/14 0130  AMMONIA 17   CBC:  Recent Labs Lab 05/03/14 0033 05/03/14 2105 05/04/14 0510 05/05/14 0425 05/06/14 0840 05/07/14 0438 05/08/14 0200 05/09/14 0525  WBC 1.1* 2.3* 1.9* 2.3* 4.7 9.6 11.6* 8.6  NEUTROABS 0.8* 1.6* 1.2*  --   --   --   --   --   HGB 9.5* 10.3* 9.3* 8.0* 9.1* 11.1* 10.2* 11.1*  HCT 30.0* 32.1* 28.4* 24.1* 27.4* 33.4* 31.5* 34.4*  MCV 85.7 86.5 85.5 85.5 83.8 82.9 84.5 85.4  PLT 84* 133* 128* 124* 158 245 228 170   Cardiac Enzymes: No results for input(s): CKTOTAL, CKMB, CKMBINDEX, TROPONINI in the last 168 hours. BNP: Invalid input(s): POCBNP CBG:  Recent Labs Lab 05/02/14 2120 05/03/14 0723 05/03/14 1955  GLUCAP 106* 103* 147*    Recent Results (from the past 240 hour(s))  TECHNOLOGIST REVIEW     Status: None   Collection Time: 05/02/14  3:10 PM  Result Value Ref Range Status   Technologist Review Rare meta  Final  Urine culture     Status: None    Collection Time: 05/03/14 12:39 AM  Result Value Ref Range Status   Specimen Description URINE, RANDOM  Final   Special Requests NONE  Final   Colony Count NO GROWTH Performed at [facility]   Final   Culture NO GROWTH Performed at [facility]   Final   Report Status 05/04/2014 FINAL  Final  Anaerobic culture     Status: None   Collection Time: 05/03/14  3:26 PM  Result Value Ref Range Status   Specimen Description PERITONEAL  Final   Special Requests NONE  Final   Gram Stain   Final    NO WBC SEEN RARE GRAM NEGATIVE RODS Performed at [facility]    Culture   Final    NO ANAEROBES ISOLATED Performed at [facility]    Report Status 05/08/2014 FINAL  Final  MRSA PCR Screening     Status: None   Collection Time: 05/03/14  8:27 PM  Result Value  Ref Range Status   MRSA by PCR NEGATIVE NEGATIVE Final    Comment:        The GeneXpert MRSA Assay (FDA approved for NASAL specimens only), is one component of a comprehensive MRSA colonization surveillance program. It is not intended to diagnose MRSA infection nor to guide or monitor treatment for MRSA infections.      Scheduled Meds: . sodium chloride   Intravenous Once  . sodium chloride   Intravenous Once  . sodium chloride  10 mL/hr Intravenous Once  . antiseptic oral rinse  7 mL Mouth Rinse BID  . ceFEPime (MAXIPIME) IV  2 g Intravenous Q8H  . enoxaparin (LOVENOX) injection  40 mg Subcutaneous Q24H  . metoprolol  5 mg Intravenous 4 times per day  . metronidazole  500 mg Intravenous Q8H  . pantoprazole (PROTONIX) IV  40 mg Intravenous Q24H  . sodium chloride  10 mL Intravenous Q12H   Continuous Infusions: . sodium chloride 10 mL/hr at 05/09/14 0600  . 0.9 % NaCl with KCl 20 mEq / L 125 mL/hr at 05/09/14 1800     Bostyn Kunkler, DO  [facility] Pager [phone]  If 7PM-7AM, please contact night-coverage [email or web address] Password [email or web address] 05/09/2014, 6:34 PM   LOS: 7 days

## 2014-05-09 NOTE — Progress Notes (Signed)
MD Tat made aware patient has slightly lower urine output this shift. Ordered to increase fluids to 125 mL/hr.

## 2014-05-10 ENCOUNTER — Inpatient Hospital Stay (HOSPITAL_COMMUNITY): Payer: Medicare Other

## 2014-05-10 LAB — CBC
HCT: 29.6 % — ABNORMAL LOW (ref 39.0–52.0)
Hemoglobin: 9.6 g/dL — ABNORMAL LOW (ref 13.0–17.0)
MCH: 27.7 pg (ref 26.0–34.0)
MCHC: 32.4 g/dL (ref 30.0–36.0)
MCV: 85.3 fL (ref 78.0–100.0)
PLATELETS: 185 10*3/uL (ref 150–400)
RBC: 3.47 MIL/uL — AB (ref 4.22–5.81)
RDW: 17.6 % — AB (ref 11.5–15.5)
WBC: 11.4 10*3/uL — ABNORMAL HIGH (ref 4.0–10.5)

## 2014-05-10 LAB — BASIC METABOLIC PANEL
Anion gap: 6 (ref 5–15)
BUN: 14 mg/dL (ref 6–23)
CALCIUM: 7.2 mg/dL — AB (ref 8.4–10.5)
CO2: 16 mmol/L — ABNORMAL LOW (ref 19–32)
Chloride: 115 mEq/L — ABNORMAL HIGH (ref 96–112)
Creatinine, Ser: 0.79 mg/dL (ref 0.50–1.35)
GFR calc Af Amer: >90 mL/min (ref >90)
GFR calc non Af Amer: 89 mL/min — ABNORMAL LOW (ref >90)
Glucose, Bld: 107 mg/dL — ABNORMAL HIGH (ref 70–99)
Potassium: 3.7 mmol/L (ref 3.5–5.1)
SODIUM: 137 mmol/L (ref 135–145)

## 2014-05-10 MED ORDER — METRONIDAZOLE 500 MG PO TABS
500.0000 mg | ORAL_TABLET | Freq: Three times a day (TID) | ORAL | Status: DC
  Administered 2014-05-10 – 2014-05-14 (×12): 500 mg via ORAL
  Filled 2014-05-10 (×15): qty 1

## 2014-05-10 MED ORDER — LEVOFLOXACIN 750 MG PO TABS
750.0000 mg | ORAL_TABLET | ORAL | Status: DC
  Administered 2014-05-10 – 2014-05-13 (×4): 750 mg via ORAL
  Filled 2014-05-10 (×5): qty 1

## 2014-05-10 MED ORDER — METOPROLOL TARTRATE 50 MG PO TABS
75.0000 mg | ORAL_TABLET | Freq: Two times a day (BID) | ORAL | Status: DC
  Administered 2014-05-10 – 2014-05-21 (×15): 75 mg via ORAL
  Filled 2014-05-10 (×41): qty 1

## 2014-05-10 MED ORDER — POTASSIUM CHLORIDE IN NACL 20-0.9 MEQ/L-% IV SOLN
INTRAVENOUS | Status: DC
  Administered 2014-05-10 – 2014-05-11 (×2): via INTRAVENOUS
  Filled 2014-05-10 (×2): qty 1000

## 2014-05-10 MED ORDER — DEXTROSE 5 % IV SOLN
2.0000 g | Freq: Two times a day (BID) | INTRAVENOUS | Status: DC
  Filled 2014-05-10: qty 2

## 2014-05-10 MED ORDER — PANTOPRAZOLE SODIUM 40 MG PO TBEC
40.0000 mg | DELAYED_RELEASE_TABLET | Freq: Every day | ORAL | Status: DC
  Administered 2014-05-11 – 2014-05-21 (×11): 40 mg via ORAL
  Filled 2014-05-10 (×11): qty 1

## 2014-05-10 NOTE — Progress Notes (Signed)
Inter-Dry to groin area after cleansing. Day 1

## 2014-05-10 NOTE — Progress Notes (Signed)
Frank [name] [id number] QA [id number] IN W/CHAIR WITH AMY NT. NO CHANGE IN HIS CONDITION. REPORT WAS GIVEN TO [place] RN.

## 2014-05-10 NOTE — Progress Notes (Signed)
Pt arrived from ICU in w/c, amb to bed w/ standby assist. Abd dsg C/D/I with left colostomy pouch intact draining black/green liquid. Pt denies pain/nausea/discomfort at present. Assisted up to chair, callbell in reach. Family bedside. Pt and family oriented to callbell and environment.

## 2014-05-10 NOTE — Progress Notes (Signed)
ANTIBIOTIC CONSULT NOTE - Follow up  Pharmacy Consult for Cefepime Indication: Perforated bowel, peritonitis  Allergies  Allergen Reactions  . Simvastatin Other (See Comments)    "Muscle aches"    Patient Measurements: Height: 5\' 9"  (175.3 cm) Weight: 213 lb 6.5 oz (96.8 kg) IBW/kg (Calculated) : 70.7  Vital Signs: Temp: 97.7 F (36.5 C) (12/31 0800) Temp Source: Oral (12/31 0800) BP: 141/86 mmHg (12/31 0900) Pulse Rate: 51 (12/31 0500) Intake/Output from previous day: 12/30 0701 - 12/31 0700 In: 8469 [P.O.:500; I.V.:2465; IV Piggyback:450] Out: 1025 [Urine:625; Stool:400] Intake/Output from this shift: Total I/O In: 310 [I.V.:260; IV Piggyback:50] Out: 225 [Urine:225]  Labs:  Recent Labs  05/08/14 0200 05/09/14 0525 05/10/14 0447  WBC 11.6* 8.6 11.4*  HGB 10.2* 11.1* 9.6*  PLT 228 170 185  CREATININE 0.88 0.76 0.79   Estimated Creatinine Clearance: 98.6 mL/min (by C-G formula based on Cr of 0.79). No results for input(s): VANCOTROUGH, VANCOPEAK, VANCORANDOM, GENTTROUGH, GENTPEAK, GENTRANDOM, TOBRATROUGH, TOBRAPEAK, TOBRARND, AMIKACINPEAK, AMIKACINTROU, AMIKACIN in the last 72 hours.   Microbiology: Recent Results (from the past 720 hour(s))  TECHNOLOGIST REVIEW     Status: None   Collection Time: 05/02/14  3:10 PM  Result Value Ref Range Status   Technologist Review Rare meta  Final  Urine culture     Status: None   Collection Time: 05/03/14 12:39 AM  Result Value Ref Range Status   Specimen Description URINE, RANDOM  Final   Special Requests NONE  Final   Colony Count NO GROWTH Performed at [facility]   Final   Culture NO GROWTH Performed at [facility]   Final   Report Status 05/04/2014 FINAL  Final  Anaerobic culture     Status: None   Collection Time: 05/03/14  3:26 PM  Result Value Ref Range Status   Specimen Description PERITONEAL  Final   Special Requests NONE  Final   Gram Stain   Final    NO WBC SEEN RARE GRAM  NEGATIVE RODS Performed at [facility]    Culture   Final    NO ANAEROBES ISOLATED Performed at [facility]    Report Status 05/08/2014 FINAL  Final  MRSA PCR Screening     Status: None   Collection Time: 05/03/14  8:27 PM  Result Value Ref Range Status   MRSA by PCR NEGATIVE NEGATIVE Final    Comment:        The GeneXpert MRSA Assay (FDA approved for NASAL specimens only), is one component of a comprehensive MRSA colonization surveillance program. It is not intended to diagnose MRSA infection nor to guide or monitor treatment for MRSA infections.     Assessment: 68yo M w/ nausea, abdominal pain, generalized weakness, and neutropenia. Metastatic colon cancer, last received chemotherapy on 12/15-12/17. Denies feeling feverish and is afebrile in the ED. CT revealed sigmoid colon perforation and peritonitis. Flagyl started and pharmacy is asked to dose Cefepime. 12/24 underwent hemicolectomy with colostomy.  12/24 >> Cipro - cancelled 12/24 >> Flagyl >> 12/24 >> Cefepime >>  Tmax: AF since admit WBCs: slightly elev Renal: SCr back to baseline, stable, 97CG,   12/24 peritoneal Cx (anaerobic): rare GNR but nothing grew 12/24 urine: negative 12/24 MRSA nasal: negative  Goal of Therapy:  Appropriate antibiotic dosing for renal function; eradication of infection  Plan:  Decrease Cefepime from 2g q8h to 2g q12h since neutropenia resolved, improved clinically, and also on Flagyl. Follow clinical course, SCr.  Romeo Rabon, PharmD,  pager [phone]. 05/10/2014,9:47 AM.

## 2014-05-10 NOTE — Progress Notes (Signed)
7 Days Post-Op  Subjective: Had some nausea, SOB yesterday.  Worse when he got up.  He has an irregular rhythm now, I think it's sinus but would need an EKG to be sure.  He says nothing taste very good.  Objective: Vital signs in last 24 hours: Temp:  [97.7 F (36.5 C)-98.7 F (37.1 C)] 98.7 F (37.1 C) (12/31 0400) Pulse Rate:  [47-116] 54 (12/31 0600) Resp:  [19-32] 26 (12/31 0600) BP: (104-186)/(52-82) 124/79 mmHg (12/31 0600) SpO2:  [96 %-100 %] 99 % (12/31 0600) Weight:  [96.2 kg (212 lb 1.3 oz)-96.8 kg (213 lb 6.5 oz)] 96.8 kg (213 lb 6.5 oz) (12/31 0455) Last BM Date: 05/09/14 500 PO recorded 400 from colostomy recorded Afebrile, VSS Labs stable WBC up some. Intake/Output from previous day: 12/30 0701 - 12/31 0700 In: 3280 [P.O.:500; I.V.:2330; IV Piggyback:450] Out: 1025 [Urine:625; Stool:400] Intake/Output this shift:    General appearance: alert, cooperative and no distress GI: soft sore, fewl BS, allot of gastric appearing fluid in ostomy bag.  Ostomy looks fine.  Ope wound is clean and OK  Lab Results:   Recent Labs  05/09/14 0525 05/10/14 0447  WBC 8.6 11.4*  HGB 11.1* 9.6*  HCT 34.4* 29.6*  PLT 170 185    BMET  Recent Labs  05/09/14 0525 05/10/14 0447  NA 135 137  K 3.8 3.7  CL 114* 115*  CO2 17* 16*  GLUCOSE 81 107*  BUN 16 14  CREATININE 0.76 0.79  CALCIUM 7.3* 7.2*   PT/INR No results for input(s): LABPROT, INR in the last 72 hours.  No results for input(s): AST, ALT, ALKPHOS, BILITOT, PROT, ALBUMIN in the last 168 hours.   Lipase     Component Value Date/Time   LIPASE <10* 05/03/2014 0130     Studies/Results: No results found.  Medications: . sodium chloride   Intravenous Once  . sodium chloride   Intravenous Once  . sodium chloride  10 mL/hr Intravenous Once  . antiseptic oral rinse  7 mL Mouth Rinse BID  . ceFEPime (MAXIPIME) IV  2 g Intravenous Q8H  . enoxaparin (LOVENOX) injection  40 mg Subcutaneous Q24H  . metoprolol   7.5 mg Intravenous 4 times per day  . metronidazole  500 mg Intravenous Q8H  . pantoprazole (PROTONIX) IV  40 mg Intravenous Q24H  . sodium chloride  10 mL Intravenous Q12H    Assessment/Plan 1. 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 Has been on active chemotx supervised by Dr. Wanda Plump. Frank [name]. 2. . SIGMOID AND LEFT COLON COLECTOMY, END COLOSTOMY, MOBILIZATION OF SPLENIC FLEXURE - 05/03/2014 - D. Newman For perforated sigmoid colon at site of colon cancer -  3. History of CAD 4. Leukopenia - Improving WBC - 9,600 - 05/07/2014 5. Malnutrition 6. HTN 7. Anemia - Hgb - 11.1 - post transfusion   Plan:  I am going to go to a soft diet.  I told him to go slow but I think he can handle it.  Defer transfer to Medicine    LOS: 8 days    Frank [name] 05/10/2014

## 2014-05-10 NOTE — Progress Notes (Signed)
PROGRESS NOTE  Frank [name] MVE:[id number] DOB: 1945/10/02 DOA: 05/02/2014 PCP: [name], MD  Brief history 68 y.o. male with Past medical history of metastatic colon cancer to liver, hypertension, coronary artery disease status post CABG, diastolic dysfunction presented with abdominal pain, nausea, and vomiting with generalized weakness. CT of the abdomen and pelvis on the day of admission revealed sigmoid colonic perforation with diffuse Lahey rim enhancing large volume ascites. General surgery was consulted, and the patient was taken to the operating room where he had a hemicolectomy and end colostomy on 05/03/2014. The patient's diet was gradually advanced as his bowel function returned. Palliative medicine was consulted and the patient was changed to DO NOT RESUSCITATE. Although there has been discussion regarding stopping any further chemotherapy and going home with hospice, the patient has not yet made a definitive decision. Dr. [name] continues to follow. Assessment/Plan: abd pain, nausea, vomiting: due to bowel perforation and peritonitis -continue PRN antiemetics and analgesics -continue IV antibiotics for now; no fever-->change to po when able to tolerate full liquids--plan 10 days abx for peritonitis -s/p hemicolectomy with end colostomy 05/03/14 -making stool and gas in ostomy -appreciate general surgery  -started sips on 05/06/14; NGT removed on 12/25 -05/07/14--started clears-->tolerating -CVP = 0-4-->increase IVF -switch to oral abx when able to tolerate full liquids -05/10/14--moved out of ICU/Stepdown -05/10/14--soft diet -Start po Levaquin and metronidazole--continue antibiotics through 05/12/2014  Scrotal Edema -likely hydroceles -scrotal US -elevate scrotum  L-Upper extremity edema -venous duplex r/o DVT--neg for DVT--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  tachycardia:  -due to dehydration, blood loss and pain -sinus with PACs on  tele -continued IV metoprolol until able to reliably take po -05/10/2014--switched to po metoprolol -improved overall -increased IVF--> improving -As the patient is increasing oral intake, decreased IV fluids  pancytopenia:  -due to ongoing chemotherapy-->improving with time -restart Lovenox -counts recovering -appreciate Dr. [name] -loss almost 1000ML of blood during surgery, received 2 units PRBC's -current Hgb 8.0 (goal given hx of CAD and CABG is hgb > 8)  Uncontrolled HTN:  -Increase Lopressor to 7.5 mg IV every 6 hours -Changed metoprolol to po -will follow VS -improving -partly due to pain  chronic diastolic heart failure -compensated at this point -will continue holding lasix--CVP 0-4 in stepdown -daily weights and strict intake/output -continue po b-blocker ordered  hx of CAD:  s/p CABG in the past -no CP or SOB. -continue B-blocker -will monitor.  metastatic colon cancer:  -per oncology service. -mets to liver  Goals of Care -appreciate Dr. [name] -plan to go home with home health -pt has not made final decision on continuing vs stopping chem altogether  Family Communication: Son updated at beside--05/10/14 Disposition Plan: Home when medically stable 1-2 days         Procedures/Studies: Ct Head Wo Contrast  05/02/2014   CLINICAL DATA:  Increased confusion today with nausea and vomiting  EXAM: CT HEAD WITHOUT CONTRAST  TECHNIQUE: Contiguous axial images were obtained from the base of the skull through the vertex without intravenous contrast.  COMPARISON:  None  FINDINGS: Advanced atrophy with prominence of the bifrontal extra-axial spaces. Gray-white differentiation is maintained. No CT evidence of acute large territory infarct. No intraparenchymal extra-axial mass or hemorrhage. Unchanged size and configuration of the ventricles and basilar cisterns. No midline shift.  Limited visualization of the paranasal sinuses and mastoid air cells are  normal. No air-fluid level. There is potential enlargement of the sella (image  9, series 3).  Regional soft tissues appear normal. Paranasal sinuses and mastoid air cells are normal. No displaced calvarial fracture.  IMPRESSION: 1. Advanced atrophy without acute intracranial process. 2. Nonspecific potential enlargement of the sella. Further evaluation could be performed with nonemergent brain MRI as clinically indicated.   Electronically Signed   By: [name] M.D.   On: 05/02/2014 21:51   Ct Abdomen Pelvis W Contrast  05/02/2014   CLINICAL DATA:  Mid abdominal pain, nausea and vomiting, after chemotherapy. History of colon cancer.  EXAM: CT ABDOMEN AND PELVIS WITH CONTRAST  TECHNIQUE: Multidetector CT imaging of the abdomen and pelvis was performed using the standard protocol following bolus administration of intravenous contrast.  CONTRAST:  116mL OMNIPAQUE IOHEXOL 300 MG/ML  SOLN  COMPARISON:  PET-CT 03/07/2014, CT abdomen/ pelvis 02/22/2014  FINDINGS: Evidence of CABG partly visualized. Trace pleural effusions are noted with associated bibasilar linear presumed atelectasis.  A few foci of free intraperitoneal air are identified image 19. Epicardial nodes are identified, 0.6 cm diameter image 12.  Innumerable hepatic masses are reidentified with overall nodular liver contour suggesting underlying cirrhosis. Representative posterior segment right hepatic lobe mass is slightly smaller, now 5.4 x 4.7 cm image 22, previously 6.8 x 5.8 cm.  Porta hepatis node is stable measuring 0.9 cm image 30. Stable subcentimeter periaortic lymph nodes.  There are numerous loops of small bowel with diameter at upper limits of normal, representative 3.1 cm diameter loop of bowel within the left upper quadrant image 32, but which demonstrate abnormal wall thickening. Within the colon, the previously seen area of masslike sigmoid wall thickening is reidentified, image 71. There is a new rim enhancing gas/ fluid collection  compatible with abscess from colonic perforation image 70, measuring 3.5 x 3.3 cm. Foci of gas are noted tracking throughout the mesentery as well. Large volume ascites is noted with interval development of rim enhancement suggesting superimposed infection, for example image 72. Pericolonic nodes are reidentified, representative 0.6 cm node image 64. There is increased pericolonic stranding. Increased stranding and fluid infiltration of the mesentery/omentum is noted without measurable nodularity.  Aortic atheromatous calcification without aneurysm. Fat containing left inguinal hernia noted. The bladder is unremarkable.  Evidence of right L5 laminectomy. Multilevel lumbar spine disc degenerative change noted. No focal osseous lesion is identified. The bones are subjectively mildly sclerotic, unchanged, raising the question of possible metastatic infiltration although this could be a normal variant.  IMPRESSION: Interval sigmoid colonic perforation adjacent to the previously seen masslike wall thickening compatible with primary colonic carcinoma and intra-abdominal abscess formation.  Small bowel wall thickening may suggest secondary enteritis. No evidence for small bowel obstruction at this time.  Diffusely rim enhancing large volume ascites and mesenteric infiltration suggesting peritonitis.  Decrease in size of hepatic representative metastatic lesion.  Critical Value/emergent results were called by telephone at the time of interpretation on 05/02/2014 at 11:38 pm to [facility] for Dr. Berle Mull , who verbally acknowledged these results.   Electronically Signed   By: Conchita Paris M.D.   On: 05/02/2014 23:40   Dg Chest Port 1 View  05/03/2014   CLINICAL DATA:  Central line placement.  EXAM: PORTABLE CHEST - 1 VIEW  COMPARISON:  02/22/2014  FINDINGS: Sternotomy wires are present. Nasogastric tube has tip and side-port over the stomach in the left upper quadrant. Left IJ central venous catheter has tip in the  region of the cavoatrial junction. Right IJ central venous catheter has tip over the region  of the SVC.  Lungs are hypoinflated with mild linear density in the left base likely atelectasis. There is subtle density in the medial right base likely atelectasis. No definite effusion or pneumothorax. Cardiomediastinal silhouette and remainder of the exam is unchanged.  IMPRESSION: Hypoinflation with mild bibasilar opacification likely atelectasis.  Tubes and lines as described.  No definite pneumothorax.   Electronically Signed   By: Marin Olp M.D.   On: 05/03/2014 18:53         Subjective: Patient states that abdominal pain is improving. He has some dyspnea on exertion when walking the halls. Denies any fevers, chills, chest pain, vomiting, dizziness, syncope, coughing, hemoptysis.  Objective: Filed Vitals:   05/10/14 0900 05/10/14 1000 05/10/14 1100 05/10/14 1432  BP: 141/86 132/88 181/85 125/87  Pulse:  113 62 112  Temp:   97.3 F (36.3 C) 97.4 F (36.3 C)  TempSrc:   Oral Oral  Resp: 30 21 20 20   Height:      Weight:      SpO2: 98% 100% 99% 96%    Intake/Output Summary (Last 24 hours) at 05/10/14 1833 Last data filed at 05/10/14 1815  Gross per 24 hour  Intake 4646.5 ml  Output    475 ml  Net 4171.5 ml   Weight change:  Exam:   General:  Pt is alert, follows commands appropriately, not in acute distress  HEENT: No icterus, No thrush,  [place]/AT  Cardiovascular: RRR, S1/S2, no rubs, no gallops  Respiratory: Bibasilar crackles. No wheezing. Good air movement  Abdomen: Soft/+BS, non tender, non distended, no guarding  Extremities: 2+LE edema, No lymphangitis, No petechiae, No rashes, no synovitis  Data Reviewed: Basic Metabolic Panel:  Recent Labs Lab 05/05/14 0425 05/06/14 0840 05/07/14 0438 05/08/14 0200 05/09/14 0525 05/10/14 0447  NA 131* 130* 131* 132* 135 137  K 4.1 4.5 4.0 4.2 3.8 3.7  CL 111 111 110 110 114* 115*  CO2 17* 17* 18* 17* 17* 16*   GLUCOSE 229* 239* 96 89 81 107*  BUN 27* 19 15 16 16 14   CREATININE 0.87 0.71 0.66 0.88 0.76 0.79  CALCIUM 7.2* 7.2* 7.5* 7.4* 7.3* 7.2*  MG 1.7  --   --   --   --   --    Liver Function Tests: No results for input(s): AST, ALT, ALKPHOS, BILITOT, PROT, ALBUMIN in the last 168 hours. No results for input(s): LIPASE, AMYLASE in the last 168 hours. No results for input(s): AMMONIA in the last 168 hours. CBC:  Recent Labs Lab 05/03/14 2105 05/04/14 0510  05/06/14 0840 05/07/14 0438 05/08/14 0200 05/09/14 0525 05/10/14 0447  WBC 2.3* 1.9*  < > 4.7 9.6 11.6* 8.6 11.4*  NEUTROABS 1.6* 1.2*  --   --   --   --   --   --   HGB 10.3* 9.3*  < > 9.1* 11.1* 10.2* 11.1* 9.6*  HCT 32.1* 28.4*  < > 27.4* 33.4* 31.5* 34.4* 29.6*  MCV 86.5 85.5  < > 83.8 82.9 84.5 85.4 85.3  PLT 133* 128*  < > 158 245 228 170 185  < > = values in this interval not displayed. Cardiac Enzymes: No results for input(s): CKTOTAL, CKMB, CKMBINDEX, TROPONINI in the last 168 hours. BNP: Invalid input(s): POCBNP CBG:  Recent Labs Lab 05/03/14 1955  GLUCAP 147*    Recent Results (from the past 240 hour(s))  TECHNOLOGIST REVIEW     Status: None   Collection Time: 05/02/14  3:10 PM  Result Value Ref Range Status   Technologist Review Rare meta  Final  Urine culture     Status: None   Collection Time: 05/03/14 12:39 AM  Result Value Ref Range Status   Specimen Description URINE, RANDOM  Final   Special Requests NONE  Final   Colony Count NO GROWTH Performed at [facility]   Final   Culture NO GROWTH Performed at [facility]   Final   Report Status 05/04/2014 FINAL  Final  Anaerobic culture     Status: None   Collection Time: 05/03/14  3:26 PM  Result Value Ref Range Status   Specimen Description PERITONEAL  Final   Special Requests NONE  Final   Gram Stain   Final    NO WBC SEEN RARE GRAM NEGATIVE RODS Performed at [facility]    Culture   Final    NO ANAEROBES  ISOLATED Performed at [facility]    Report Status 05/08/2014 FINAL  Final  MRSA PCR Screening     Status: None   Collection Time: 05/03/14  8:27 PM  Result Value Ref Range Status   MRSA by PCR NEGATIVE NEGATIVE Final    Comment:        The GeneXpert MRSA Assay (FDA approved for NASAL specimens only), is one component of a comprehensive MRSA colonization surveillance program. It is not intended to diagnose MRSA infection nor to guide or monitor treatment for MRSA infections.      Scheduled Meds: . sodium chloride   Intravenous Once  . sodium chloride   Intravenous Once  . sodium chloride  10 mL/hr Intravenous Once  . antiseptic oral rinse  7 mL Mouth Rinse BID  . enoxaparin (LOVENOX) injection  40 mg Subcutaneous Q24H  . levofloxacin  750 mg Oral Q24H  . metoprolol tartrate  75 mg Oral BID  . metroNIDAZOLE  500 mg Oral 3 times per day  . [START ON 05/11/2014] pantoprazole  40 mg Oral Daily  . sodium chloride  10 mL Intravenous Q12H   Continuous Infusions: . sodium chloride 10 mL/hr at 05/10/14 0600  . 0.9 % NaCl with KCl 20 mEq / L 50 mL/hr (05/10/14 1730)     Dollie Mayse, DO  [facility] Pager [phone]  If 7PM-7AM, please contact night-coverage [email or web address] Password [facility] 05/10/2014, 6:33 PM   LOS: 8 days

## 2014-05-10 NOTE — Clinical Documentation Improvement (Addendum)
"  Malnutrition" documented in current record.  Please provide the severity of malnutrition and type, if known, and document in your progress note and discharge summary.    . Severity: --Mild (first degree) --Moderate (second degree) --Severe (third degree) (Avoid documenting a range of severity, such as "moderate to severe")   Thank you, Mateo Flow, RN [phone] Clinical Documentation Specialist

## 2014-05-10 NOTE — Consult Note (Signed)
WOC ostomy follow up: Wife present for ostomy teaching session Stoma type/location: LLQ colostomy Stomal assessment/size: 1 and 3/4 inches round, red, moist and budded.  Slightly edematous. Os at center. Peristomal assessment: intact, clear Treatment options for stomal/peristomal skin: skin barrier ring Output dark green liquid effluent Ostomy pouching: 2pc, 2 and 1/4 ostomy pouching system with skin barrier ring.  Education provided: Patient and wife instructed on rationale for sizing stoma and placement of a skin barrier ring today. Discussed emptying and pouch change regimen.  Requested to be registered with [facility] for sampling of both opaque pouches with an integrated gas filter and the closed end pouches. Enrolled patient in [place] Start Discharge program: Yes  WOC wound consult note Reason for Consult:Patient's scrotum is edematous and with early erythema suggestive of fungal overgrowth. Wound type:no open area. Pressure Ulcer POA: No Measurement:No wound Wound bed:No open wound. Skin as described above. Drainage (amount, consistency, odor) None Periwound:Erythematous and edematous scrotum Dressing procedure/placement/frequency: I have provided conservative skin care orders directed at elevating the scrotum with an antimicrobial textile. If you suspect fungus, suggest systemic treatment (i.e., Diflucan) over powders and creams/ointments unless medically contraindicated.  If you agree, please order.  [place] nursing team will continue to follow for ostomy needs. Next pouch change due on Monday, January 4. Thanks, Maudie Flakes, MSN, RN, [place], [place], [place] [phone])

## 2014-05-10 NOTE — Progress Notes (Signed)
Physical Therapy Treatment Patient Details Name: Frank [name] MRN: [id number] DOB: 06-06-1945 Today's Date: 05/10/2014    History of Present Illness Abdominal exploration with distal left colon and sigmoid resection with end left colon colostomy 05/03/14.     PT Comments    Progressing with mobility and activity tolerance. Fatigues and becomes short of breath fairly easily. Noted pt to have increased edema in bil LEs and pt reports in scrotal area as well-wife made MD aware. HR into 140s with ambulation.   Follow Up Recommendations  Home health PT     Equipment Recommendations  Rolling walker with 5" wheels    Recommendations for Other Services OT consult     Precautions / Restrictions Precautions Precautions: Fall Precaution Comments: s/p abd surgery Restrictions Weight Bearing Restrictions: No    Mobility  Bed Mobility Overal bed mobility: Needs Assistance Bed Mobility: Sit to Sidelying         Sit to sidelying: Mod assist General bed mobility comments: Assist for bil LEs. Legs very heavy due to increased edema.   Transfers Overall transfer level: Needs assistance Equipment used: Rolling walker (2 wheeled) Transfers: Sit to/from Stand Sit to Stand: Min guard         General transfer comment: close guard for safety. VCs safety, hand placement  Ambulation/Gait Ambulation/Gait assistance: Min guard Ambulation Distance (Feet): 250 Feet Assistive device: Rolling walker (2 wheeled) Gait Pattern/deviations: Step-through pattern;Decreased stride length     General Gait Details: HR into 14os with ambulation. dyspnea 2/4. fatigues fairly easily.    Stairs            Wheelchair Mobility    Modified Rankin (Stroke Patients Only)       Balance                                    Cognition Arousal/Alertness: Awake/alert Behavior During Therapy: WFL for tasks assessed/performed Overall Cognitive Status: Within Functional Limits for  tasks assessed                      Exercises      General Comments        Pertinent Vitals/Pain Pain Assessment: 0-10 Pain Score: 5  Pain Location: abdomen Pain Descriptors / Indicators: Sore Pain Intervention(s): Monitored during session    Home Living                      Prior Function            PT Goals (current goals can now be found in the care plan section) Progress towards PT goals: Progressing toward goals    Frequency  Min 3X/week    PT Plan Current plan remains appropriate    Co-evaluation             End of Session   Activity Tolerance: Patient limited by fatigue Patient left: in bed;with call bell/phone within reach;with family/visitor present     Time: 1430-1450 PT Time Calculation (min) (ACUTE ONLY): 20 min  Charges:  $Gait Training: 8-22 mins                    G Codes:      Weston Anna, MPT Pager: [phone]

## 2014-05-10 NOTE — Progress Notes (Signed)
Pt HR noted to be 140s with ambulation though pt denies CP/pressure/SOB. HR recovers to low 100s after ambulation. Dr Tat aware, states this has been pt's norm since he began ambulating and he is comfortable with it.

## 2014-05-11 DIAGNOSIS — N508 Other specified disorders of male genital organs: Secondary | ICD-10-CM

## 2014-05-11 DIAGNOSIS — N5089 Other specified disorders of the male genital organs: Secondary | ICD-10-CM | POA: Insufficient documentation

## 2014-05-11 DIAGNOSIS — E8770 Fluid overload, unspecified: Secondary | ICD-10-CM | POA: Diagnosis not present

## 2014-05-11 LAB — CBC
HCT: 31.5 % — ABNORMAL LOW (ref 39.0–52.0)
HEMOGLOBIN: 10 g/dL — AB (ref 13.0–17.0)
MCH: 27.5 pg (ref 26.0–34.0)
MCHC: 31.7 g/dL (ref 30.0–36.0)
MCV: 86.5 fL (ref 78.0–100.0)
PLATELETS: 196 10*3/uL (ref 150–400)
RBC: 3.64 MIL/uL — ABNORMAL LOW (ref 4.22–5.81)
RDW: 17.6 % — ABNORMAL HIGH (ref 11.5–15.5)
WBC: 11.7 10*3/uL — ABNORMAL HIGH (ref 4.0–10.5)

## 2014-05-11 LAB — BASIC METABOLIC PANEL
ANION GAP: 5 (ref 5–15)
BUN: 13 mg/dL (ref 6–23)
CHLORIDE: 117 meq/L — AB (ref 96–112)
CO2: 16 mmol/L — ABNORMAL LOW (ref 19–32)
Calcium: 7.4 mg/dL — ABNORMAL LOW (ref 8.4–10.5)
Creatinine, Ser: 0.64 mg/dL (ref 0.50–1.35)
Glucose, Bld: 106 mg/dL — ABNORMAL HIGH (ref 70–99)
POTASSIUM: 3.6 mmol/L (ref 3.5–5.1)
SODIUM: 138 mmol/L (ref 135–145)

## 2014-05-11 MED ORDER — FUROSEMIDE 10 MG/ML IJ SOLN
40.0000 mg | Freq: Two times a day (BID) | INTRAMUSCULAR | Status: DC
  Administered 2014-05-11 – 2014-05-12 (×2): 40 mg via INTRAVENOUS
  Filled 2014-05-11 (×2): qty 4

## 2014-05-11 MED ORDER — SODIUM BICARBONATE 8.4 % IV SOLN
INTRAVENOUS | Status: DC
  Administered 2014-05-11: 11:00:00 via INTRAVENOUS
  Filled 2014-05-11: qty 1000

## 2014-05-11 NOTE — Progress Notes (Signed)
[facility] PROGRESS NOTE  KA FLAMMER MHD:[id number] DOB: 1945/12/30 DOA: 05/02/2014 PCP: [name], MD  Brief history 69 y.o. male with Past medical history of metastatic colon cancer to liver, hypertension, coronary artery disease status post CABG, diastolic dysfunction presented with abdominal pain, nausea, and vomiting with generalized weakness. CT of the abdomen and pelvis on the day of admission revealed sigmoid colonic perforation with diffuse Lahey rim enhancing large volume ascites. General surgery was consulted, and the patient was taken to the operating room where he had a hemicolectomy and end colostomy on 05/03/2014. The patient's diet was gradually advanced as his bowel function returned. Palliative medicine was consulted and the patient was changed to DO NOT RESUSCITATE. Although there has been discussion regarding stopping any further chemotherapy and going home with hospice, the patient has not yet made a definitive decision. Dr. [name] continues to follow.   Assessment/Plan: #1 abdominal pain nausea vomiting secondary to perforated sigmoid colon carcinoma with multiple interloop abscesses and advanced metastatic disease to the liver Patient is status post abdominal exploration with distal left colon and sigmoid colon resection and and left colon colostomy. Some clinical improvement. Patient is currently afebrile. Patient tolerating a liquid diet. Diet has been advanced per general surgery. Continue current oral antibiotics through 05/12/2014. Normal saline lock IV fluids.  #2 volume overload/scrotal edema Likely secondary to aggressive fluid hydration. Patient with 3+ bilateral lower extremity edema and scrotal edema. Scrotal ultrasound with marketed scrotal wall edema without abscess. No acute intrascrotal finding. We'll normal saline lock IV fluids. Place patient on Lasix 40 mg IV every 12 hours. Follow.  #3 left upper extremity edema Doppler ultrasound negative  for DVT. Will place on IV Lasix.  #4 pancytopenia Secondary to chemotherapy. Counts improving. Patient has been seen by oncology. Patient lost almost 1 L of blood during surgery and received 2 units of packed red blood cells. Due to patient's history of coronary artery disease goal is to keep hemoglobin greater than 8. Follow.  #5 tachycardia Was thought initially to be secondary to pain blood loss and dehydration. Patient in normal sinus rhythm. Continue current regimen of  beta blocker. Normal saline lock IV fluids. Follow.  #6 hypertension Improved. Continue current regimen of Lopressor.  #7 chronic diastolic heart failure Patient looks volume overloaded on examination. Patient's current weight is 96.8 kg. Patient's weight on admission was approximately 78.6 kg. We'll normal saline lock IV fluids. Place patient on Lasix 40 mg IV every 12 hours. Strict I's and O's. Daily weights. Continue beta blocker. Follow.  #8 history of coronary artery disease status post CABG Patient with some shortness of breath on ambulation however denies any chest pain. Continue beta blocker. Will place on IV Lasix secondary to volume overload.  #9 metastatic colon cancer Outpatient follow-up with oncology.  #10 goals of care Patient has been seen by palliative care. Patient is currently DO NOT RESUSCITATE. Patient is still considering options in terms of further treatment for his colon cancer. Patient's plan for now is to go home with home health therapies.  #11 prophylaxis PPI for GI prophylaxis. Lovenox for DVT prophylaxis.  Code Status: DO NOT RESUSCITATE Family Communication: Updated patient and wife at bedside. Disposition Plan: Home when medically stable with home health therapies   Consultants:  Palliative care: Dr. [name] 05/03/2014  Oncology: Dr. should that 05/03/2014   Gen. surgery: Dr. Hassell Done 05/03/2014  Procedures:  CT abdomen and pelvis 05/02/2014  CT head 05/02/2014  Chest  x-ray 05/03/2014  Scrotal  ultrasound 05/10/2014  Abdominal exploration with distal left colon and sigmoid colon resection and and left colon colostomy 05/03/2014 per Dr. Lucia Gaskins  2 units packed red blood cells  Left upper extremity Doppler ultrasound 05/09/2014  Antibiotics:  Oral Levaquin 05/10/2014  Oral Flagyl 05/10/2014  IV cefepime 05/03/2014>>>>> 05/10/2014  IV ciprofloxacin 05/03/2014>>> 05/03/2014  IV Flagyl 05/03/2014>>>>> 05/10/2014    HPI/Subjective: Patient states some shortness of breath on ambulation. Patient denies any abdominal pain however states has some abdominal discomfort. No nausea, no emesis. Patient tolerating current diet.  Objective: Filed Vitals:   05/11/14 1349  BP: 121/73  Pulse: 76  Temp: 97.6 F (36.4 C)  Resp: 19    Intake/Output Summary (Last 24 hours) at 05/11/14 1527 Last data filed at 05/11/14 0830  Gross per 24 hour  Intake   1470 ml  Output    400 ml  Net   1070 ml   Filed Weights   05/08/14 0500 05/09/14 2000 05/10/14 0455  Weight: 93.2 kg (205 lb 7.5 oz) 96.2 kg (212 lb 1.3 oz) 96.8 kg (213 lb 6.5 oz)    Exam:   General: NAD   Cardiovascular: RRR  Respiratory: Decreased breath sounds in the bases. No rhonchi.  Abdomen: Soft, colostomy with some stool in the pouch, positive bowel sounds, nontender to palpation.  Musculoskeletal: No clubbing or cyanosis. 3+ bilateral lower extremity edema.  Data Reviewed: Basic Metabolic Panel:  Recent Labs Lab 05/05/14 0425  05/07/14 0438 05/08/14 0200 05/09/14 0525 05/10/14 0447 05/11/14 0530  NA 131*  < > 131* 132* 135 137 138  K 4.1  < > 4.0 4.2 3.8 3.7 3.6  CL 111  < > 110 110 114* 115* 117*  CO2 17*  < > 18* 17* 17* 16* 16*  GLUCOSE 229*  < > 96 89 81 107* 106*  BUN 27*  < > 15 16 16 14 13   CREATININE 0.87  < > 0.66 0.88 0.76 0.79 0.64  CALCIUM 7.2*  < > 7.5* 7.4* 7.3* 7.2* 7.4*  MG 1.7  --   --   --   --   --   --   < > = values in this interval not  displayed. Liver Function Tests: No results for input(s): AST, ALT, ALKPHOS, BILITOT, PROT, ALBUMIN in the last 168 hours. No results for input(s): LIPASE, AMYLASE in the last 168 hours. No results for input(s): AMMONIA in the last 168 hours. CBC:  Recent Labs Lab 05/07/14 0438 05/08/14 0200 05/09/14 0525 05/10/14 0447 05/11/14 0530  WBC 9.6 11.6* 8.6 11.4* 11.7*  HGB 11.1* 10.2* 11.1* 9.6* 10.0*  HCT 33.4* 31.5* 34.4* 29.6* 31.5*  MCV 82.9 84.5 85.4 85.3 86.5  PLT 245 228 170 185 196   Cardiac Enzymes: No results for input(s): CKTOTAL, CKMB, CKMBINDEX, TROPONINI in the last 168 hours. BNP (last 3 results) No results for input(s): PROBNP in the last 8760 hours. CBG: No results for input(s): GLUCAP in the last 168 hours.  Recent Results (from the past 240 hour(s))  TECHNOLOGIST REVIEW     Status: None   Collection Time: 05/02/14  3:10 PM  Result Value Ref Range Status   Technologist Review Rare meta  Final  Urine culture     Status: None   Collection Time: 05/03/14 12:39 AM  Result Value Ref Range Status   Specimen Description URINE, RANDOM  Final   Special Requests NONE  Final   Colony Count NO GROWTH Performed at [facility]  Final   Culture NO GROWTH Performed at [facility]   Final   Report Status 05/04/2014 FINAL  Final  Anaerobic culture     Status: None   Collection Time: 05/03/14  3:26 PM  Result Value Ref Range Status   Specimen Description PERITONEAL  Final   Special Requests NONE  Final   Gram Stain   Final    NO WBC SEEN RARE GRAM NEGATIVE RODS Performed at [facility]    Culture   Final    NO ANAEROBES ISOLATED Performed at [facility]    Report Status 05/08/2014 FINAL  Final  MRSA PCR Screening     Status: None   Collection Time: 05/03/14  8:27 PM  Result Value Ref Range Status   MRSA by PCR NEGATIVE NEGATIVE Final    Comment:        The GeneXpert MRSA Assay (FDA approved for NASAL specimens only), is  one component of a comprehensive MRSA colonization surveillance program. It is not intended to diagnose MRSA infection nor to guide or monitor treatment for MRSA infections.      Studies: US Scrotum  05/11/2014   CLINICAL DATA:  Scrotal edema  EXAM: SCROTAL ULTRASOUND  DOPPLER ULTRASOUND OF THE TESTICLES  TECHNIQUE: Complete ultrasound examination of the testicles, epididymis, and other scrotal structures was performed. Color and spectral Doppler ultrasound were also utilized to evaluate blood flow to the testicles.  COMPARISON:  None.  FINDINGS: Right testicle  Measurements: 2.9 x 2.1 x 2.3 cm. Two coarse calcifications noted. A curvilinear hypoechoic structure at the lower pole on the sagittal projection is likely a vein. No mass was seen on transverse imaging and no mass was seen during real-time sonographer scan.  Left testicle  Measurements: 2.8 x 2.4 x 2.2 cm. No mass or microlithiasis visualized.  Right epididymis: 9 mm cyst in the right epididymis, incidental. No enlargement or abnormal vascularity.  Left epididymis:  No enlargement or abnormal vascularity.  Hydrocele:  Small and simple bilateral hydrocele.  Varicocele:  None visualized.  Pulsed Doppler interrogation of both testes demonstrates low resistance arterial and venous waveforms bilaterally.  There is herniated fat herniation the left inguinal canal which is known based on previous CT.  The scrotal wall is diffusely edematous and thickened. There is no bright spectral reflectors suggestive of emphysema. No evidence of abscess.  IMPRESSION: 1. Marked scrotal wall edema without abscess. 2. No acute intrascrotal finding.   Electronically Signed   By: Jorje Guild M.D.   On: 05/11/2014 01:35   [place] Art/ven Flow Abd Pelv Doppler  05/11/2014   CLINICAL DATA:  Scrotal edema  EXAM: SCROTAL ULTRASOUND  DOPPLER ULTRASOUND OF THE TESTICLES  TECHNIQUE: Complete ultrasound examination of the testicles, epididymis, and other scrotal structures was  performed. Color and spectral Doppler ultrasound were also utilized to evaluate blood flow to the testicles.  COMPARISON:  None.  FINDINGS: Right testicle  Measurements: 2.9 x 2.1 x 2.3 cm. Two coarse calcifications noted. A curvilinear hypoechoic structure at the lower pole on the sagittal projection is likely a vein. No mass was seen on transverse imaging and no mass was seen during real-time sonographer scan.  Left testicle  Measurements: 2.8 x 2.4 x 2.2 cm. No mass or microlithiasis visualized.  Right epididymis: 9 mm cyst in the right epididymis, incidental. No enlargement or abnormal vascularity.  Left epididymis:  No enlargement or abnormal vascularity.  Hydrocele:  Small and simple bilateral hydrocele.  Varicocele:  None visualized.  Pulsed Doppler interrogation of both testes demonstrates low resistance arterial and venous waveforms bilaterally.  There is herniated fat herniation the left inguinal canal which is known based on previous CT.  The scrotal wall is diffusely edematous and thickened. There is no bright spectral reflectors suggestive of emphysema. No evidence of abscess.  IMPRESSION: 1. Marked scrotal wall edema without abscess. 2. No acute intrascrotal finding.   Electronically Signed   By: Jorje Guild M.D.   On: 05/11/2014 01:35    Scheduled Meds: . sodium chloride   Intravenous Once  . sodium chloride   Intravenous Once  . sodium chloride  10 mL/hr Intravenous Once  . antiseptic oral rinse  7 mL Mouth Rinse BID  . enoxaparin (LOVENOX) injection  40 mg Subcutaneous Q24H  . furosemide  40 mg Intravenous Q12H  . levofloxacin  750 mg Oral Q24H  . metoprolol tartrate  75 mg Oral BID  . metroNIDAZOLE  500 mg Oral 3 times per day  . pantoprazole  40 mg Oral Daily  . sodium chloride  10 mL Intravenous Q12H   Continuous Infusions: . sodium chloride 10 mL/hr at 05/10/14 0600    Principal Problem:   Perforation of colon cancer s/p colectomy/colostomy 05/03/2014 Active Problems:    Liver masses   Acute blood loss anemia   Malignant neoplasm of colon   Abdominal pain   Dehydration   Mucositis due to antineoplastic therapy   Intractable nausea and vomiting   Acute encephalopathy   Perforated bowel   Palliative care encounter   Essential hypertension   Colon cancer   AP (abdominal pain)    Time spent: 88 mins    THOMPSON,DANIEL MD [facility] Pager [phone]. If 7PM-7AM, please contact night-coverage at [email or web address], password [facility] 05/11/2014, 3:27 PM  LOS: 9 days

## 2014-05-11 NOTE — Progress Notes (Signed)
Patient ID: Frank [name], male   DOB: 19-Nov-1945, 69 y.o.   MRN: [id number]  [facility] - Progress Note  POD# 8  Subjective: Patient comfortable, mild confusion.  Taking liquid diet.  No pain.  Objective: Vital signs in last 24 hours: Temp:  [97.3 F (36.3 C)-97.5 F (36.4 C)] 97.5 F (36.4 C) (12/31 2011) Pulse Rate:  [62-113] 102 (01/01 0431) Resp:  [20-30] 20 (01/01 0431) BP: (125-181)/(77-88) 145/80 mmHg (01/01 0431) SpO2:  [96 %-100 %] 98 % (01/01 0431) Last BM Date: 05/10/14  Intake/Output from previous day: 12/31 0701 - 01/01 0700 In: 2741.5 [P.O.:240; I.V.:2351.5; IV Piggyback:150] Out: 625 [Urine:625]  Exam: HEENT - clear, not icteric Neck - soft Chest - clear bilaterally Cor - RRR, no murmur Abd - soft without distension; stoma pink and viable left mid abdomen; no output in bag; BS present; dressing dry and intact Ext - no significant edema Neuro - grossly intact, no focal deficits  Lab Results:   Recent Labs  05/10/14 0447 05/11/14 0530  WBC 11.4* 11.7*  HGB 9.6* 10.0*  HCT 29.6* 31.5*  PLT 185 196     Recent Labs  05/10/14 0447 05/11/14 0530  NA 137 138  K 3.7 3.6  CL 115* 117*  CO2 16* 16*  GLUCOSE 107* 106*  BUN 14 13  CREATININE 0.79 0.64  CALCIUM 7.2* 7.4*    Studies/Results: US Scrotum  05/11/2014   CLINICAL DATA:  Scrotal edema  EXAM: SCROTAL ULTRASOUND  DOPPLER ULTRASOUND OF THE TESTICLES  TECHNIQUE: Complete ultrasound examination of the testicles, epididymis, and other scrotal structures was performed. Color and spectral Doppler ultrasound were also utilized to evaluate blood flow to the testicles.  COMPARISON:  None.  FINDINGS: Right testicle  Measurements: 2.9 x 2.1 x 2.3 cm. Two coarse calcifications noted. A curvilinear hypoechoic structure at the lower pole on the sagittal projection is likely a vein. No mass was seen on transverse imaging and no mass was seen during real-time sonographer  scan.  Left testicle  Measurements: 2.8 x 2.4 x 2.2 cm. No mass or microlithiasis visualized.  Right epididymis: 9 mm cyst in the right epididymis, incidental. No enlargement or abnormal vascularity.  Left epididymis:  No enlargement or abnormal vascularity.  Hydrocele:  Small and simple bilateral hydrocele.  Varicocele:  None visualized.  Pulsed Doppler interrogation of both testes demonstrates low resistance arterial and venous waveforms bilaterally.  There is herniated fat herniation the left inguinal canal which is known based on previous CT.  The scrotal wall is diffusely edematous and thickened. There is no bright spectral reflectors suggestive of emphysema. No evidence of abscess.  IMPRESSION: 1. Marked scrotal wall edema without abscess. 2. No acute intrascrotal finding.   Electronically Signed   By: Jorje Guild M.D.   On: 05/11/2014 01:35   [place] Art/ven Flow Abd Pelv Doppler  05/11/2014   CLINICAL DATA:  Scrotal edema  EXAM: SCROTAL ULTRASOUND  DOPPLER ULTRASOUND OF THE TESTICLES  TECHNIQUE: Complete ultrasound examination of the testicles, epididymis, and other scrotal structures was performed. Color and spectral Doppler ultrasound were also utilized to evaluate blood flow to the testicles.  COMPARISON:  None.  FINDINGS: Right testicle  Measurements: 2.9 x 2.1 x 2.3 cm. Two coarse calcifications noted. A curvilinear hypoechoic structure at the lower pole on the sagittal projection is likely a vein. No mass was seen on transverse imaging and no mass was seen during real-time sonographer scan.  Left testicle  Measurements: 2.8 x 2.4 x 2.2 cm. No mass or microlithiasis visualized.  Right epididymis: 9 mm cyst in the right epididymis, incidental. No enlargement or abnormal vascularity.  Left epididymis:  No enlargement or abnormal vascularity.  Hydrocele:  Small and simple bilateral hydrocele.  Varicocele:  None visualized.  Pulsed Doppler interrogation of both testes demonstrates low resistance arterial  and venous waveforms bilaterally.  There is herniated fat herniation the left inguinal canal which is known based on previous CT.  The scrotal wall is diffusely edematous and thickened. There is no bright spectral reflectors suggestive of emphysema. No evidence of abscess.  IMPRESSION: 1. Marked scrotal wall edema without abscess. 2. No acute intrascrotal finding.   Electronically Signed   By: Jorje Guild M.D.   On: 05/11/2014 01:35    Assessment / Plan: 1.  Status post Hartmann's resection for perforated adenocarcinoma of colon with mets  Advance diet as tolerated  Encouraged OOB, ambulation  Wound care, stoma care Will follow with you.  Earnstine Regal, MD, [facility] Office: [phone]  05/11/2014

## 2014-05-11 NOTE — Progress Notes (Signed)
Palliative medicine consult done on 12/24 by Dr. [name] -overall his symptoms are being managed in terms of pain and he is tolerating PO intake. Options are still being considered in terms of further treatment of his colon cancer depending how he does over the next few days. If he is not going to pursue addition chemotherapy he would be an appropriate hospice referral. Patient and family aware of this option. Plan for now is home health and follow-up with oncology. He remains DNR with advance directives clarified. PMT will sign off for now. If his condition changes or there are additional palliative needs that arise prior to discharge [please re-consult our service or call [phone].  Lane Hacker, DO Palliative Medicine

## 2014-05-11 NOTE — Progress Notes (Signed)
Physical Therapy Treatment Patient Details Name: SYLUS STGERMAIN MRN: [id number] DOB: August 24, 1945 Today's Date: 05/11/2014    History of Present Illness Abdominal exploration with distal left colon and sigmoid resection with end left colon colostomy 05/03/14.     PT Comments    Continuing to mobilize well. Limited by HR and dyspnea/fatigue this session. Pt states he plans to walk up to 3 more times today with nursing.   Follow Up Recommendations  Home health PT;Supervision/Assistance - 24 hour     Equipment Recommendations  Rolling walker with 5" wheels    Recommendations for Other Services OT consult     Precautions / Restrictions Precautions Precautions: Fall Precaution Comments: s/p abd surgery Restrictions Weight Bearing Restrictions: No    Mobility  Bed Mobility               General bed mobility comments: pt oob in recliner.  Transfers Overall transfer level: Needs assistance     Sit to Stand: Min guard         General transfer comment: close guard for safety. VCs safety, hand placement. Increased time.   Ambulation/Gait Ambulation/Gait assistance: Min guard Ambulation Distance (Feet): 200 Feet Assistive device: Rolling walker (2 wheeled) Gait Pattern/deviations: Step-through pattern     General Gait Details: close guard assist. HR into 140s-RN aware. dyspnea 2/4. fatigues fairly easily.    Stairs            Wheelchair Mobility    Modified Rankin (Stroke Patients Only)       Balance                                    Cognition Arousal/Alertness: Awake/alert Behavior During Therapy: WFL for tasks assessed/performed Overall Cognitive Status: Within Functional Limits for tasks assessed                      Exercises      General Comments        Pertinent Vitals/Pain Pain Assessment: 0-10 Pain Score: 5  Pain Location: abdomen Pain Descriptors / Indicators: Sore Pain Intervention(s): Monitored during  session    Home Living                      Prior Function            PT Goals (current goals can now be found in the care plan section) Progress towards PT goals: Progressing toward goals    Frequency  Min 3X/week    PT Plan Current plan remains appropriate    Co-evaluation             End of Session   Activity Tolerance: Patient limited by fatigue Patient left: in chair;with call bell/phone within reach     Time: 1054-1105 PT Time Calculation (min) (ACUTE ONLY): 11 min  Charges:  $Gait Training: 8-22 mins                    G Codes:      Weston Anna, MPT Pager: [phone]

## 2014-05-12 LAB — BASIC METABOLIC PANEL
Anion gap: 6 (ref 5–15)
BUN: 13 mg/dL (ref 6–23)
CO2: 19 mmol/L (ref 19–32)
Calcium: 7.4 mg/dL — ABNORMAL LOW (ref 8.4–10.5)
Chloride: 115 mEq/L — ABNORMAL HIGH (ref 96–112)
Creatinine, Ser: 0.79 mg/dL (ref 0.50–1.35)
GFR calc Af Amer: >90 mL/min (ref >90)
GFR calc non Af Amer: 89 mL/min — ABNORMAL LOW (ref >90)
GLUCOSE: 125 mg/dL — AB (ref 70–99)
Potassium: 3.5 mmol/L (ref 3.5–5.1)
SODIUM: 140 mmol/L (ref 135–145)

## 2014-05-12 LAB — CBC
HEMATOCRIT: 30.5 % — AB (ref 39.0–52.0)
Hemoglobin: 9.7 g/dL — ABNORMAL LOW (ref 13.0–17.0)
MCH: 27.3 pg (ref 26.0–34.0)
MCHC: 31.8 g/dL (ref 30.0–36.0)
MCV: 85.9 fL (ref 78.0–100.0)
Platelets: 176 10*3/uL (ref 150–400)
RBC: 3.55 MIL/uL — ABNORMAL LOW (ref 4.22–5.81)
RDW: 17.8 % — ABNORMAL HIGH (ref 11.5–15.5)
WBC: 13.9 10*3/uL — ABNORMAL HIGH (ref 4.0–10.5)

## 2014-05-12 MED ORDER — FUROSEMIDE 10 MG/ML IJ SOLN: 60.0000 mg | Freq: Two times a day (BID) | INTRAMUSCULAR | Status: DC

## 2014-05-12 MED ORDER — FUROSEMIDE 10 MG/ML IJ SOLN
80.0000 mg | Freq: Two times a day (BID) | INTRAMUSCULAR | Status: AC
  Administered 2014-05-12 – 2014-05-13 (×2): 80 mg via INTRAVENOUS
  Filled 2014-05-12 (×2): qty 8

## 2014-05-12 MED ORDER — FUROSEMIDE 10 MG/ML IJ SOLN
20.0000 mg | Freq: Once | INTRAMUSCULAR | Status: AC
  Administered 2014-05-12: 20 mg via INTRAVENOUS

## 2014-05-12 MED ORDER — GI COCKTAIL ~~LOC~~
30.0000 mL | Freq: Three times a day (TID) | ORAL | Status: DC | PRN
  Administered 2014-05-12: 30 mL via ORAL
  Filled 2014-05-12 (×2): qty 30

## 2014-05-12 MED ORDER — POTASSIUM CHLORIDE CRYS ER 20 MEQ PO TBCR
40.0000 meq | EXTENDED_RELEASE_TABLET | Freq: Once | ORAL | Status: AC
  Administered 2014-05-12: 40 meq via ORAL
  Filled 2014-05-12 (×2): qty 2

## 2014-05-12 MED ORDER — FUROSEMIDE 10 MG/ML IJ SOLN
INTRAMUSCULAR | Status: AC
  Filled 2014-05-12: qty 2

## 2014-05-12 NOTE — Progress Notes (Signed)
Patient ID: Frank [name], male   DOB: 10-May-1946, 69 y.o.   MRN: [id number]  [facility] - Progress Note  POD# 9  Subjective: Patient in good spirits, tolerating regular diet for breakfast - 2 trays!  Objective: Vital signs in last 24 hours: Temp:  [97.6 F (36.4 C)-97.7 F (36.5 C)] 97.6 F (36.4 C) (01/02 0542) Pulse Rate:  [76-91] 91 (01/02 0542) Resp:  [19-20] 20 (01/02 0542) BP: (116-135)/(65-73) 135/65 mmHg (01/02 0542) SpO2:  [97 %-99 %] 99 % (01/02 0542) Weight:  [214 lb (97.07 kg)] 214 lb (97.07 kg) (01/02 0542) Last BM Date: 05/10/14  Intake/Output from previous day: 01/01 0701 - 01/02 0700 In: 240 [P.O.:120; I.V.:120] Out: 1850 [Urine:1800; Stool:50]  Exam: HEENT - clear, not icteric Neck - soft Chest - clear bilaterally Cor - RRR, no murmur Abd - soft, mild tenderness diffusely; dressing intact, wound changed this AM; stoma viable with liquid and soft stool in bag Ext - moderate diffuse edema Neuro - grossly intact, no focal deficits  Lab Results:   Recent Labs  05/11/14 0530 05/12/14 0500  WBC 11.7* 13.9*  HGB 10.0* 9.7*  HCT 31.5* 30.5*  PLT 196 176     Recent Labs  05/11/14 0530 05/12/14 0500  NA 138 140  K 3.6 3.5  CL 117* 115*  CO2 16* 19  GLUCOSE 106* 125*  BUN 13 13  CREATININE 0.64 0.79  CALCIUM 7.4* 7.4*    Studies/Results: US Scrotum  05/11/2014   CLINICAL DATA:  Scrotal edema  EXAM: SCROTAL ULTRASOUND  DOPPLER ULTRASOUND OF THE TESTICLES  TECHNIQUE: Complete ultrasound examination of the testicles, epididymis, and other scrotal structures was performed. Color and spectral Doppler ultrasound were also utilized to evaluate blood flow to the testicles.  COMPARISON:  None.  FINDINGS: Right testicle  Measurements: 2.9 x 2.1 x 2.3 cm. Two coarse calcifications noted. A curvilinear hypoechoic structure at the lower pole on the sagittal projection is likely a vein. No mass was seen on transverse  imaging and no mass was seen during real-time sonographer scan.  Left testicle  Measurements: 2.8 x 2.4 x 2.2 cm. No mass or microlithiasis visualized.  Right epididymis: 9 mm cyst in the right epididymis, incidental. No enlargement or abnormal vascularity.  Left epididymis:  No enlargement or abnormal vascularity.  Hydrocele:  Small and simple bilateral hydrocele.  Varicocele:  None visualized.  Pulsed Doppler interrogation of both testes demonstrates low resistance arterial and venous waveforms bilaterally.  There is herniated fat herniation the left inguinal canal which is known based on previous CT.  The scrotal wall is diffusely edematous and thickened. There is no bright spectral reflectors suggestive of emphysema. No evidence of abscess.  IMPRESSION: 1. Marked scrotal wall edema without abscess. 2. No acute intrascrotal finding.   Electronically Signed   By: Jorje Guild M.D.   On: 05/11/2014 01:35   [place] Art/ven Flow Abd Pelv Doppler  05/11/2014   CLINICAL DATA:  Scrotal edema  EXAM: SCROTAL ULTRASOUND  DOPPLER ULTRASOUND OF THE TESTICLES  TECHNIQUE: Complete ultrasound examination of the testicles, epididymis, and other scrotal structures was performed. Color and spectral Doppler ultrasound were also utilized to evaluate blood flow to the testicles.  COMPARISON:  None.  FINDINGS: Right testicle  Measurements: 2.9 x 2.1 x 2.3 cm. Two coarse calcifications noted. A curvilinear hypoechoic structure at the lower pole on the sagittal projection is likely a vein. No mass was seen on transverse imaging and no  mass was seen during real-time sonographer scan.  Left testicle  Measurements: 2.8 x 2.4 x 2.2 cm. No mass or microlithiasis visualized.  Right epididymis: 9 mm cyst in the right epididymis, incidental. No enlargement or abnormal vascularity.  Left epididymis:  No enlargement or abnormal vascularity.  Hydrocele:  Small and simple bilateral hydrocele.  Varicocele:  None visualized.  Pulsed Doppler  interrogation of both testes demonstrates low resistance arterial and venous waveforms bilaterally.  There is herniated fat herniation the left inguinal canal which is known based on previous CT.  The scrotal wall is diffusely edematous and thickened. There is no bright spectral reflectors suggestive of emphysema. No evidence of abscess.  IMPRESSION: 1. Marked scrotal wall edema without abscess. 2. No acute intrascrotal finding.   Electronically Signed   By: Jorje Guild M.D.   On: 05/11/2014 01:35    Assessment / Plan: 1. Status post Hartmann's resection for perforated adenocarcinoma of colon with mets Regular diet tolerated Encouraged OOB, ambulation Wound care, stoma care Will follow with you.  Earnstine Regal, MD, [facility] Office: [phone]  05/12/2014

## 2014-05-12 NOTE — Progress Notes (Signed)
[facility] PROGRESS NOTE  Frank [name] BUL:[id number] DOB: 10-24-1945 DOA: 05/02/2014 PCP: [name], MD  Brief history 69 y.o. male with Past medical history of metastatic colon cancer to liver, hypertension, coronary artery disease status post CABG, diastolic dysfunction presented with abdominal pain, nausea, and vomiting with generalized weakness. CT of the abdomen and pelvis on the day of admission revealed sigmoid colonic perforation with diffuse Lahey rim enhancing large volume ascites. General surgery was consulted, and the patient was taken to the operating room where he had a hemicolectomy and end colostomy on 05/03/2014. The patient's diet was gradually advanced as his bowel function returned. Palliative medicine was consulted and the patient was changed to DO NOT RESUSCITATE. Although there has been discussion regarding stopping any further chemotherapy and going home with hospice, the patient has not yet made a definitive decision. Dr. [name] continues to follow.   Assessment/Plan: #1 abdominal pain nausea vomiting secondary to perforated sigmoid colon carcinoma with multiple interloop abscesses and advanced metastatic disease to the liver Patient is status post abdominal exploration with distal left colon and sigmoid colon resection and and left colon colostomy. Some clinical improvement. Patient is currently afebrile. Patient tolerating a soft diet. Continue current oral antibiotics through 05/12/2014. Normal saline lock IV fluids.  #2 volume overload/scrotal edema Likely secondary to aggressive fluid hydration. Patient with 2-3+ bilateral lower extremity edema and scrotal edema. Scrotal ultrasound with marketed scrotal wall edema without abscess. No acute intrascrotal finding. We'll normal saline lock IV fluids. Increase Lasix to 80 mg IV every 12 hours. Follow.  #3 left upper extremity edema Doppler ultrasound negative for DVT. Continue IV Lasix.  #4  pancytopenia Secondary to chemotherapy. Counts improving. Patient has been seen by oncology. Patient lost almost 1 L of blood during surgery and received 2 units of packed red blood cells. Due to patient's history of coronary artery disease goal is to keep hemoglobin greater than 8. Follow.  #5 tachycardia Was thought initially to be secondary to pain blood loss and dehydration. Patient in normal sinus rhythm. Continue current regimen of  beta blocker. Normal saline lock IV fluids. Follow.  #6 hypertension Improved. Continue current regimen of Lopressor.  #7 chronic diastolic heart failure/volume overload Patient looks volume overloaded on examination. Patient's current weight is 96.8 kg. Patient's weight on admission was approximately 78.6 kg. We'll normal saline lock IV fluids. Increase Lasix 80 mg IV every 12 hours. Strict I's and O's. Daily weights. Continue beta blocker. Follow.  #8 history of coronary artery disease status post CABG Patient with some shortness of breath on ambulation however denies any chest pain. Continue beta blocker. Increase IV Lasix to 80 mg every 12 hours, secondary to volume overload.  #9 metastatic colon cancer Outpatient follow-up with oncology.  #10 goals of care Patient has been seen by palliative care. Patient is currently DO NOT RESUSCITATE. Patient is still considering options in terms of further treatment for his colon cancer. Patient's plan for now is to go home with home health therapies.  #11 prophylaxis PPI for GI prophylaxis. Lovenox for DVT prophylaxis.  Code Status: DO NOT RESUSCITATE Family Communication: Updated patient and wife at bedside. Disposition Plan: Home when medically stable with home health therapies   Consultants:  Palliative care: Dr. [name] 05/03/2014  Oncology: Dr. should that 05/03/2014   Gen. surgery: Dr. Hassell Done 05/03/2014  Procedures:  CT abdomen and pelvis 05/02/2014  CT head 05/02/2014  Chest x-ray  05/03/2014  Scrotal ultrasound 05/10/2014  Abdominal exploration with distal  left colon and sigmoid colon resection and and left colon colostomy 05/03/2014 per Dr. Lucia Gaskins  2 units packed red blood cells  Left upper extremity Doppler ultrasound 05/09/2014  Antibiotics:  Oral Levaquin 05/10/2014  Oral Flagyl 05/10/2014  IV cefepime 05/03/2014>>>>> 05/10/2014  IV ciprofloxacin 05/03/2014>>> 05/03/2014  IV Flagyl 05/03/2014>>>>> 05/10/2014    HPI/Subjective: Patient states some shortness of breath on ambulation. Patient denies any abdominal pain however states has some abdominal discomfort. No nausea, no emesis. Patient tolerating current diet.  Objective: Filed Vitals:   05/12/14 1019  BP: 114/71  Pulse: 101  Temp:   Resp:     Intake/Output Summary (Last 24 hours) at 05/12/14 1426 Last data filed at 05/12/14 1246  Gross per 24 hour  Intake    120 ml  Output   2450 ml  Net  -2330 ml   Filed Weights   05/09/14 2000 05/10/14 0455 05/12/14 0542  Weight: 96.2 kg (212 lb 1.3 oz) 96.8 kg (213 lb 6.5 oz) 97.07 kg (214 lb)    Exam:   General: NAD   Cardiovascular: RRR  Respiratory: Some bibasilar crackles. No rhonchi.  Abdomen: Soft, colostomy with some stool in the pouch, positive bowel sounds, nontender to palpation.  Musculoskeletal: No clubbing or cyanosis. 2-3+ bilateral lower extremity edema.  Data Reviewed: Basic Metabolic Panel:  Recent Labs Lab 05/08/14 0200 05/09/14 0525 05/10/14 0447 05/11/14 0530 05/12/14 0500  NA 132* 135 137 138 140  K 4.2 3.8 3.7 3.6 3.5  CL 110 114* 115* 117* 115*  CO2 17* 17* 16* 16* 19  GLUCOSE 89 81 107* 106* 125*  BUN 16 16 14 13 13   CREATININE 0.88 0.76 0.79 0.64 0.79  CALCIUM 7.4* 7.3* 7.2* 7.4* 7.4*   Liver Function Tests: No results for input(s): AST, ALT, ALKPHOS, BILITOT, PROT, ALBUMIN in the last 168 hours. No results for input(s): LIPASE, AMYLASE in the last 168 hours. No results for input(s): AMMONIA  in the last 168 hours. CBC:  Recent Labs Lab 05/08/14 0200 05/09/14 0525 05/10/14 0447 05/11/14 0530 05/12/14 0500  WBC 11.6* 8.6 11.4* 11.7* 13.9*  HGB 10.2* 11.1* 9.6* 10.0* 9.7*  HCT 31.5* 34.4* 29.6* 31.5* 30.5*  MCV 84.5 85.4 85.3 86.5 85.9  PLT 228 170 185 196 176   Cardiac Enzymes: No results for input(s): CKTOTAL, CKMB, CKMBINDEX, TROPONINI in the last 168 hours. BNP (last 3 results) No results for input(s): PROBNP in the last 8760 hours. CBG: No results for input(s): GLUCAP in the last 168 hours.  Recent Results (from the past 240 hour(s))  TECHNOLOGIST REVIEW     Status: None   Collection Time: 05/02/14  3:10 PM  Result Value Ref Range Status   Technologist Review Rare meta  Final  Urine culture     Status: None   Collection Time: 05/03/14 12:39 AM  Result Value Ref Range Status   Specimen Description URINE, RANDOM  Final   Special Requests NONE  Final   Colony Count NO GROWTH Performed at [facility]   Final   Culture NO GROWTH Performed at [facility]   Final   Report Status 05/04/2014 FINAL  Final  Anaerobic culture     Status: None   Collection Time: 05/03/14  3:26 PM  Result Value Ref Range Status   Specimen Description PERITONEAL  Final   Special Requests NONE  Final   Gram Stain   Final    NO WBC SEEN RARE GRAM NEGATIVE RODS Performed at [facility]    Culture   Final    NO ANAEROBES ISOLATED Performed at [facility]    Report Status 05/08/2014 FINAL  Final  MRSA PCR Screening     Status: None   Collection Time: 05/03/14  8:27 PM  Result Value Ref Range Status   MRSA by PCR NEGATIVE NEGATIVE Final    Comment:        The GeneXpert MRSA Assay (FDA approved for NASAL specimens only), is one component of a comprehensive MRSA colonization surveillance program. It is not intended to diagnose MRSA infection nor to guide or monitor treatment for MRSA infections.      Studies: US Scrotum  05/11/2014    CLINICAL DATA:  Scrotal edema  EXAM: SCROTAL ULTRASOUND  DOPPLER ULTRASOUND OF THE TESTICLES  TECHNIQUE: Complete ultrasound examination of the testicles, epididymis, and other scrotal structures was performed. Color and spectral Doppler ultrasound were also utilized to evaluate blood flow to the testicles.  COMPARISON:  None.  FINDINGS: Right testicle  Measurements: 2.9 x 2.1 x 2.3 cm. Two coarse calcifications noted. A curvilinear hypoechoic structure at the lower pole on the sagittal projection is likely a vein. No mass was seen on transverse imaging and no mass was seen during real-time sonographer scan.  Left testicle  Measurements: 2.8 x 2.4 x 2.2 cm. No mass or microlithiasis visualized.  Right epididymis: 9 mm cyst in the right epididymis, incidental. No enlargement or abnormal vascularity.  Left epididymis:  No enlargement or abnormal vascularity.  Hydrocele:  Small and simple bilateral hydrocele.  Varicocele:  None visualized.  Pulsed Doppler interrogation of both testes demonstrates low resistance arterial and venous waveforms bilaterally.  There is herniated fat herniation the left inguinal canal which is known based on previous CT.  The scrotal wall is diffusely edematous and thickened. There is no bright spectral reflectors suggestive of emphysema. No evidence of abscess.  IMPRESSION: 1. Marked scrotal wall edema without abscess. 2. No acute intrascrotal finding.   Electronically Signed   By: Jorje Guild M.D.   On: 05/11/2014 01:35   [place] Art/ven Flow Abd Pelv Doppler  05/11/2014   CLINICAL DATA:  Scrotal edema  EXAM: SCROTAL ULTRASOUND  DOPPLER ULTRASOUND OF THE TESTICLES  TECHNIQUE: Complete ultrasound examination of the testicles, epididymis, and other scrotal structures was performed. Color and spectral Doppler ultrasound were also utilized to evaluate blood flow to the testicles.  COMPARISON:  None.  FINDINGS: Right testicle  Measurements: 2.9 x 2.1 x 2.3 cm. Two coarse calcifications noted. A  curvilinear hypoechoic structure at the lower pole on the sagittal projection is likely a vein. No mass was seen on transverse imaging and no mass was seen during real-time sonographer scan.  Left testicle  Measurements: 2.8 x 2.4 x 2.2 cm. No mass or microlithiasis visualized.  Right epididymis: 9 mm cyst in the right epididymis, incidental. No enlargement or abnormal vascularity.  Left epididymis:  No enlargement or abnormal vascularity.  Hydrocele:  Small and simple bilateral hydrocele.  Varicocele:  None visualized.  Pulsed Doppler interrogation of both testes demonstrates low resistance arterial and venous waveforms bilaterally.  There is herniated fat herniation the left inguinal canal which is known based on previous CT.  The scrotal wall is diffusely edematous and thickened. There is no bright spectral reflectors suggestive of emphysema. No evidence of abscess.  IMPRESSION: 1. Marked scrotal wall edema without abscess. 2. No acute intrascrotal finding.   Electronically Signed   By: Gilford Silvius.D.  On: 05/11/2014 01:35    Scheduled Meds: . sodium chloride   Intravenous Once  . sodium chloride   Intravenous Once  . sodium chloride  10 mL/hr Intravenous Once  . antiseptic oral rinse  7 mL Mouth Rinse BID  . enoxaparin (LOVENOX) injection  40 mg Subcutaneous Q24H  . furosemide  80 mg Intravenous Q12H  . levofloxacin  750 mg Oral Q24H  . metoprolol tartrate  75 mg Oral BID  . metroNIDAZOLE  500 mg Oral 3 times per day  . pantoprazole  40 mg Oral Daily  . sodium chloride  10 mL Intravenous Q12H   Continuous Infusions:    Principal Problem:   Perforation of colon cancer s/p colectomy/colostomy 05/03/2014 Active Problems:   Liver masses   Acute blood loss anemia   Malignant neoplasm of colon   Abdominal pain   Dehydration   Mucositis due to antineoplastic therapy   Intractable nausea and vomiting   Acute encephalopathy   Perforated bowel   Palliative care encounter   Essential  hypertension   Colon cancer   AP (abdominal pain)   Volume overload   Scrotal edema    Time spent: 35 mins    Mina Babula MD [facility] Pager [phone]. If 7PM-7AM, please contact night-coverage at [email or web address], password [facility] 05/12/2014, 2:26 PM  LOS: 10 days

## 2014-05-13 DIAGNOSIS — E876 Hypokalemia: Secondary | ICD-10-CM

## 2014-05-13 LAB — BASIC METABOLIC PANEL
Anion gap: 6 (ref 5–15)
BUN: 12 mg/dL (ref 6–23)
CALCIUM: 7.5 mg/dL — AB (ref 8.4–10.5)
CO2: 21 mmol/L (ref 19–32)
CREATININE: 0.86 mg/dL (ref 0.50–1.35)
Chloride: 114 mEq/L — ABNORMAL HIGH (ref 96–112)
GFR calc Af Amer: >90 mL/min (ref >90)
GFR, EST NON AFRICAN AMERICAN: 86 mL/min — AB (ref >90)
Glucose, Bld: 121 mg/dL — ABNORMAL HIGH (ref 70–99)
Potassium: 3 mmol/L — ABNORMAL LOW (ref 3.5–5.1)
Sodium: 141 mmol/L (ref 135–145)

## 2014-05-13 LAB — CBC
HCT: 32.7 % — ABNORMAL LOW (ref 39.0–52.0)
HEMOGLOBIN: 10.5 g/dL — AB (ref 13.0–17.0)
MCH: 27.5 pg (ref 26.0–34.0)
MCHC: 32.1 g/dL (ref 30.0–36.0)
MCV: 85.6 fL (ref 78.0–100.0)
Platelets: 167 10*3/uL (ref 150–400)
RBC: 3.82 MIL/uL — ABNORMAL LOW (ref 4.22–5.81)
RDW: 18 % — ABNORMAL HIGH (ref 11.5–15.5)
WBC: 15 10*3/uL — AB (ref 4.0–10.5)

## 2014-05-13 LAB — MAGNESIUM: Magnesium: 1.3 mg/dL — ABNORMAL LOW (ref 1.5–2.5)

## 2014-05-13 MED ORDER — POTASSIUM CHLORIDE CRYS ER 20 MEQ PO TBCR
40.0000 meq | EXTENDED_RELEASE_TABLET | ORAL | Status: AC
  Administered 2014-05-13 (×2): 40 meq via ORAL
  Filled 2014-05-13 (×2): qty 2

## 2014-05-13 MED ORDER — MAGNESIUM SULFATE 4 GM/100ML IV SOLN
4.0000 g | Freq: Once | INTRAVENOUS | Status: AC
  Administered 2014-05-13: 4 g via INTRAVENOUS
  Filled 2014-05-13: qty 100

## 2014-05-13 MED ORDER — FUROSEMIDE 10 MG/ML IJ SOLN
60.0000 mg | Freq: Two times a day (BID) | INTRAMUSCULAR | Status: DC
  Administered 2014-05-13 – 2014-05-15 (×5): 60 mg via INTRAVENOUS
  Filled 2014-05-13 (×5): qty 6

## 2014-05-13 NOTE — Progress Notes (Signed)
[facility] PROGRESS NOTE  Frank [name] RSW:[id number] DOB: 13-Dec-1945 DOA: 05/02/2014 PCP: [name], MD  Brief history 69 y.o. male with Past medical history of metastatic colon cancer to liver, hypertension, coronary artery disease status post CABG, diastolic dysfunction presented with abdominal pain, nausea, and vomiting with generalized weakness. CT of the abdomen and pelvis on the day of admission revealed sigmoid colonic perforation with diffuse Lahey rim enhancing large volume ascites. General surgery was consulted, and the patient was taken to the operating room where he had a hemicolectomy and end colostomy on 05/03/2014. The patient's diet was gradually advanced as his bowel function returned. Palliative medicine was consulted and the patient was changed to DO NOT RESUSCITATE. Although there has been discussion regarding stopping any further chemotherapy and going home with hospice, the patient has not yet made a definitive decision. Dr. [name] continues to follow.   Assessment/Plan: #1 abdominal pain nausea vomiting secondary to perforated sigmoid colon carcinoma with multiple interloop abscesses and advanced metastatic disease to the liver Patient is status post abdominal exploration with distal left colon and sigmoid colon resection and and left colon colostomy. Some clinical improvement. Patient is currently afebrile. WBC trending back up. Patient tolerating a soft diet. Continue current oral antibiotics. Normal saline lock IV fluids. If further rise in WBC may need CT of the abdomen and pelvis to rule out abscess formation per general surgery. General surgery following and appreciate input and recommendations.  #2 volume overload/scrotal edema Likely secondary to aggressive fluid hydration. Patient with 2-3+ bilateral lower extremity edema and scrotal edema. Scrotal ultrasound with marketed scrotal wall edema without abscess. No acute intrascrotal finding. NSL IV fluids.  Decrease Lasix to 60 mg IV every 12 hours. Follow.  #3 left upper extremity edema Doppler ultrasound negative for DVT. Continue IV Lasix.  #4 pancytopenia Secondary to chemotherapy. Counts improving. Patient has been seen by oncology. Patient lost almost 1 L of blood during surgery and received 2 units of packed red blood cells. Due to patient's history of coronary artery disease goal is to keep hemoglobin greater than 8. Patient noted to have a leukocytosis although on antibiotics. White count now at 15,000. White count continues to increase may need CT of the abdomen and pelvis to rule out abscess per general surgery. Follow.  #5 tachycardia Was thought initially to be secondary to pain blood loss and dehydration. Patient in normal sinus rhythm. Continue current regimen of  beta blocker. Normal saline lock IV fluids. Follow.  #6 hypertension Improved. Continue current regimen of Lopressor.  #7 chronic diastolic heart failure/volume overload Patient looks volume overloaded on examination. Patient's current weight is 94.48kg from 96.8 kg. Patient's weight on admission was approximately 78.6 kg. NSL IV fluids. I/O = -2.630L.  Decrease Lasix 60 mg IV every 12 hours. Strict I's and O's. Daily weights. Continue beta blocker. Follow.  #8 history of coronary artery disease status post CABG Patient with some shortness of breath on ambulation however denies any chest pain. Continue beta blocker. Decrease IV Lasix to 60 mg every 12 hours, secondary to volume overload.  #9 metastatic colon cancer Outpatient follow-up with oncology.  #10 hypokalemia/hypomagnesemia Likely secondary to diureses. Replete.  #11 goals of care Patient has been seen by palliative care. Patient is currently DO NOT RESUSCITATE. Patient is still considering options in terms of further treatment for his colon cancer. Patient's plan for now is to go home with home health therapies.  #12 prophylaxis PPI for GI prophylaxis.  Lovenox for  DVT prophylaxis.  Code Status: DO NOT RESUSCITATE Family Communication: Updated patient, no family at bedside. Disposition Plan: Home when medically stable with home health therapies   Consultants:  Palliative care: Dr. [name] 05/03/2014  Oncology: Dr. should that 05/03/2014   Gen. surgery: Dr. Hassell Done 05/03/2014  Procedures:  CT abdomen and pelvis 05/02/2014  CT head 05/02/2014  Chest x-ray 05/03/2014  Scrotal ultrasound 05/10/2014  Abdominal exploration with distal left colon and sigmoid colon resection and and left colon colostomy 05/03/2014 per Dr. Lucia Gaskins  2 units packed red blood cells  Left upper extremity Doppler ultrasound 05/09/2014  Antibiotics:  Oral Levaquin 05/10/2014  Oral Flagyl 05/10/2014  IV cefepime 05/03/2014>>>>> 05/10/2014  IV ciprofloxacin 05/03/2014>>> 05/03/2014  IV Flagyl 05/03/2014>>>>> 05/10/2014    HPI/Subjective: Patient states shortness of breath improving. Patient denies any abdominal pain however states has some abdominal discomfort. No nausea, no emesis. Patient tolerating current diet.  Objective: Filed Vitals:   05/13/14 0616  BP: 104/68  Pulse: 88  Temp: 97.7 F (36.5 C)  Resp: 20    Intake/Output Summary (Last 24 hours) at 05/13/14 0947 Last data filed at 05/13/14 0827  Gross per 24 hour  Intake    600 ml  Output   3050 ml  Net  -2450 ml   Filed Weights   05/10/14 0455 05/12/14 0542 05/13/14 0616  Weight: 96.8 kg (213 lb 6.5 oz) 97.07 kg (214 lb) 94.484 kg (208 lb 4.8 oz)    Exam:   General: NAD   Cardiovascular: RRR  Respiratory: CTAB. No rhonchi.  Abdomen: Soft, colostomy with some stool in the pouch, positive bowel sounds, nontender to palpation.  Musculoskeletal: No clubbing or cyanosis. 2-3+ bilateral lower extremity edema improving.  Data Reviewed: Basic Metabolic Panel:  Recent Labs Lab 05/09/14 0525 05/10/14 0447 05/11/14 0530 05/12/14 0500 05/13/14 0608  NA 135 137  138 140 141  K 3.8 3.7 3.6 3.5 3.0*  CL 114* 115* 117* 115* 114*  CO2 17* 16* 16* 19 21  GLUCOSE 81 107* 106* 125* 121*  BUN 16 14 13 13 12   CREATININE 0.76 0.79 0.64 0.79 0.86  CALCIUM 7.3* 7.2* 7.4* 7.4* 7.5*  MG  --   --   --   --  1.3*   Liver Function Tests: No results for input(s): AST, ALT, ALKPHOS, BILITOT, PROT, ALBUMIN in the last 168 hours. No results for input(s): LIPASE, AMYLASE in the last 168 hours. No results for input(s): AMMONIA in the last 168 hours. CBC:  Recent Labs Lab 05/09/14 0525 05/10/14 0447 05/11/14 0530 05/12/14 0500 05/13/14 0608  WBC 8.6 11.4* 11.7* 13.9* 15.0*  HGB 11.1* 9.6* 10.0* 9.7* 10.5*  HCT 34.4* 29.6* 31.5* 30.5* 32.7*  MCV 85.4 85.3 86.5 85.9 85.6  PLT 170 185 196 176 167   Cardiac Enzymes: No results for input(s): CKTOTAL, CKMB, CKMBINDEX, TROPONINI in the last 168 hours. BNP (last 3 results) No results for input(s): PROBNP in the last 8760 hours. CBG: No results for input(s): GLUCAP in the last 168 hours.  Recent Results (from the past 240 hour(s))  Anaerobic culture     Status: None   Collection Time: 05/03/14  3:26 PM  Result Value Ref Range Status   Specimen Description PERITONEAL  Final   Special Requests NONE  Final   Gram Stain   Final    NO WBC SEEN RARE GRAM NEGATIVE RODS Performed at [facility]    Culture   Final    NO ANAEROBES ISOLATED Performed  at [facility]    Report Status 05/08/2014 FINAL  Final  MRSA PCR Screening     Status: None   Collection Time: 05/03/14  8:27 PM  Result Value Ref Range Status   MRSA by PCR NEGATIVE NEGATIVE Final    Comment:        The GeneXpert MRSA Assay (FDA approved for NASAL specimens only), is one component of a comprehensive MRSA colonization surveillance program. It is not intended to diagnose MRSA infection nor to guide or monitor treatment for MRSA infections.      Studies: No results found.  Scheduled Meds: . sodium chloride    Intravenous Once  . sodium chloride   Intravenous Once  . sodium chloride  10 mL/hr Intravenous Once  . antiseptic oral rinse  7 mL Mouth Rinse BID  . enoxaparin (LOVENOX) injection  40 mg Subcutaneous Q24H  . furosemide  60 mg Intravenous BID  . levofloxacin  750 mg Oral Q24H  . magnesium sulfate 1 - 4 g bolus IVPB  4 g Intravenous Once  . metoprolol tartrate  75 mg Oral BID  . metroNIDAZOLE  500 mg Oral 3 times per day  . pantoprazole  40 mg Oral Daily  . potassium chloride  40 mEq Oral Q4H  . sodium chloride  10 mL Intravenous Q12H   Continuous Infusions:    Principal Problem:   Perforation of colon cancer s/p colectomy/colostomy 05/03/2014 Active Problems:   Liver masses   Acute blood loss anemia   Malignant neoplasm of colon   Abdominal pain   Dehydration   Mucositis due to antineoplastic therapy   Intractable nausea and vomiting   Acute encephalopathy   Perforated bowel   Palliative care encounter   Essential hypertension   Colon cancer   AP (abdominal pain)   Volume overload   Scrotal edema    Time spent: 35 mins    Raj Landress MD [facility] Pager [phone]. If 7PM-7AM, please contact night-coverage at [email or web address], password [facility] 05/13/2014, 9:47 AM  LOS: 11 days

## 2014-05-13 NOTE — Progress Notes (Signed)
Patient ID: Frank [name], male   DOB: 02-28-1946, 69 y.o.   MRN: [id number]  [facility] - Progress Note  POD# 10  Subjective: Patient comfortable, napping.  Did not ambulate yesterday.  Tolerating regular diet.  Objective: Vital signs in last 24 hours: Temp:  [97.3 F (36.3 C)-97.7 F (36.5 C)] 97.7 F (36.5 C) (01/03 0616) Pulse Rate:  [74-101] 88 (01/03 0616) Resp:  [20] 20 (01/03 0616) BP: (104-118)/(65-71) 104/68 mmHg (01/03 0616) SpO2:  [94 %-99 %] 94 % (01/03 0616) Weight:  [208 lb 4.8 oz (94.484 kg)] 208 lb 4.8 oz (94.484 kg) (01/03 0616) Last BM Date: 05/13/14  Intake/Output from previous day: 01/02 0701 - 01/03 0700 In: 720 [P.O.:720] Out: 3350 [Urine:3200; Stool:150]  Exam: HEENT - clear, not icteric Abd - soft, minimal distension; BS present; stoma viable; dressing intact Ext - no significant edema Neuro - grossly intact, no focal deficits  Lab Results:   Recent Labs  05/12/14 0500 05/13/14 0608  WBC 13.9* 15.0*  HGB 9.7* 10.5*  HCT 30.5* 32.7*  PLT 176 167     Recent Labs  05/12/14 0500 05/13/14 0608  NA 140 141  K 3.5 3.0*  CL 115* 114*  CO2 19 21  GLUCOSE 125* 121*  BUN 13 12  CREATININE 0.79 0.86  CALCIUM 7.4* 7.5*    Studies/Results: No results found.  Assessment / Plan: 1. Status post Hartmann's resection for perforated adenocarcinoma of colon with mets Regular diet Encouraged OOB, ambulation Wound care, stoma care  Monitor WBC - now up to 15K - repeat in AM - if continues to rise, may need CT abdomen to rule out abscess formation  Will follow with you.  Earnstine Regal, MD, [facility] Office: [phone]  05/13/2014

## 2014-05-14 ENCOUNTER — Other Ambulatory Visit: Payer: Medicare Other

## 2014-05-14 ENCOUNTER — Encounter (HOSPITAL_COMMUNITY): Payer: Self-pay

## 2014-05-14 LAB — BASIC METABOLIC PANEL
Anion gap: 5 (ref 5–15)
BUN: 13 mg/dL (ref 6–23)
CHLORIDE: 110 meq/L (ref 96–112)
CO2: 22 mmol/L (ref 19–32)
Calcium: 7.3 mg/dL — ABNORMAL LOW (ref 8.4–10.5)
Creatinine, Ser: 0.87 mg/dL (ref 0.50–1.35)
GFR, EST NON AFRICAN AMERICAN: 85 mL/min — AB (ref >90)
Glucose, Bld: 131 mg/dL — ABNORMAL HIGH (ref 70–99)
POTASSIUM: 3.3 mmol/L — AB (ref 3.5–5.1)
Sodium: 137 mmol/L (ref 135–145)

## 2014-05-14 LAB — CBC
HCT: 30.4 % — ABNORMAL LOW (ref 39.0–52.0)
HEMOGLOBIN: 9.7 g/dL — AB (ref 13.0–17.0)
MCH: 27.4 pg (ref 26.0–34.0)
MCHC: 31.9 g/dL (ref 30.0–36.0)
MCV: 85.9 fL (ref 78.0–100.0)
Platelets: 150 10*3/uL (ref 150–400)
RBC: 3.54 MIL/uL — AB (ref 4.22–5.81)
RDW: 18.2 % — ABNORMAL HIGH (ref 11.5–15.5)
WBC: 14.4 10*3/uL — ABNORMAL HIGH (ref 4.0–10.5)

## 2014-05-14 LAB — URINALYSIS, ROUTINE W REFLEX MICROSCOPIC
Bilirubin Urine: NEGATIVE
Glucose, UA: NEGATIVE mg/dL
HGB URINE DIPSTICK: NEGATIVE
Ketones, ur: NEGATIVE mg/dL
Leukocytes, UA: NEGATIVE
NITRITE: NEGATIVE
PH: 5.5 (ref 5.0–8.0)
Protein, ur: NEGATIVE mg/dL
SPECIFIC GRAVITY, URINE: 1.013 (ref 1.005–1.030)
UROBILINOGEN UA: 0.2 mg/dL (ref 0.0–1.0)

## 2014-05-14 LAB — MAGNESIUM: Magnesium: 1.8 mg/dL (ref 1.5–2.5)

## 2014-05-14 MED ORDER — SACCHAROMYCES BOULARDII 250 MG PO CAPS
250.0000 mg | ORAL_CAPSULE | Freq: Two times a day (BID) | ORAL | Status: DC
  Administered 2014-05-14 – 2014-05-18 (×9): 250 mg via ORAL
  Filled 2014-05-14 (×10): qty 1

## 2014-05-14 MED ORDER — POTASSIUM CHLORIDE CRYS ER 20 MEQ PO TBCR
40.0000 meq | EXTENDED_RELEASE_TABLET | ORAL | Status: AC
  Administered 2014-05-14 (×2): 40 meq via ORAL
  Filled 2014-05-14 (×3): qty 2

## 2014-05-14 MED ORDER — MAGNESIUM SULFATE 2 GM/50ML IV SOLN
2.0000 g | Freq: Once | INTRAVENOUS | Status: AC
  Administered 2014-05-14: 2 g via INTRAVENOUS
  Filled 2014-05-14: qty 50

## 2014-05-14 NOTE — Progress Notes (Signed)
11 Days Post-Op  Subjective: He looks tired and still swollen up.  He says he cannot eat very much it just fills him up.  Has a supplement at bedside now and says he does OK with those. He is asking about going home.    Objective: Vital signs in last 24 hours: Temp:  [97.6 F (36.4 C)-98 F (36.7 C)] 97.6 F (36.4 C) (01/04 0456) Pulse Rate:  [92-95] 92 (01/04 0456) Resp:  [20] 20 (01/04 0456) BP: (105-144)/(58-72) 144/58 mmHg (01/04 0456) SpO2:  [94 %-98 %] 94 % (01/04 0456) Weight:  [93.169 kg (205 lb 6.4 oz)] 93.169 kg (205 lb 6.4 oz) (01/04 0456) Last BM Date: 05/14/14 240 PO recorded yesterday, 480 for today so far recorded. 150 from colostomy recorded yesterday Afebrile, VSS K+ 3.3 WBC 14.4, down some but not much Diet:  soft Intake/Output from previous day: 01/03 0701 - 01/04 0700 In: 240 [P.O.:240] Out: 2950 [Urine:2800; Stool:150] Intake/Output this shift: Total I/O In: 480 [P.O.:480] Out: 1125 [Urine:1125]  General appearance: alert, cooperative, no distress and looks tired and fragile. Resp: clear to auscultation bilaterally and anterior exam. GI: soft wound looks good, allot of air in th ostomy, but not much stool    Lab Results:   Recent Labs  05/13/14 0608 05/14/14 0545  WBC 15.0* 14.4*  HGB 10.5* 9.7*  HCT 32.7* 30.4*  PLT 167 150    BMET  Recent Labs  05/13/14 0608 05/14/14 0545  NA 141 137  K 3.0* 3.3*  CL 114* 110  CO2 21 22  GLUCOSE 121* 131*  BUN 12 13  CREATININE 0.86 0.87  CALCIUM 7.5* 7.3*   PT/INR No results for input(s): LABPROT, INR in the last 72 hours.  No results for input(s): AST, ALT, ALKPHOS, BILITOT, PROT, ALBUMIN in the last 168 hours.   Lipase     Component Value Date/Time   LIPASE <10* 05/03/2014 0130     Studies/Results: No results found.  Medications: . sodium chloride   Intravenous Once  . sodium chloride   Intravenous Once  . sodium chloride  10 mL/hr Intravenous Once  . antiseptic oral rinse  7 mL  Mouth Rinse BID  . enoxaparin (LOVENOX) injection  40 mg Subcutaneous Q24H  . furosemide  60 mg Intravenous BID  . levofloxacin  750 mg Oral Q24H  . metoprolol tartrate  75 mg Oral BID  . metroNIDAZOLE  500 mg Oral 3 times per day  . pantoprazole  40 mg Oral Daily  . sodium chloride  10 mL Intravenous Q12H    Assessment/Plan 1. 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 Has been on active chemotx supervised by Dr. Wanda Plump. [name]. 2. . SIGMOID AND LEFT COLON COLECTOMY, END COLOSTOMY, MOBILIZATION OF SPLENIC FLEXURE - 05/03/2014 - D. Newman For perforated sigmoid colon at site of colon cancer -  3. History of CAD/chronic fluid overload  4. Leukopenia - Improving WBC - 9,600 - 05/07/2014 5. Malnutrition 6. HTN 7. Anemia - Hgb - 11.1 - post transfusion 8.  Volume overload 9.  Hypokalemia being treated    Plan:  I think from our standpoint he can transition to the next step. Not sure where the WBC is coming from,  I would like to see him eating more, and his fluid overload status better.  We talked about this and he is going home with Girlfriend.  She is  63, and I ask how she was going to be able to handle him at home.  He says she can do anything.  Day 13 of Flagyl.  9 days of Cefepime, converted to levofloxacin.4 days ago.  If it is not better soon I would repeat CT.  Add probiotics, and check for c diff.    LOS: 12 days    Frank [name] 05/14/2014

## 2014-05-14 NOTE — Progress Notes (Signed)
PT Cancellation Note  Patient Details Name: Frank [name] MRN: [id number] DOB: May 17, 1945   Cancelled Treatment:    Reason Eval/Treat Not Completed: Patient declined,stating he had just finished walking with nursing.  Nursing reports he walked well. Will check on patient tomorrow.     Antoria Lanza LUBECK 05/14/2014, 2:53 PM

## 2014-05-14 NOTE — Progress Notes (Signed)
[facility] PROGRESS NOTE  Frank [name] EAV:[id number] DOB: 01-17-1946 DOA: 05/02/2014 PCP: [name], MD  Brief history 69 y.o. male with Past medical history of metastatic colon cancer to liver, hypertension, coronary artery disease status post CABG, diastolic dysfunction presented with abdominal pain, nausea, and vomiting with generalized weakness. CT of the abdomen and pelvis on the day of admission revealed sigmoid colonic perforation with diffuse Lahey rim enhancing large volume ascites. General surgery was consulted, and the patient was taken to the operating room where he had a hemicolectomy and end colostomy on 05/03/2014. The patient's diet was gradually advanced as his bowel function returned. Palliative medicine was consulted and the patient was changed to DO NOT RESUSCITATE. Although there has been discussion regarding stopping any further chemotherapy and going home with hospice, the patient has not yet made a definitive decision. Dr. [name] continues to follow.   Assessment/Plan: #1 abdominal pain nausea vomiting secondary to perforated sigmoid colon carcinoma with multiple interloop abscesses and advanced metastatic disease to the liver Patient is status post abdominal exploration with distal left colon and sigmoid colon resection and and left colon colostomy. Some clinical improvement. Patient is currently afebrile. WBC trending back up. Patient tolerating a soft diet. Continue current oral antibiotics. Normal saline lock IV fluids. If further rise in WBC may need CT of the abdomen and pelvis to rule out abscess formation per general surgery. General surgery following and appreciate input and recommendations.  #2 volume overload/scrotal edema Likely secondary to aggressive fluid hydration. Patient with 2-3+ bilateral lower extremity edema and scrotal edema. Scrotal ultrasound with marketed scrotal wall edema without abscess. No acute intrascrotal finding. NSL IV fluids.  Continue Lasix 60 mg IV every 12 hours. Follow.  #3 left upper extremity edema Doppler ultrasound negative for DVT. Continue IV Lasix.  #4 pancytopenia Secondary to chemotherapy. Counts improving. Patient has been seen by oncology. Patient lost almost 1 L of blood during surgery and received 2 units of packed red blood cells. Due to patient's history of coronary artery disease goal is to keep hemoglobin greater than 8. Patient noted to have a leukocytosis although on antibiotics. White count now at 15,000. White count continues to increase may need CT of the abdomen and pelvis to rule out abscess per general surgery. Follow.  #5 tachycardia Was thought initially to be secondary to pain blood loss and dehydration. Patient in normal sinus rhythm. Continue current regimen of  beta blocker. Normal saline lock IV fluids. Follow.  #6 hypertension Improved. Continue current regimen of Lopressor.  #7 chronic diastolic heart failure/volume overload Patient looks volume overloaded on examination. Patient's current weight is 94.48kg from 96.8 kg. Patient's weight on admission was approximately 78.6 kg. NSL IV fluids. I/O = -2.710L/24h.  Continue Lasix 60 mg IV every 12 hours. Strict I's and O's. Daily weights. Continue beta blocker. Follow.  #8 history of coronary artery disease status post CABG Patient with some shortness of breath on ambulation however denies any chest pain. Continue beta blocker. Continue IV Lasix to 60 mg every 12 hours, secondary to volume overload.  #9 metastatic colon cancer Outpatient follow-up with oncology.  #10 hypokalemia/hypomagnesemia Likely secondary to diureses. Replete.  #11 goals of care Patient has been seen by palliative care. Patient is currently DO NOT RESUSCITATE. Patient is still considering options in terms of further treatment for his colon cancer. Patient's plan for now is to go home with home health therapies.  #12 prophylaxis PPI for GI prophylaxis.  Lovenox for DVT  prophylaxis.  Code Status: DO NOT RESUSCITATE Family Communication: Updated patient, no family at bedside. Disposition Plan: Home when medically stable with home health therapies   Consultants:  Palliative care: Dr. [name] 05/03/2014  Oncology: Dr. should that 05/03/2014   Gen. surgery: Dr. Hassell Done 05/03/2014  Procedures:  CT abdomen and pelvis 05/02/2014  CT head 05/02/2014  Chest x-ray 05/03/2014  Scrotal ultrasound 05/10/2014  Abdominal exploration with distal left colon and sigmoid colon resection and and left colon colostomy 05/03/2014 per Dr. Lucia Gaskins  2 units packed red blood cells  Left upper extremity Doppler ultrasound 05/09/2014  Antibiotics:  Oral Levaquin 05/10/2014>>> 05/14/2014  Oral Flagyl 05/10/2014>>>>> 05/14/2014  IV cefepime 05/03/2014>>>>> 05/10/2014  IV ciprofloxacin 05/03/2014>>> 05/03/2014  IV Flagyl 05/03/2014>>>>> 05/10/2014    HPI/Subjective: Patient states shortness of breath improving. Patient denies any abdominal pain however states has some abdominal discomfort. No nausea, no emesis. Patient tolerating current diet.  Objective: Filed Vitals:   05/14/14 1346  BP: 146/86  Pulse: 74  Temp:   Resp: 20    Intake/Output Summary (Last 24 hours) at 05/14/14 1413 Last data filed at 05/14/14 1405  Gross per 24 hour  Intake    600 ml  Output   3675 ml  Net  -3075 ml   Filed Weights   05/12/14 0542 05/13/14 0616 05/14/14 0456  Weight: 97.07 kg (214 lb) 94.484 kg (208 lb 4.8 oz) 93.169 kg (205 lb 6.4 oz)    Exam:   General: NAD   Cardiovascular: RRR  Respiratory: CTAB. No rhonchi.  Abdomen: Soft, colostomy with some stool in the pouch, positive bowel sounds, nontender to palpation.  Musculoskeletal: No clubbing or cyanosis. 2-3+ bilateral lower extremity edema improving.  Data Reviewed: Basic Metabolic Panel:  Recent Labs Lab 05/10/14 0447 05/11/14 0530 05/12/14 0500 05/13/14 0608 05/14/14 0545   NA 137 138 140 141 137  K 3.7 3.6 3.5 3.0* 3.3*  CL 115* 117* 115* 114* 110  CO2 16* 16* 19 21 22   GLUCOSE 107* 106* 125* 121* 131*  BUN 14 13 13 12 13   CREATININE 0.79 0.64 0.79 0.86 0.87  CALCIUM 7.2* 7.4* 7.4* 7.5* 7.3*  MG  --   --   --  1.3* 1.8   Liver Function Tests: No results for input(s): AST, ALT, ALKPHOS, BILITOT, PROT, ALBUMIN in the last 168 hours. No results for input(s): LIPASE, AMYLASE in the last 168 hours. No results for input(s): AMMONIA in the last 168 hours. CBC:  Recent Labs Lab 05/10/14 0447 05/11/14 0530 05/12/14 0500 05/13/14 0608 05/14/14 0545  WBC 11.4* 11.7* 13.9* 15.0* 14.4*  HGB 9.6* 10.0* 9.7* 10.5* 9.7*  HCT 29.6* 31.5* 30.5* 32.7* 30.4*  MCV 85.3 86.5 85.9 85.6 85.9  PLT 185 196 176 167 150   Cardiac Enzymes: No results for input(s): CKTOTAL, CKMB, CKMBINDEX, TROPONINI in the last 168 hours. BNP (last 3 results) No results for input(s): PROBNP in the last 8760 hours. CBG: No results for input(s): GLUCAP in the last 168 hours.  No results found for this or any previous visit (from the past 240 hour(s)).   Studies: No results found.  Scheduled Meds: . sodium chloride   Intravenous Once  . sodium chloride   Intravenous Once  . sodium chloride  10 mL/hr Intravenous Once  . antiseptic oral rinse  7 mL Mouth Rinse BID  . enoxaparin (LOVENOX) injection  40 mg Subcutaneous Q24H  . furosemide  60 mg Intravenous BID  . levofloxacin  750 mg Oral Q24H  .  metoprolol tartrate  75 mg Oral BID  . metroNIDAZOLE  500 mg Oral 3 times per day  . pantoprazole  40 mg Oral Daily  . saccharomyces boulardii  250 mg Oral BID  . sodium chloride  10 mL Intravenous Q12H   Continuous Infusions:    Principal Problem:   Perforation of colon cancer s/p colectomy/colostomy 05/03/2014 Active Problems:   Liver masses   Acute blood loss anemia   Malignant neoplasm of colon   Abdominal pain   Dehydration   Mucositis due to antineoplastic therapy    Intractable nausea and vomiting   Acute encephalopathy   Perforated bowel   Palliative care encounter   Essential hypertension   Colon cancer   AP (abdominal pain)   Volume overload   Scrotal edema   Hypokalemia    Time spent: 35 mins    Aura Bibby MD [facility] Pager [phone]. If 7PM-7AM, please contact night-coverage at [email or web address], password [facility] 05/14/2014, 2:13 PM  LOS: 12 days

## 2014-05-15 ENCOUNTER — Encounter: Payer: Self-pay | Admitting: Cardiology

## 2014-05-15 ENCOUNTER — Ambulatory Visit (HOSPITAL_COMMUNITY): Payer: Medicare Other

## 2014-05-15 DIAGNOSIS — E8779 Other fluid overload: Secondary | ICD-10-CM

## 2014-05-15 LAB — CBC
HEMATOCRIT: 28.7 % — AB (ref 39.0–52.0)
HEMOGLOBIN: 9.4 g/dL — AB (ref 13.0–17.0)
MCH: 28.4 pg (ref 26.0–34.0)
MCHC: 32.8 g/dL (ref 30.0–36.0)
MCV: 86.7 fL (ref 78.0–100.0)
Platelets: 147 10*3/uL — ABNORMAL LOW (ref 150–400)
RBC: 3.31 MIL/uL — AB (ref 4.22–5.81)
RDW: 18.3 % — AB (ref 11.5–15.5)
WBC: 15.5 10*3/uL — AB (ref 4.0–10.5)

## 2014-05-15 LAB — BASIC METABOLIC PANEL
Anion gap: 6 (ref 5–15)
BUN: 13 mg/dL (ref 6–23)
CALCIUM: 7.3 mg/dL — AB (ref 8.4–10.5)
CO2: 23 mmol/L (ref 19–32)
Chloride: 109 mEq/L (ref 96–112)
Creatinine, Ser: 0.86 mg/dL (ref 0.50–1.35)
GFR, EST NON AFRICAN AMERICAN: 86 mL/min — AB (ref >90)
GLUCOSE: 140 mg/dL — AB (ref 70–99)
Potassium: 3.2 mmol/L — ABNORMAL LOW (ref 3.5–5.1)
SODIUM: 138 mmol/L (ref 135–145)

## 2014-05-15 LAB — CLOSTRIDIUM DIFFICILE BY PCR: Toxigenic C. Difficile by PCR: NEGATIVE

## 2014-05-15 MED ORDER — SILVER NITRATE-POT NITRATE 75-25 % EX MISC
6.0000 | CUTANEOUS | Status: AC
  Administered 2014-05-15: 6 via TOPICAL
  Filled 2014-05-15: qty 6

## 2014-05-15 MED ORDER — IOHEXOL 300 MG/ML  SOLN
25.0000 mL | INTRAMUSCULAR | Status: AC
  Administered 2014-05-15 (×2): 25 mL via ORAL

## 2014-05-15 MED ORDER — FUROSEMIDE 40 MG PO TABS
40.0000 mg | ORAL_TABLET | Freq: Two times a day (BID) | ORAL | Status: DC
  Administered 2014-05-16: 40 mg via ORAL
  Filled 2014-05-15: qty 1

## 2014-05-15 MED ORDER — IOHEXOL 300 MG/ML  SOLN
100.0000 mL | Freq: Once | INTRAMUSCULAR | Status: AC | PRN
  Administered 2014-05-15: 100 mL via INTRAVENOUS

## 2014-05-15 MED ORDER — POTASSIUM CHLORIDE CRYS ER 20 MEQ PO TBCR
40.0000 meq | EXTENDED_RELEASE_TABLET | ORAL | Status: AC
  Administered 2014-05-15 (×2): 40 meq via ORAL
  Filled 2014-05-15 (×2): qty 2

## 2014-05-15 NOTE — Progress Notes (Signed)
Abdominal wound oozing blood, pressure applied and new outer dressing. Oozing stopped and MD notified.

## 2014-05-15 NOTE — Progress Notes (Signed)
Pt called out stating he was bleeding from abdominal wound. Upon arrival, patient was bleeding profusely and had soaked through his gauze, 2 abd dressings, and abdominal binder. Pressure was applied to the wound, but patient soaked through 5 abd pad. Surgeon was called and he came to bedside and applied pressure to bleeder and ordered silver nitrate. Dressing was replaced, patient was cleaned, and abdominal binder was replaced by a clean one. Pt was given pillow to splint with, taught to decrease abdominal pressure, and head of bed was kept low. BP meds were held due to HR and BP. Will continue to monitor.

## 2014-05-15 NOTE — Progress Notes (Signed)
[facility] PROGRESS NOTE  Frank [name] ZOX:[id number] DOB: 26-Jun-1945 DOA: 05/02/2014 PCP: [name], MD  Brief history 69 y.o. male with Past medical history of metastatic colon cancer to liver, hypertension, coronary artery disease status post CABG, diastolic dysfunction presented with abdominal pain, nausea, and vomiting with generalized weakness. CT of the abdomen and pelvis on the day of admission revealed sigmoid colonic perforation with diffuse Lahey rim enhancing large volume ascites. General surgery was consulted, and the patient was taken to the operating room where he had a hemicolectomy and end colostomy on 05/03/2014. The patient's diet was gradually advanced as his bowel function returned. Palliative medicine was consulted and the patient was changed to DO NOT RESUSCITATE. Although there has been discussion regarding stopping any further chemotherapy and going home with hospice, the patient has not yet made a definitive decision. Dr. [name] continues to follow.   Assessment/Plan: #1 abdominal pain nausea vomiting secondary to perforated sigmoid colon carcinoma with multiple interloop abscesses and advanced metastatic disease to the liver Patient is status post abdominal exploration with distal left colon and sigmoid colon resection and and left colon colostomy. Some clinical improvement. Patient is currently afebrile. WBC trending back up. Patient tolerating a soft diet. Continue current oral antibiotics. Normal saline lock IV fluids. WBC trending up patient with dressing change with blood. CT of the abdomen and pelvis ordered to rule out abscess formation/intraabdominal process, per general surgery. General surgery following and appreciate input and recommendations.  #2 volume overload/scrotal edema Likely secondary to aggressive fluid hydration. Patient with 2-3+ bilateral lower extremity edema and scrotal edema. Scrotal ultrasound with marketed scrotal wall edema  without abscess. No acute intrascrotal finding. NSL IV fluids. Change IV Lasix to oral lasix. Follow.  #3 left upper extremity edema Doppler ultrasound negative for DVT. Continue Lasix.  #4 pancytopenia Secondary to chemotherapy. Counts improving. Patient has been seen by oncology. Patient lost almost 1 L of blood during surgery and received 2 units of packed red blood cells. Due to patient's history of coronary artery disease goal is to keep hemoglobin greater than 8. Patient noted to have a leukocytosis although on antibiotics. White count now at 15.5. White count continues to increase,  CT of the abdomen and pelvis to rule out abscess per general surgery has been ordered. Follow.  #5 tachycardia Was thought initially to be secondary to pain blood loss and dehydration. Patient in normal sinus rhythm. Continue current regimen of  beta blocker. Normal saline lock IV fluids. Follow.  #6 hypertension Improved. Continue current regimen of Lopressor.  #7 chronic diastolic heart failure/volume overload Patient looks volume overloaded on examination. Patient's current weight is 94.48kg from 96.8 kg. Patient's weight on admission was approximately 78.6 kg. NSL IV fluids. I/O = -2.710L/24h.  Change IV Lasix to home dose oral lasix. Strict I's and O's. Daily weights. Continue beta blocker. Follow.  #8 history of coronary artery disease status post CABG Patient with some shortness of breath on ambulation however denies any chest pain. Continue beta blocker. Change IV Lasix to home dose oral lasix,  secondary to volume overload.  #9 metastatic colon cancer Outpatient follow-up with oncology.  #10 hypokalemia/hypomagnesemia Likely secondary to diureses. Replete.  #11 goals of care Patient has been seen by palliative care. Patient is currently DO NOT RESUSCITATE. Patient is still considering options in terms of further treatment for his colon cancer. Patient's plan for now is to go home with home  health therapies.  #12 prophylaxis PPI for GI prophylaxis.  Lovenox for DVT prophylaxis.  Code Status: DO NOT RESUSCITATE Family Communication: Updated patient, no family at bedside. Disposition Plan: Home when medically stable with home health therapies   Consultants:  Palliative care: Dr. [name] 05/03/2014  Oncology: Dr. should that 05/03/2014   Gen. surgery: Dr. Hassell Done 05/03/2014  Procedures:  CT abdomen and pelvis 05/02/2014  CT head 05/02/2014  Chest x-ray 05/03/2014  Scrotal ultrasound 05/10/2014  Abdominal exploration with distal left colon and sigmoid colon resection and and left colon colostomy 05/03/2014 per Dr. Lucia Gaskins  2 units packed red blood cells  Left upper extremity Doppler ultrasound 05/09/2014  Antibiotics:  Oral Levaquin 05/10/2014>>> 05/14/2014  Oral Flagyl 05/10/2014>>>>> 05/14/2014  IV cefepime 05/03/2014>>>>> 05/10/2014  IV ciprofloxacin 05/03/2014>>> 05/03/2014  IV Flagyl 05/03/2014>>>>> 05/10/2014    HPI/Subjective: Patient states shortness of breath improving. Patient with some c/o abdominal pain. No nausea, no emesis. Patient tolerating current diet.  Objective: Filed Vitals:   05/15/14 1355  BP: 142/72  Pulse: 75  Temp: 97.8 F (36.6 C)  Resp: 20    Intake/Output Summary (Last 24 hours) at 05/15/14 1528 Last data filed at 05/15/14 0600  Gross per 24 hour  Intake      0 ml  Output   1200 ml  Net  -1200 ml   Filed Weights   05/12/14 0542 05/13/14 0616 05/14/14 0456  Weight: 97.07 kg (214 lb) 94.484 kg (208 lb 4.8 oz) 93.169 kg (205 lb 6.4 oz)    Exam:   General: NAD   Cardiovascular: RRR  Respiratory: CTAB. No rhonchi.  Abdomen: Soft, colostomy with some stool in the pouch, positive bowel sounds, nontender to palpation. Dressing with blood noted and saturated.  Musculoskeletal: No clubbing or cyanosis. 2-3+ bilateral lower extremity edema improving.  Data Reviewed: Basic Metabolic Panel:  Recent  Labs Lab 05/11/14 0530 05/12/14 0500 05/13/14 0608 05/14/14 0545 05/15/14 0500  NA 138 140 141 137 138  K 3.6 3.5 3.0* 3.3* 3.2*  CL 117* 115* 114* 110 109  CO2 16* 19 21 22 23   GLUCOSE 106* 125* 121* 131* 140*  BUN 13 13 12 13 13   CREATININE 0.64 0.79 0.86 0.87 0.86  CALCIUM 7.4* 7.4* 7.5* 7.3* 7.3*  MG  --   --  1.3* 1.8  --    Liver Function Tests: No results for input(s): AST, ALT, ALKPHOS, BILITOT, PROT, ALBUMIN in the last 168 hours. No results for input(s): LIPASE, AMYLASE in the last 168 hours. No results for input(s): AMMONIA in the last 168 hours. CBC:  Recent Labs Lab 05/11/14 0530 05/12/14 0500 05/13/14 0608 05/14/14 0545 05/15/14 0500  WBC 11.7* 13.9* 15.0* 14.4* 15.5*  HGB 10.0* 9.7* 10.5* 9.7* 9.4*  HCT 31.5* 30.5* 32.7* 30.4* 28.7*  MCV 86.5 85.9 85.6 85.9 86.7  PLT 196 176 167 150 147*   Cardiac Enzymes: No results for input(s): CKTOTAL, CKMB, CKMBINDEX, TROPONINI in the last 168 hours. BNP (last 3 results) No results for input(s): PROBNP in the last 8760 hours. CBG: No results for input(s): GLUCAP in the last 168 hours.  Recent Results (from the past 240 hour(s))  Clostridium Difficile by PCR     Status: None   Collection Time: 05/15/14  7:35 AM  Result Value Ref Range Status   C difficile by pcr NEGATIVE NEGATIVE Final    Comment: Performed at [facility]     Studies: No results found.  Scheduled Meds: . sodium chloride   Intravenous Once  . sodium chloride   Intravenous  Once  . antiseptic oral rinse  7 mL Mouth Rinse BID  . enoxaparin (LOVENOX) injection  40 mg Subcutaneous Q24H  . furosemide  60 mg Intravenous BID  . metoprolol tartrate  75 mg Oral BID  . pantoprazole  40 mg Oral Daily  . potassium chloride  40 mEq Oral Q4H  . saccharomyces boulardii  250 mg Oral BID  . sodium chloride  10 mL Intravenous Q12H   Continuous Infusions:    Principal Problem:   Perforation of colon cancer s/p colectomy/colostomy  05/03/2014 Active Problems:   Liver masses   Acute blood loss anemia   Malignant neoplasm of colon   Abdominal pain   Dehydration   Mucositis due to antineoplastic therapy   Intractable nausea and vomiting   Acute encephalopathy   Perforated bowel   Palliative care encounter   Essential hypertension   Colon cancer   AP (abdominal pain)   Volume overload   Scrotal edema   Hypokalemia    Time spent: 35 mins    Chanie Soucek MD [facility] Pager [phone]. If 7PM-7AM, please contact night-coverage at [email or web address], password [facility] 05/15/2014, 3:28 PM  LOS: 13 days

## 2014-05-15 NOTE — Progress Notes (Signed)
12 Days Post-Op  Subjective: He looks frail, his abdominal wound is more tender than I would expect.  It bleeds easily.  There is some purulent drainage at the base and it is deeper than any other portion of the open wound.    Objective: Vital signs in last 24 hours: Temp:  [97.8 F (36.6 C)-98 F (36.7 C)] 97.8 F (36.6 C) (01/05 0600) Pulse Rate:  [41-85] 41 (01/05 0600) Resp:  [20] 20 (01/05 0600) BP: (112-146)/(71-86) 112/74 mmHg (01/05 0600) SpO2:  [94 %-98 %] 94 % (01/05 0600) Last BM Date: 05/14/14 480 PO 5 listed in I/O for colostomy - I don't know what that means Afebrile,  K+ 3.2,  WBC is 15.5 Intake/Output from previous day: 01/04 0701 - 01/05 0700 In: 480 [P.O.:480] Out: 2530 [Urine:2525; Stool:5] Intake/Output this shift:    General appearance: alert, cooperative and no distress GI: soft very tender, wound looks fine as noted above, some BS, Ostomy OK.  there is some soft black colored stool coming from the ostomy.  Lab Results:   Recent Labs  05/14/14 0545 05/15/14 0500  WBC 14.4* 15.5*  HGB 9.7* 9.4*  HCT 30.4* 28.7*  PLT 150 147*    BMET  Recent Labs  05/14/14 0545 05/15/14 0500  NA 137 138  K 3.3* 3.2*  CL 110 109  CO2 22 23  GLUCOSE 131* 140*  BUN 13 13  CREATININE 0.87 0.86  CALCIUM 7.3* 7.3*   PT/INR No results for input(s): LABPROT, INR in the last 72 hours.  No results for input(s): AST, ALT, ALKPHOS, BILITOT, PROT, ALBUMIN in the last 168 hours.   Lipase     Component Value Date/Time   LIPASE <10* 05/03/2014 0130     Studies/Results: No results found.  Medications: . sodium chloride   Intravenous Once  . sodium chloride   Intravenous Once  . antiseptic oral rinse  7 mL Mouth Rinse BID  . enoxaparin (LOVENOX) injection  40 mg Subcutaneous Q24H  . furosemide  60 mg Intravenous BID  . metoprolol tartrate  75 mg Oral BID  . pantoprazole  40 mg Oral Daily  . potassium chloride  40 mEq Oral Q4H  . saccharomyces boulardii   250 mg Oral BID  . sodium chloride  10 mL Intravenous Q12H    Assessment/Plan 1. 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 Has been on active chemotx supervised by Dr. Wanda Plump. [name]. 2. . SIGMOID AND LEFT COLON COLECTOMY, END COLOSTOMY, MOBILIZATION OF SPLENIC FLEXURE - 05/03/2014 - D. Newman For perforated sigmoid colon at site of colon cancer -  3. History of CAD/chronic fluid overload  4. Leukopenia - Improving WBC - 9,600 - 05/07/2014 5. Malnutrition 6. HTN 7. Anemia - Hgb - 11.1 - post transfusion 8. Volume overload 9. Hypokalemia being treated   Plan:  He is just eating clears and fruit for breakfast, he seems stable, just not getting much stronger.  I will give him some additional K+, repeat his CT just to make sure we are not missing something with the elevated WBC.  C diff is pending but looking at stool this AM I doubt it will be Positive    LOS: 13 days    Frank [name] 05/15/2014

## 2014-05-15 NOTE — Consult Note (Signed)
WOC ostomy follow up Stoma type/location: LLQ Colostomy Stomal assessment/size: 1 and 5/8 inches round, budded, moist, red. Os at center Peristomal assessment: intact, clear Treatment options for stomal/peristomal skin: skin barrier ring to accommodate changing size Output thick brown stool with mucous Ostomy pouching: 2pc., 2 and 1/4 inch pouching system with skin barrier ring Education provided: Patient is instructed on the importance of stoma sizing in this first month post op; technique demonstrated.  Patient closes eyes during procedure and states he is "just tired". Quiet support and comfort provided to patient, who becomes tearful.  Supplies in room for 3 pouching system changes. Enrolled patient in [place] Start Discharge program: Yes [place] nursing team will follow, and will remain available to this patient, the nursing and medical team.  Next pouch change due on Friday, January 8. Please reconsult if visit is needed prior to that date. Thanks, Maudie Flakes, MSN, RN, [place], [place], [place] [phone])

## 2014-05-15 NOTE — Progress Notes (Signed)
PT Cancellation Note  Patient Details Name: [name] MRN: [id number] DOB: 19-Sep-1945   Cancelled Treatment:    Reason Eval/Treat Not Completed: Patient not medically ready;Other (comment) (bleeding from abdomen).  Upon entering room, noted that pt had blood on sheet.  Lifted sheet to assess abdomen and noted copious amounts of blood coming from dressing.  Nurse called in immediately to continue to assess, re-dress, etc.  Will continue to check on pt as appropriate.     Denice Bors 05/15/2014, 12:32 PM

## 2014-05-15 NOTE — Progress Notes (Addendum)
Called by nurse because of bleeding from wound.  He actually has bled a fair amount.  But he looks okay and has no symptoms.  BP 142/72 mmHg  Pulse 75  Temp(Src) 97.8 F (36.6 C) (Oral)  Resp 20  Ht 5\' 9"  (1.753 m)  Wt 205 lb 6.4 oz (93.169 kg)  BMI 30.32 kg/m2  SpO2 99%  Wound - on the upper right edge of the wound at the skin level is a small bleeder.  I controlled this with pressure and Ag Nitrate stick. Will resume dressing changes. Instructions left with nurse. CBC already ordered for AM.  Alphonsa Overall, MD, [facility] Surgery Pager: [phone] Office phone:  [phone]

## 2014-05-15 NOTE — Progress Notes (Signed)
Abdominal wound oozing blood again and pressure applied. Outer dressing replaced. Dr. Hassell Done into see. abd binder applied.

## 2014-05-16 DIAGNOSIS — E43 Unspecified severe protein-calorie malnutrition: Secondary | ICD-10-CM | POA: Insufficient documentation

## 2014-05-16 LAB — CBC
HCT: 22 % — ABNORMAL LOW (ref 39.0–52.0)
Hemoglobin: 7.3 g/dL — ABNORMAL LOW (ref 13.0–17.0)
MCH: 28.7 pg (ref 26.0–34.0)
MCHC: 33.2 g/dL (ref 30.0–36.0)
MCV: 86.6 fL (ref 78.0–100.0)
PLATELETS: 136 10*3/uL — AB (ref 150–400)
RBC: 2.54 MIL/uL — ABNORMAL LOW (ref 4.22–5.81)
RDW: 18.5 % — ABNORMAL HIGH (ref 11.5–15.5)
WBC: 18 10*3/uL — AB (ref 4.0–10.5)

## 2014-05-16 LAB — HEPATIC FUNCTION PANEL
ALBUMIN: 1.6 g/dL — AB (ref 3.5–5.2)
ALK PHOS: 1298 U/L — AB (ref 39–117)
ALT: 15 U/L (ref 0–53)
AST: 41 U/L — ABNORMAL HIGH (ref 0–37)
BILIRUBIN DIRECT: 0.6 mg/dL — AB (ref 0.0–0.3)
BILIRUBIN TOTAL: 1.4 mg/dL — AB (ref 0.3–1.2)
Indirect Bilirubin: 0.8 mg/dL (ref 0.3–0.9)
Total Protein: 4.6 g/dL — ABNORMAL LOW (ref 6.0–8.3)

## 2014-05-16 LAB — PREPARE RBC (CROSSMATCH)

## 2014-05-16 LAB — BASIC METABOLIC PANEL
Anion gap: 6 (ref 5–15)
BUN: 16 mg/dL (ref 6–23)
CALCIUM: 7.4 mg/dL — AB (ref 8.4–10.5)
CO2: 25 mmol/L (ref 19–32)
Chloride: 110 mEq/L (ref 96–112)
Creatinine, Ser: 0.87 mg/dL (ref 0.50–1.35)
GFR, EST NON AFRICAN AMERICAN: 85 mL/min — AB (ref >90)
GLUCOSE: 109 mg/dL — AB (ref 70–99)
POTASSIUM: 4.1 mmol/L (ref 3.5–5.1)
Sodium: 141 mmol/L (ref 135–145)

## 2014-05-16 LAB — MAGNESIUM: Magnesium: 1.6 mg/dL (ref 1.5–2.5)

## 2014-05-16 MED ORDER — HEPARIN SOD (PORK) LOCK FLUSH 100 UNIT/ML IV SOLN
500.0000 [IU] | INTRAVENOUS | Status: DC
Start: 2014-05-16 — End: 2014-05-18
  Administered 2014-05-16: 500 [IU]

## 2014-05-16 MED ORDER — FUROSEMIDE 10 MG/ML IJ SOLN
20.0000 mg | Freq: Once | INTRAMUSCULAR | Status: AC
  Administered 2014-05-17: 20 mg via INTRAVENOUS
  Filled 2014-05-16: qty 2

## 2014-05-16 MED ORDER — HEPARIN SOD (PORK) LOCK FLUSH 100 UNIT/ML IV SOLN: 500.0000 [IU] | INTRAVENOUS | Status: DC | PRN

## 2014-05-16 MED ORDER — FUROSEMIDE 10 MG/ML IJ SOLN
40.0000 mg | Freq: Two times a day (BID) | INTRAMUSCULAR | Status: DC
  Administered 2014-05-16 – 2014-05-21 (×10): 40 mg via INTRAVENOUS
  Filled 2014-05-16 (×12): qty 4

## 2014-05-16 MED ORDER — BOOST / RESOURCE BREEZE PO LIQD
1.0000 | Freq: Two times a day (BID) | ORAL | Status: DC
  Administered 2014-05-16 – 2014-05-21 (×9): 1 via ORAL

## 2014-05-16 MED ORDER — DIPHENHYDRAMINE HCL 25 MG PO CAPS: 25.0000 mg | ORAL_CAPSULE | Freq: Once | ORAL | Status: DC

## 2014-05-16 MED ORDER — SODIUM CHLORIDE 0.9 % IV SOLN: Freq: Once | INTRAVENOUS | Status: DC

## 2014-05-16 MED ORDER — ACETAMINOPHEN 325 MG PO TABS
650.0000 mg | ORAL_TABLET | Freq: Once | ORAL | Status: AC
  Administered 2014-05-16: 650 mg via ORAL
  Filled 2014-05-16: qty 2

## 2014-05-16 MED ORDER — MICONAZOLE NITRATE POWD
Freq: Two times a day (BID) | Status: DC
  Administered 2014-05-16 – 2014-05-21 (×10): via TOPICAL

## 2014-05-16 MED ORDER — SODIUM CHLORIDE 0.9 % IV SOLN
Freq: Once | INTRAVENOUS | Status: AC
  Administered 2014-05-16: 16:00:00 via INTRAVENOUS

## 2014-05-16 MED ORDER — FUROSEMIDE 10 MG/ML IJ SOLN
20.0000 mg | Freq: Once | INTRAMUSCULAR | Status: AC
  Administered 2014-05-16: 20 mg via INTRAVENOUS
  Filled 2014-05-16: qty 2

## 2014-05-16 NOTE — Progress Notes (Signed)
13 Days Post-Op  Subjective: He remains tired and not eating.  He had a skin bleeder along open abdominal wound edge, and dropped hemoglobin 2 grams before Dr. Lucia Gaskins saw him and controlled bleed.  They have him at bed rest and I will change that.  Objective: Vital signs in last 24 hours: Temp:  [97.8 F (36.6 C)-98.1 F (36.7 C)] 98 F (36.7 C) (01/06 0552) Pulse Rate:  [53-75] 62 (01/06 0552) Resp:  [18-20] 20 (01/06 0552) BP: (113-142)/(51-72) 131/51 mmHg (01/06 0552) SpO2:  [96 %-99 %] 96 % (01/06 0552) Last BM Date: 05/14/14 Nothing PO recorded 500 from ostomy, and allot of gas present today Afebrile, VSS Labs show Alk Phos up to 1298 Bilirubin up some WBC 18K H/H down to 7.3/22  Down 2 grams from yesterday. CT yesterday:  Status post partial left colectomy with colostomy seen in left lower quadrant. No abscess is noted at this time. Mild pneumoperitoneum is noted in the epigastric region consistent with recent surgery. Moderate bilateral pleural effusions are noted with adjacent subsegmental atelectasis. Stable multiple hepatic metastatic lesions compared to prior exam. Stable enlarged retroperitoneal lymph node. New mild ascites is noted in the abdomen. Mild anasarca is noted. Cholelithiasis is again noted.   Intake/Output from previous day: 01/05 0701 - 01/06 0700 In: -  Out: 1975 [DZHGD:9242; Stool:500] Intake/Output this shift:    General appearance: alert Resp: clear to auscultation bilaterally and anterior exam.   GI: soft, ostomy working, + BS, and flatus.  Open wound is clean and looks fine this AM.  The bleeding site at 11 o'clock looks fine now and no further bleeding..  he has some red skin at the base of his abdomen, and i have ask the nurse to add some powder to treat this.  Lab Results:   Recent Labs  05/15/14 0500 05/16/14 0600  WBC 15.5* 18.0*  HGB 9.4* 7.3*  HCT 28.7* 22.0*  PLT 147* 136*    BMET  Recent Labs  05/15/14 0500 05/16/14 0600   NA 138 141  K 3.2* 4.1  CL 109 110  CO2 23 25  GLUCOSE 140* 109*  BUN 13 16  CREATININE 0.86 0.87  CALCIUM 7.3* 7.4*   PT/INR No results for input(s): LABPROT, INR in the last 72 hours.   Recent Labs Lab 05/16/14 0600  AST 41*  ALT 15  ALKPHOS 1298*  BILITOT 1.4*  PROT 4.6*  ALBUMIN 1.6*     Lipase     Component Value Date/Time   LIPASE <10* 05/03/2014 0130     Studies/Results: Ct Abdomen Pelvis W Contrast  05/15/2014   CLINICAL DATA:  Perforation of colon.  EXAM: CT ABDOMEN AND PELVIS WITH CONTRAST  TECHNIQUE: Multidetector CT imaging of the abdomen and pelvis was performed using the standard protocol following bolus administration of intravenous contrast.  CONTRAST:  141mL OMNIPAQUE IOHEXOL 300 MG/ML  SOLN  COMPARISON:  CT scan of May 02, 2014.  FINDINGS: Severe degenerative disc disease is noted at L5-S1. Moderate bilateral pleural effusions are noted with adjacent subsegmental atelectasis.  Mild anasarca is noted which is increased compared to prior exam. Status post left colon resection with colostomy seen in left lower quadrant. Mild ascites is noted. There is no evidence of bowel obstruction. Cholelithiasis is again noted. Multiple hepatic masses are noted consistent with metastatic disease. These are unchanged compared to prior exam. Adrenal glands and kidneys appear normal. No hydronephrosis or renal obstruction is noted. Atherosclerotic calcifications of abdominal aorta are noted  without aneurysm formation. Urinary bladder is decompressed. Small fat containing left inguinal hernia is noted. No definite abscess is seen at this time. Mild pneumoperitoneum is noted in the epigastric region most consistent with recent surgery. 18 x 11 mm retroperitoneal lymph node is noted on image number 28 which is unchanged compared to prior exam.  IMPRESSION: Status post partial left colectomy with colostomy seen in left lower quadrant. No abscess is noted at this time. Mild  pneumoperitoneum is noted in the epigastric region consistent with recent surgery.  Moderate bilateral pleural effusions are noted with adjacent subsegmental atelectasis.  Stable multiple hepatic metastatic lesions compared to prior exam. Stable enlarged retroperitoneal lymph node.  New mild ascites is noted in the abdomen.  Mild anasarca is noted.  Cholelithiasis is again noted.   Electronically Signed   By: Sabino Dick M.D.   On: 05/15/2014 16:23    Medications: . sodium chloride   Intravenous Once  . sodium chloride   Intravenous Once  . sodium chloride   Intravenous Once  . antiseptic oral rinse  7 mL Mouth Rinse BID  . enoxaparin (LOVENOX) injection  40 mg Subcutaneous Q24H  . furosemide  40 mg Oral BID  . metoprolol tartrate  75 mg Oral BID  . pantoprazole  40 mg Oral Daily  . saccharomyces boulardii  250 mg Oral BID  . sodium chloride  10 mL Intravenous Q12H    Assessment/Plan 1. 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 Has been on active chemotx supervised by Dr. Wanda Plump. [name]. 2. . SIGMOID AND LEFT COLON COLECTOMY, END COLOSTOMY, MOBILIZATION OF SPLENIC FLEXURE - 05/03/2014 - D. Newman For perforated sigmoid colon at site of colon cancer -  3. History of CAD/chronic fluid overload  4. leukocytosis of uncertain etiology 5. Malnutrition 6. HTN 7. Anemia - Hgb - 11.1 - post transfusion 8. Volume overload 9. Hypokalemia being treated     Plan:  Transfuse 1 unit, but he may need 2 units, regular diet, Mobilize, I don't see a surgical reason for the elevated WBC, or high alk phos.   Will discuss with Medicine and Dr. Hassell Done.  LOS: 14 days    Lashonne Shull 05/16/2014

## 2014-05-16 NOTE — Progress Notes (Signed)
INITIAL NUTRITION ASSESSMENT  DOCUMENTATION CODES Per approved criteria  -Severe malnutrition in the context of chronic illness -Obesity Unspecified  Pt meets criteria for severe MALNUTRITION in the context of chronic illness as evidenced by PO intake < 75% for > one month, 15% body weight loss in < 3 months, moderate muscle wasting and fat loss in temple, upper arm, and clavicle regions   INTERVENTION: -Recommend Resource Breeze po BID, each supplement provides 250 kcal and 9 grams of protein -Recommend Boost Plus PRN as tolerated/requested by pt -Encouraged intake of small frequent meals with high protein/kcal foods -Reviewed nutrition therapy for taste changes/alteration -RD to continue to monitor   NUTRITION DIAGNOSIS: Inadequate oral intake related to decreased appetite/taste changes/volume overload  as evidenced by PO intake < 75%   Goal: Pt to meet >/= 90% of their estimated nutrition needs    Monitor:  Total protein/energy intake, labs, weights, GI profile, supplement tolerance  Reason for Assessment: Consult/MST  69 y.o. male  Admitting Dx: Perforation of colon  ASSESSMENT: Frank [name] is a 69 y.o. male with Past medical history of colon cancer, hypertension, coronary artery disease status post CABG, diastolic dysfunction with preserved ejection fraction. Patient presented with complaints of abdominal pain. History was obtained from patient's significant other. On 04/24/2014 patient had his chemotherapy with FOLFIRI and Avastin, he is also on IV iron. Generally after chemotherapy he has nausea and vomiting lasting for few days as well as diarrhea without any blood.  Pt reported decreased appetite since beginning of chemo treatments, which started early 04/2014 -Endorsed nausea, vomiting, diarrhea and taste changes that inhibit PO intake -Diet recall indicated pt consuming Boost supplements occasionally, but not regularly, and ate small portions of 2-3 meals daily  (15-50% meal completion) -Reported weight loss; but unable to quantify due to fluid changes. Previous medical records indicate a 30 lb weight loss in 1-2 months (15% body weight loss, severe for time frame). Wt currently above usual body wt d/t volume overload -Pt s/p sigmoid colon colectomy and colostomy. Having good output, ~300 ml -Nausea/vomiting/diarrhea currently resolved; however pt continues with decreased intake d/t overall disinterest in foods and residual taste changes -Pt willing to consume [employer] as he believes he will tolerate it better vs Ensure/Boost -Discussed ways to assist in preventing "too sweet" or "too salty" tastes    Height: Ht Readings from Last 1 Encounters:  05/03/14 5\' 9"  (1.753 m)    Weight: Wt Readings from Last 1 Encounters:  05/14/14 205 lb 6.4 oz (93.169 kg)    Ideal Body Weight: 160 lb  % Ideal Body Weight: 128%  Wt Readings from Last 10 Encounters:  05/14/14 205 lb 6.4 oz (93.169 kg)  04/24/14 185 lb 11.2 oz (84.233 kg)  04/10/14 179 lb 14.4 oz (81.602 kg)  03/27/14 186 lb 6.4 oz (84.55 kg)  03/14/14 210 lb (95.255 kg)  03/13/14 213 lb 14.4 oz (97.024 kg)  02/23/14 227 lb 4.7 oz (103.1 kg)  09/13/13 231 lb 6.4 oz (104.962 kg)  02/15/13 252 lb (114.306 kg)    Usual Body Weight: 180-185 lb (pre edema)  % Usual Body Weight: 114%  BMI:  Body mass index is 30.32 kg/(m^2).  Estimated Nutritional Needs: Kcal: 1950-2150 Protein: 100-110 gram Fluid: >/=2300 ml daily  Skin: +3 RLE and LLE edema, +1 generalized edema  Diet Order: Diet regular  EDUCATION NEEDS: -Education needs addressed   Intake/Output Summary (Last 24 hours) at 05/16/14 1122 Last data filed at 05/15/14 2048  Gross  per 24 hour  Intake      0 ml  Output   1375 ml  Net  -1375 ml    Last BM: 1/06; colostomy   Labs:   Recent Labs Lab 05/13/14 0608 05/14/14 0545 05/15/14 0500 05/16/14 0600  NA 141 137 138 141  K 3.0* 3.3* 3.2* 4.1  CL 114* 110 109  110  CO2 21 22 23 25   BUN 12 13 13 16   CREATININE 0.86 0.87 0.86 0.87  CALCIUM 7.5* 7.3* 7.3* 7.4*  MG 1.3* 1.8  --  1.6  GLUCOSE 121* 131* 140* 109*    CBG (last 3)  No results for input(s): GLUCAP in the last 72 hours.  Scheduled Meds: . sodium chloride   Intravenous Once  . sodium chloride   Intravenous Once  . sodium chloride   Intravenous Once  . sodium chloride   Intravenous Once  . acetaminophen  650 mg Oral Once  . antiseptic oral rinse  7 mL Mouth Rinse BID  . diphenhydrAMINE  25 mg Oral Once  . enoxaparin (LOVENOX) injection  40 mg Subcutaneous Q24H  . feeding supplement (RESOURCE BREEZE)  1 Container Oral q12n4p  . furosemide  20 mg Intravenous Once  . furosemide  40 mg Oral BID  . metoprolol tartrate  75 mg Oral BID  . miconazole nitrate   Topical BID  . pantoprazole  40 mg Oral Daily  . saccharomyces boulardii  250 mg Oral BID  . sodium chloride  10 mL Intravenous Q12H    Continuous Infusions:   Past Medical History  Diagnosis Date  . Hyperlipidemia   . Hypertension   . Ischemic heart disease   . Obesity   . Difficult intubation 05/03/2014    anterior airway accessed by Glidescope    Past Surgical History  Procedure Laterality Date  . Coronary artery bypass graft    . Back surgery    . Colon resection N/A 05/03/2014    Procedure: SIGMOID AND LEFT COLON COLECTOMY, END COLOSTOMY, MOBILIZATION OF SPLENIC FLEXURE;  Surgeon: Alphonsa Overall, MD;  Location: [facility];  Service: General;  Laterality: N/A;    Atlee Abide MS RD LDN Clinical Dietitian [phone]

## 2014-05-16 NOTE — Progress Notes (Signed)
Unable to obtain Daily Weight due to increased abdominal pressure from getting up and standing causing abdominal wound to bleed. No beds with weighing ability are available at current time, when one is available, the patient will be transferred to the new bed.

## 2014-05-16 NOTE — Progress Notes (Signed)
[facility] PROGRESS NOTE  Frank [name] RAX:[id number] DOB: 25-Feb-1946 DOA: 05/02/2014 PCP: [name], MD  Brief history 69 y.o. male with Past medical history of metastatic colon cancer to liver, hypertension, coronary artery disease status post CABG, diastolic dysfunction presented with abdominal pain, nausea, and vomiting with generalized weakness. CT of the abdomen and pelvis on the day of admission revealed sigmoid colonic perforation with diffuse Lahey rim enhancing large volume ascites. General surgery was consulted, and the patient was taken to the operating room where he had a hemicolectomy and end colostomy on 05/03/2014. The patient's diet was gradually advanced as his bowel function returned. Palliative medicine was consulted and the patient was changed to DO NOT RESUSCITATE. Although there has been discussion regarding stopping any further chemotherapy and going home with hospice, the patient has not yet made a definitive decision. Dr. [name] continues to follow.   Assessment/Plan: #1 abdominal pain nausea vomiting secondary to perforated sigmoid colon carcinoma with multiple interloop abscesses and advanced metastatic disease to the liver status post abdominal exploration Colectomy + left colon colostomy 12/24 Afebrile but WBC trending back up-stopped Abx after course from 12/23 until 05/14/14. Patient tolerating a soft diet.  Normal saline lock IV fluids.  CT of the abdomen and pelvis  - for abcess 05/15/13  General surgery following and appreciate input and recommendations.  #2 volume overload/scrotal edema Likely secondary to aggressive fluid hydration.  Patient with 2-3+ bilateral lower extremity edema and scrotal edema.  Scrotal ultrasound with marketed scrotal wall edema without abscess. No acute intrascrotal finding. Change IV Lasix to oral lasix.  Elevated Alk Phos 1200's, T bili up  Defer to Oncology NO obvious reson other than potential Metastatic spread as  seen on CT 1/5 Consider [place] abd pelvis dependant on how aggressive Oncology and Patient wish to be?-I have forwarded note to Dr. Rhae Hammock is still hoping for Rx options after recovery from Surgery  #3 left upper extremity edema Doppler ultrasound negative for DVT. Continue Lasix.  #4 Anemia/TCP Secondary to chemotherapy. Counts improving. Patient has been seen by oncology. Patient lost almost 1 L of blood during surgery and received 2 units of packed red blood cells. Trasnfused again 05/16/13  Due to patient's history of coronary artery disease goal is to keep hemoglobin greater than 8. P atient noted to have a leukocytosis although on antibiotics.  White count now at 18 Abx stopped 05/14/14  #5 tachycardia Was thought initially to be secondary to pain blood loss and dehydration. Patient in normal sinus rhythm. Continue current regimen of  beta blocker. Normal saline lock IV fluids. Follow.  #6 hypertension Improved. Continue current regimen of Lopressor.  #7 chronic diastolic heart failure/volume overload Patient looks volume overloaded on examination. Patient's current weight 93kg. Patient's weight on admission was approximately 78.6 kg.  NSL IV fluids. I/O = -7.8.   Change  To IV Lasix 05/16/14.  Gross anasarca Strict I's and O's. Daily weights.  Continue metoprolol 75 bid.  #8 history of coronary artery disease status post CABG Patient with some shortness of breath on ambulation however denies any chest pain. Continue beta blocker.   #9 metastatic colon cancer Outpatient follow-up with oncology.  #10 hypokalemia/hypomagnesemia Likely secondary to diureses. Replete.  #11 goals of care Patient has been seen by palliative care. Patient is currently DO NOT RESUSCITATE. Patient is still considering options in terms of further treatment for his colon cancer. Patient's plan for now is to go home with home health therapies.  #12  prophylaxis PPI for GI prophylaxis. Lovenox for DVT  prophylaxis.  Code Status: DO NOT RESUSCITATE Family Communication: Updated patient, no family at bedside. Disposition Plan: Home when medically stable with home health therapies   Consultants:  Palliative care: Dr. [name] 05/03/2014  Oncology: Dr. should that 05/03/2014   Gen. surgery: Dr. Hassell Done 05/03/2014  Procedures:  CT abdomen and pelvis 05/02/2014  CT head 05/02/2014  Chest x-ray 05/03/2014  Scrotal ultrasound 05/10/2014  Abdominal exploration with distal left colon and sigmoid colon resection and and left colon colostomy 05/03/2014 per Dr. Lucia Gaskins  2 units packed red blood cells  Left upper extremity Doppler ultrasound 05/09/2014  I U PRBC 05/16/14  Antibiotics:  Oral Levaquin 05/10/2014>>> 05/14/2014  Oral Flagyl 05/10/2014>>>>> 05/14/2014  IV cefepime 05/03/2014>>>>> 05/10/2014  IV ciprofloxacin 05/03/2014>>> 05/03/2014  IV Flagyl 05/03/2014>>>>> 05/10/2014    HPI/Subjective:  Alert responsive Looks fair No fruthe rbleed from sotmach wound No pain Tol diet fair only ?stool Famiyl in room No CP  Objective: Filed Vitals:   05/16/14 1535  BP: 106/63  Pulse: 108  Temp: 98.1 F (36.7 C)  Resp: 20    Intake/Output Summary (Last 24 hours) at 05/16/14 1557 Last data filed at 05/16/14 1520  Gross per 24 hour  Intake    255 ml  Output   1300 ml  Net  -1045 ml   Filed Weights   05/12/14 0542 05/13/14 0616 05/14/14 0456  Weight: 97.07 kg (214 lb) 94.484 kg (208 lb 4.8 oz) 93.169 kg (205 lb 6.4 oz)    Exam:   General: NAD   Cardiovascular: RRR  Respiratory: CTAB. No rhonchi.  Abdomen: Wound not examined  Musculoskeletal: No clubbing or cyanosis. 2-3+ bilateral lower extremity edema improving.  Data Reviewed: Basic Metabolic Panel:  Recent Labs Lab 05/12/14 0500 05/13/14 0608 05/14/14 0545 05/15/14 0500 05/16/14 0600  NA 140 141 137 138 141  K 3.5 3.0* 3.3* 3.2* 4.1  CL 115* 114* 110 109 110  CO2 _0 GLUCOSE 125* 121* 131* 140* 109*  BUN _1 CREATININE 0.79 0.86 0.87 0.86 0.87  CALCIUM 7.4* 7.5* 7.3* 7.3* 7.4*  MG  --  1.3* 1.8  --  1.6   Liver Function Tests:  Recent Labs Lab 05/16/14 0600  AST 41*  ALT 15  ALKPHOS 1298*  BILITOT 1.4*  PROT 4.6*  ALBUMIN 1.6*   No results for input(s): LIPASE, AMYLASE in the last 168 hours. No results for input(s): AMMONIA in the last 168 hours. CBC:  Recent Labs Lab 05/12/14 0500 05/13/14 0608 05/14/14 0545 05/15/14 0500 05/16/14 0600  WBC 13.9* 15.0* 14.4* 15.5* 18.0*  HGB 9.7* 10.5* 9.7* 9.4* 7.3*  HCT 30.5* 32.7* 30.4* 28.7* 22.0*  MCV 85.9 85.6 85.9 86.7 86.6  PLT 176 167 150 147* 136*   Cardiac Enzymes: No results for input(s): CKTOTAL, CKMB, CKMBINDEX, TROPONINI in the last 168 hours. BNP (last 3 results) No results for input(s): PROBNP in the last 8760 hours. CBG: No results for input(s): GLUCAP in the last 168 hours.  Recent Results (from the past 240 hour(s))  Clostridium Difficile by PCR     Status: None   Collection Time: 05/15/14  7:35 AM  Result Value Ref Range Status   C difficile by pcr NEGATIVE NEGATIVE Final    Comment: Performed at [facility]     Studies: Ct Abdomen Pelvis W Contrast  05/15/2014   CLINICAL DATA:  Perforation of colon.  EXAM: CT ABDOMEN AND PELVIS WITH CONTRAST  TECHNIQUE: Multidetector CT imaging of the abdomen and pelvis was performed using the standard protocol following bolus administration of intravenous contrast.  CONTRAST:  153m OMNIPAQUE IOHEXOL 300 MG/ML  SOLN  COMPARISON:  CT scan of May 02, 2014.  FINDINGS: Severe degenerative disc disease is noted at L5-S1. Moderate bilateral pleural effusions are noted with adjacent subsegmental atelectasis.  Mild anasarca is noted which is increased compared to prior exam. Status post left colon resection with colostomy seen in left lower quadrant. Mild ascites is noted. There is no evidence of bowel obstruction.  Cholelithiasis is again noted. Multiple hepatic masses are noted consistent with metastatic disease. These are unchanged compared to prior exam. Adrenal glands and kidneys appear normal. No hydronephrosis or renal obstruction is noted. Atherosclerotic calcifications of abdominal aorta are noted without aneurysm formation. Urinary bladder is decompressed. Small fat containing left inguinal hernia is noted. No definite abscess is seen at this time. Mild pneumoperitoneum is noted in the epigastric region most consistent with recent surgery. 18 x 11 mm retroperitoneal lymph node is noted on image number 28 which is unchanged compared to prior exam.  IMPRESSION: Status post partial left colectomy with colostomy seen in left lower quadrant. No abscess is noted at this time. Mild pneumoperitoneum is noted in the epigastric region consistent with recent surgery.  Moderate bilateral pleural effusions are noted with adjacent subsegmental atelectasis.  Stable multiple hepatic metastatic lesions compared to prior exam. Stable enlarged retroperitoneal lymph node.  New mild ascites is noted in the abdomen.  Mild anasarca is noted.  Cholelithiasis is again noted.   Electronically Signed   By: JSabino DickM.D.   On: 05/15/2014 16:23    Scheduled Meds: . sodium chloride   Intravenous Once  . sodium chloride   Intravenous Once  . sodium chloride   Intravenous Once  . antiseptic oral rinse  7 mL Mouth Rinse BID  . diphenhydrAMINE  25 mg Oral Once  . enoxaparin (LOVENOX) injection  40 mg Subcutaneous Q24H  . feeding supplement (RESOURCE BREEZE)  1 Container Oral q12n4p  . furosemide  20 mg Intravenous Once  . furosemide  40 mg Oral BID  . heparin lock flush  500 Units Intracatheter Q30 days  . metoprolol tartrate  75 mg Oral BID  . miconazole nitrate   Topical BID  . pantoprazole  40 mg Oral Daily  . saccharomyces boulardii  250 mg Oral BID  . sodium chloride  10 mL Intravenous Q12H   Continuous Infusions:     Principal Problem:   Perforation of colon cancer s/p colectomy/colostomy 05/03/2014 Active Problems:   Liver masses   Acute blood loss anemia   Malignant neoplasm of colon   Abdominal pain   Dehydration   Mucositis due to antineoplastic therapy   Intractable nausea and vomiting   Acute encephalopathy   Perforated bowel   Palliative care encounter   Essential hypertension   Colon cancer   AP (abdominal pain)   Volume overload   Scrotal edema   Hypokalemia    Time spent: 35 mins  JVerneita Griffes MD [facility] (P) [phone]

## 2014-05-16 NOTE — Progress Notes (Signed)
PT Cancellation Note  Patient Details Name: Frank [name] MRN: [id number] DOB: 20-Feb-1946   Cancelled Treatment:      C/M am HgB 7.3 will wait till after at least one unit of blood.                                               Returned at 1:30pm just now doing type and cross so unit has not yet begun.  Will have to check back tomorrow am.  Rica Koyanagi  PTA [facility]  Acute  Rehab Pager      [phone]

## 2014-05-17 ENCOUNTER — Ambulatory Visit: Payer: Medicare Other | Admitting: Cardiology

## 2014-05-17 DIAGNOSIS — R531 Weakness: Secondary | ICD-10-CM

## 2014-05-17 DIAGNOSIS — D696 Thrombocytopenia, unspecified: Secondary | ICD-10-CM

## 2014-05-17 DIAGNOSIS — R630 Anorexia: Secondary | ICD-10-CM

## 2014-05-17 DIAGNOSIS — R000 Tachycardia, unspecified: Secondary | ICD-10-CM

## 2014-05-17 DIAGNOSIS — C189 Malignant neoplasm of colon, unspecified: Secondary | ICD-10-CM

## 2014-05-17 DIAGNOSIS — K658 Other peritonitis: Secondary | ICD-10-CM

## 2014-05-17 DIAGNOSIS — F4323 Adjustment disorder with mixed anxiety and depressed mood: Secondary | ICD-10-CM

## 2014-05-17 LAB — BASIC METABOLIC PANEL
ANION GAP: 7 (ref 5–15)
BUN: 15 mg/dL (ref 6–23)
CHLORIDE: 106 meq/L (ref 96–112)
CO2: 25 mmol/L (ref 19–32)
CREATININE: 0.76 mg/dL (ref 0.50–1.35)
Calcium: 7.4 mg/dL — ABNORMAL LOW (ref 8.4–10.5)
GFR calc Af Amer: >90 mL/min (ref >90)
GFR calc non Af Amer: >90 mL/min (ref >90)
Glucose, Bld: 138 mg/dL — ABNORMAL HIGH (ref 70–99)
POTASSIUM: 3.1 mmol/L — AB (ref 3.5–5.1)
Sodium: 138 mmol/L (ref 135–145)

## 2014-05-17 LAB — HEPATIC FUNCTION PANEL
ALT: 17 U/L (ref 0–53)
AST: 43 U/L — AB (ref 0–37)
Albumin: 1.9 g/dL — ABNORMAL LOW (ref 3.5–5.2)
Alkaline Phosphatase: 1184 U/L — ABNORMAL HIGH (ref 39–117)
BILIRUBIN DIRECT: 0.8 mg/dL — AB (ref 0.0–0.3)
Indirect Bilirubin: 0.6 mg/dL (ref 0.3–0.9)
Total Bilirubin: 1.4 mg/dL — ABNORMAL HIGH (ref 0.3–1.2)
Total Protein: 4.9 g/dL — ABNORMAL LOW (ref 6.0–8.3)

## 2014-05-17 LAB — LACTATE DEHYDROGENASE: LDH: 152 U/L (ref 94–250)

## 2014-05-17 LAB — SAVE SMEAR

## 2014-05-17 LAB — CBC
HCT: 30 % — ABNORMAL LOW (ref 39.0–52.0)
Hemoglobin: 9.9 g/dL — ABNORMAL LOW (ref 13.0–17.0)
MCH: 28 pg (ref 26.0–34.0)
MCHC: 33 g/dL (ref 30.0–36.0)
MCV: 84.7 fL (ref 78.0–100.0)
Platelets: 80 10*3/uL — ABNORMAL LOW (ref 150–400)
RBC: 3.54 MIL/uL — AB (ref 4.22–5.81)
RDW: 17.2 % — AB (ref 11.5–15.5)
WBC: 18.9 10*3/uL — ABNORMAL HIGH (ref 4.0–10.5)

## 2014-05-17 LAB — PROTIME-INR
INR: 1.7 — ABNORMAL HIGH (ref 0.00–1.49)
Prothrombin Time: 20.2 seconds — ABNORMAL HIGH (ref 11.6–15.2)

## 2014-05-17 LAB — TECHNOLOGIST SMEAR REVIEW

## 2014-05-17 LAB — D-DIMER, QUANTITATIVE: D-Dimer, Quant: 5.31 ug/mL-FEU — ABNORMAL HIGH (ref 0.00–0.48)

## 2014-05-17 MED ORDER — POTASSIUM CHLORIDE CRYS ER 20 MEQ PO TBCR
40.0000 meq | EXTENDED_RELEASE_TABLET | Freq: Once | ORAL | Status: AC
  Administered 2014-05-17: 40 meq via ORAL
  Filled 2014-05-17: qty 2

## 2014-05-17 MED ORDER — MIRTAZAPINE 15 MG PO TABS
15.0000 mg | ORAL_TABLET | Freq: Every day | ORAL | Status: DC
  Filled 2014-05-17 (×2): qty 1

## 2014-05-17 NOTE — Progress Notes (Signed)
IP PROGRESS NOTE  Subjective:   Frank [name] report feeling fair this afternoon. He participated in physical therapy but had to stop prematurely because of weakness and tachycardia. He otherwise reports no new symptoms. He does not report any worsening abdominal pain. He does not report any back pain or any bony discomfort. His appetite have been slow but improving at this time.  Objective:  Vital signs in last 24 hours: Temp:  [97.8 F (36.6 C)-98.4 F (36.9 C)] 98.4 F (36.9 C) (01/07 0553) Pulse Rate:  [47-112] 106 (01/07 1045) Resp:  [18-20] 20 (01/07 0553) BP: (106-137)/(53-82) 118/74 mmHg (01/07 1045) SpO2:  [94 %-99 %] 94 % (01/07 0553) Weight:  [172 lb 1.6 oz (78.064 kg)] 172 lb 1.6 oz (78.064 kg) (01/07 1055) Weight change:  Last BM Date: 05/16/14  Intake/Output from previous day: 01/06 0701 - 01/07 0700 In: 1047 [P.O.:462; I.V.:250; Blood:335] Out: 2720 [Urine:2470; Stool:250]  Well-appearing gentleman did not appear in any distress. Mouth: mucous membranes moist, pharynx normal without lesions Resp: clear to auscultation bilaterally Cardio: regular rate and rhythm, S1, S2 normal, no murmur, click, rub or gallop GI: Soft and slightly tender to touch with good bowel sounds. Extremities: Edema noted.  Portacath without erythema  Lab Results:  Recent Labs  05/16/14 0600 05/17/14 0507  WBC 18.0* 18.9*  HGB 7.3* 9.9*  HCT 22.0* 30.0*  PLT 136* 80*    BMET  Recent Labs  05/16/14 0600 05/17/14 0507  NA 141 138  K 4.1 3.1*  CL 110 106  CO2 25 25  GLUCOSE 109* 138*  BUN 16 15  CREATININE 0.87 0.76  CALCIUM 7.4* 7.4*      Medications: I have reviewed the patient's current medications.  Assessment/Plan:  69 year old gentleman with the following issues:  1. 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 He received 2 cycles of chemotherapy with some decrease in his hepatic metastasis as evident by the CT scan noted on  05/02/2014. Repeat CT scan on January 50,016 did not show any evidence of progressive metastatic disease. I still very skeptical about his ability to receive further chemotherapy although that option has not been completely ruled out. At this time, he is not a candidate for chemotherapy till his recovery is complete. I discussed that with him today extensively and I feel that the the odds are he will not receive any further treatments.  2. Colonic perforation and peritonitis. He is status post sigmoid left colon colectomy and end colostomy done on 05/03/2014 for a perforated sigmoid colon. He tolerated the procedure well and so far without any major complications. he is recovering slowly at this time.   3. thrombocytopenia: His platelet count dropped from 1:30 down to 80. I see no evidence to suggest hemolysis or consumptive coagulopathy. His LDH is normal and his hemoglobin is stable at 9.9. He has no bleeding at this time and recommending continuous monitoring.  4. Elevated alkaline phosphatase: Could be related to his hepatic metastasis and less likely related to bone metastases which have not been documented by his recent CT scan.  5. Prognosis: He understands that he still have an incurable malignancy with an uphill battle for recovery. He understands that require skilled nursing facility stay before he's able to go home.     LOS: 15 days   OEVOJJ,KKXFG 05/17/2014, 12:39 PM

## 2014-05-17 NOTE — Progress Notes (Signed)
Physical Therapy Treatment Patient Details Name: Frank [name] MRN: [id number] DOB: [birth year] Today's Date: 05/17/2014    History of Present Illness Abdominal exploration with distal left colon and sigmoid resection with end left colon colostomy 05/03/14.     PT Comments    Pt was OOB in recliner in middle morning care wash up.  Assited with standing several time for hygiene and placement of ABD corset.  Attempted amb however pt c/o max dizziness and weakness.  Will return later today to attempt ambulation.   Follow Up Recommendations  Home health PT;Supervision/Assistance - 24 hour     Equipment Recommendations  Rolling walker with 5" wheels    Recommendations for Other Services       Precautions / Restrictions Precautions Precautions: Fall Precaution Comments: ABD binder/colostomy Restrictions Weight Bearing Restrictions: No    Mobility  Bed Mobility               General bed mobility comments: pt oob in recliner.  Transfers Overall transfer level: Needs assistance Equipment used: Rolling walker (2 wheeled) Transfers: Sit to/from Stand (performed twice due to fatigue) Sit to Stand: Min assist         General transfer comment: increased time with MAX c/o fatigue  Ambulation/Gait             General Gait Details: unable to attempt due to mac c/o fatigue from getting OOB and bath with NT.  Mod c/o dizziness    Stairs            Wheelchair Mobility    Modified Rankin (Stroke Patients Only)       Balance                                    Cognition                            Exercises      General Comments        Pertinent Vitals/Pain Pain Assessment: 0-10 Pain Score: 2  Pain Location: ABD Pain Descriptors / Indicators: Sore Pain Intervention(s): Monitored during session;Repositioned    Home Living                      Prior Function            PT Goals (current goals can now be  found in the care plan section)      Frequency  Min 3X/week    PT Plan      Co-evaluation             End of Session Equipment Utilized During Treatment: Gait belt Activity Tolerance: Patient limited by fatigue Patient left: in chair;with call bell/phone within reach     Time: 1108-1120 PT Time Calculation (min) (ACUTE ONLY): 12 min  Charges:  $Therapeutic Activity: 8-22 mins                    G Codes:      Rica Koyanagi  PTA [facility]  Acute  Rehab Pager      [phone]

## 2014-05-17 NOTE — Progress Notes (Signed)
Patient KN:[id number] E Clair      DOB: Aug 14, 1945      ALP:[id number]   Palliative Medicine Team at [facility] Progress Note    Subjective: Continues to feel weak since episode of bleeding kept him in bed few days. Appetite poor. Trying to do protein shakes, but difficult to tolerate large amounts.  Pain controlled with PRN percocet.  No nausea. Mood is down.     Filed Vitals:   05/17/14 1439  BP: 101/62  Pulse: 64  Temp:   Resp: 19   Physical exam: GEN: alert, NAD HEENT: [place], sclera anicteric CV: RRR LUNGS: CTAB ABD: soft, ND EXT: warm  CBC    Component Value Date/Time   WBC 18.9* 05/17/2014 0507   WBC 1.1* 05/02/2014 1510   RBC 3.54* 05/17/2014 0507   RBC 3.65* 05/02/2014 1510   HGB 9.9* 05/17/2014 0507   HGB 9.8* 05/02/2014 1510   HCT 30.0* 05/17/2014 0507   HCT 30.7* 05/02/2014 1510   PLT 80* 05/17/2014 0507   PLT 85* 05/02/2014 1510   MCV 84.7 05/17/2014 0507   MCV 84.1 05/02/2014 1510   MCH 28.0 05/17/2014 0507   MCH 26.8* 05/02/2014 1510   MCHC 33.0 05/17/2014 0507   MCHC 31.9* 05/02/2014 1510   RDW 17.2* 05/17/2014 0507   RDW 17.2* 05/02/2014 1510   LYMPHSABS 0.2* 05/04/2014 0510   LYMPHSABS 0.1* 05/02/2014 1510   MONOABS 0.5 05/04/2014 0510   MONOABS 0.2 05/02/2014 1510   EOSABS 0.0 05/04/2014 0510   EOSABS 0.0 05/02/2014 1510   BASOSABS 0.0 05/04/2014 0510   BASOSABS 0.0 05/02/2014 1510    CMP     Component Value Date/Time   NA 138 05/17/2014 0507   NA 136 05/02/2014 1511   K 3.1* 05/17/2014 0507   K 4.0 05/02/2014 1511   CL 106 05/17/2014 0507   CO2 25 05/17/2014 0507   CO2 22 05/02/2014 1511   GLUCOSE 138* 05/17/2014 0507   GLUCOSE 145* 05/02/2014 1511   BUN 15 05/17/2014 0507   BUN 23.7 05/02/2014 1511   CREATININE 0.76 05/17/2014 0507   CREATININE 1.0 05/02/2014 1511   CALCIUM 7.4* 05/17/2014 0507   CALCIUM 8.4 05/02/2014 1511   PROT 4.9* 05/17/2014 0507   PROT 5.6* 05/02/2014 1511   ALBUMIN 1.9* 05/17/2014 0507   ALBUMIN 2.0*  05/02/2014 1511   AST 43* 05/17/2014 0507   AST 9 05/02/2014 1511   ALT 17 05/17/2014 0507   ALT 9 05/02/2014 1511   ALKPHOS 1184* 05/17/2014 0507   ALKPHOS 104 05/02/2014 1511   BILITOT 1.4* 05/17/2014 0507   BILITOT 0.79 05/02/2014 1511   GFRNONAA >90 05/17/2014 0507   GFRAA >90 05/17/2014 0507       Assessment and plan: 69 yo male with PMHx of stage IV Colon CA, CAD who presented with abdominal pain, N/V and found to have perforated sigmoid colon with peritonitis  1. Code Status: DNR  2. Goals of Care: Unfortunately had some setbacks since I saw him last.  Seems actually weaker than last week.  Had some issues with bleeding which kept him in bed for few days.  Last week they were pushing to go home with hospice care, but they are still considering ongoing treatment. This thought may gain have changed based on course over past few days and may again change based on how he does in the coming weeks. Currently family has expressed desire for him to go to rehab, but I am not sure he is  as sold on that.  I will try to follow-up with them tomorrow.     3. Symptom Management:  1. Post-Op Pain: controlled with PRN percocet 2. Loss of Appetite- will trial remeron. Can also consider trial of marinol. Will see how he does in coming days.  Struggling to take in protein. Some dysgeusia may also be playing role and we discussed strategies for managing this 3. ? Depression vs Adjustment Disorder- family concerned about mood. Remeron may have added benefit of addressing this as well    Total Time: 30 minutes >50% of time spent in counseling and coordination of care regarding above.    Doran Clay D.O. Palliative Medicine Team at [facility]  Pager: [phone] Team Phone: [phone]

## 2014-05-17 NOTE — Progress Notes (Signed)
CARE MANAGEMENT NOTE 05/17/2014  Patient:  Frank [name], Frank [name]   Account Number:  [id number]  Date Initiated:  05/15/2014  Documentation initiated by:  Karl Bales  Subjective/Objective Assessment:   pt admitted with cco perforation of colon     Action/Plan:   from home   Anticipated DC Date:  05/18/2014   Anticipated DC Plan:  [place]  CM consult      Choice offered to / List presented to:             [place].   Status of service:  In process, will continue to follow Medicare Important Message given?  YES (If response is "NO", the following Medicare IM given date fields will be blank) Date Medicare IM given:  05/15/2014 Medicare IM given by:  Karl Bales Date Additional Medicare IM given:   Additional Medicare IM given by:    Discharge Disposition:    Per UR Regulation:  Reviewed for med. necessity/level of care/duration of stay  If discussed at [place] of Stay Meetings, dates discussed:    Comments:  05/17/14 MMcGibboney, RN, BSN Spoke with son [name] [phone], concerning pt's discharge plan. Plan to wait until after conference with Dr. [name].  05/16/14 MMcGibboney, RN, BSN Spoke with pt today and son at bedside, he selected [facility].   05/15/14 MMcGibboney, RN, BSN Spoke with pt and girlfriend at bedside pt want to go home with [facility]. Will check back in am for agency.

## 2014-05-17 NOTE — Progress Notes (Signed)
Physical Therapy Treatment Patient Details Name: Frank [name] MRN: [id number] DOB: [birth year] Today's Date: 05/17/2014    History of Present Illness Abdominal exploration with distal left colon and sigmoid resection with end left colon colostomy 05/03/14.     PT Comments    Pt was only able to amb approx 8 feet due to HR increased from 109 at rest to 158 with MAX c/o fatigue and weakness.  Follow Up Recommendations  Home health PT;Supervision/Assistance - 24 hour     Equipment Recommendations  Rolling walker with 5" wheels    Recommendations for Other Services       Precautions / Restrictions Precautions Precautions: Fall Precaution Comments: ABD binder/colostomy Restrictions Weight Bearing Restrictions: No    Mobility  Bed Mobility               General bed mobility comments: pt oob in recliner.  Transfers Overall transfer level: Needs assistance Equipment used: Rolling walker (2 wheeled) Transfers: Sit to/from Stand Sit to Stand: Min assist;+2 safety/equipment         General transfer comment: + 2 for safety due to c/o dizziness and max c/o fatigue.  Ambulation/Gait Ambulation/Gait assistance: Min guard;Min assist Ambulation Distance (Feet): 8 Feet Assistive device: Rolling walker (2 wheeled) Gait Pattern/deviations: Step-through pattern;Decreased stride length;Trunk flexed Gait velocity: decreased   General Gait Details: + 2 assist for safety such that recliner was following.  Limited amb distance due to MAX c/o fatigue and HR increased from 109 at rest to 158.     Stairs            Wheelchair Mobility    Modified Rankin (Stroke Patients Only)       Balance                                    Cognition                            Exercises      General Comments        Pertinent Vitals/Pain Pain Assessment: 0-10 Pain Score: 2  Pain Location: ABD Pain Descriptors / Indicators: Sore Pain  Intervention(s): Monitored during session;Repositioned    Home Living                      Prior Function            PT Goals (current goals can now be found in the care plan section) Progress towards PT goals: Progressing toward goals    Frequency  Min 3X/week    PT Plan      Co-evaluation             End of Session Equipment Utilized During Treatment: Gait belt Activity Tolerance: Patient limited by fatigue Patient left: in chair;with call bell/phone within reach;Other (comment) (Tacky)     Time: 2409-7353 PT Time Calculation (min) (ACUTE ONLY): 15 min  Charges:  $Gait Training: 8-22 mins                     G Codes:      Rica Koyanagi  PTA [facility]  Acute  Rehab Pager      [phone]

## 2014-05-17 NOTE — Progress Notes (Signed)
14 Days Post-Op  Subjective: He looks a little better, but says he just woke up and he's not sure how he feels.  Additional labs being drawn.  Open wound site looks fine, Ostomy working.   I told him it was OK to get up and move, but I think he is still scared from skin bleeder 2 days ago.   Objective: Vital signs in last 24 hours: Temp:  [97.8 F (36.6 C)-98.4 F (36.9 C)] 98.4 F (36.9 C) (01/07 0553) Pulse Rate:  [47-112] 47 (01/07 0553) Resp:  [18-20] 20 (01/07 0553) BP: (104-137)/(53-82) 118/77 mmHg (01/07 0553) SpO2:  [94 %-99 %] 94 % (01/07 0553) Last BM Date: 05/16/14 462 PO 250 from Ostomy Afebrile, VSS K+ 3.1, Alk Phos is still very elveated WBC remains elevated with no obvious source C diff was negative Intake/Output from previous day: 01/06 0701 - 01/07 0700 In: 1047 [P.O.:462; I.V.:250; Blood:335] Out: 2720 [Urine:2470; Stool:250] Intake/Output this shift:    General appearance: alert, cooperative and no distress Resp: clear to auscultation bilaterally and anterior exam. GI: soft sore, open wound OK, ostomy working.  Lab Results:   Recent Labs  05/16/14 0600 05/17/14 0507  WBC 18.0* 18.9*  HGB 7.3* 9.9*  HCT 22.0* 30.0*  PLT 136* 80*    BMET  Recent Labs  05/16/14 0600 05/17/14 0507  NA 141 138  K 4.1 3.1*  CL 110 106  CO2 25 25  GLUCOSE 109* 138*  BUN 16 15  CREATININE 0.87 0.76  CALCIUM 7.4* 7.4*   PT/INR No results for input(s): LABPROT, INR in the last 72 hours.   Recent Labs Lab 05/16/14 0600 05/17/14 0507  AST 41* 43*  ALT 15 17  ALKPHOS 1298* 1184*  BILITOT 1.4* 1.4*  PROT 4.6* 4.9*  ALBUMIN 1.6* 1.9*     Lipase     Component Value Date/Time   LIPASE <10* 05/03/2014 0130     Studies/Results: Ct Abdomen Pelvis W Contrast  05/15/2014   CLINICAL DATA:  Perforation of colon.  EXAM: CT ABDOMEN AND PELVIS WITH CONTRAST  TECHNIQUE: Multidetector CT imaging of the abdomen and pelvis was performed using the standard protocol  following bolus administration of intravenous contrast.  CONTRAST:  163mL OMNIPAQUE IOHEXOL 300 MG/ML  SOLN  COMPARISON:  CT scan of May 02, 2014.  FINDINGS: Severe degenerative disc disease is noted at L5-S1. Moderate bilateral pleural effusions are noted with adjacent subsegmental atelectasis.  Mild anasarca is noted which is increased compared to prior exam. Status post left colon resection with colostomy seen in left lower quadrant. Mild ascites is noted. There is no evidence of bowel obstruction. Cholelithiasis is again noted. Multiple hepatic masses are noted consistent with metastatic disease. These are unchanged compared to prior exam. Adrenal glands and kidneys appear normal. No hydronephrosis or renal obstruction is noted. Atherosclerotic calcifications of abdominal aorta are noted without aneurysm formation. Urinary bladder is decompressed. Small fat containing left inguinal hernia is noted. No definite abscess is seen at this time. Mild pneumoperitoneum is noted in the epigastric region most consistent with recent surgery. 18 x 11 mm retroperitoneal lymph node is noted on image number 28 which is unchanged compared to prior exam.  IMPRESSION: Status post partial left colectomy with colostomy seen in left lower quadrant. No abscess is noted at this time. Mild pneumoperitoneum is noted in the epigastric region consistent with recent surgery.  Moderate bilateral pleural effusions are noted with adjacent subsegmental atelectasis.  Stable multiple hepatic metastatic lesions compared  to prior exam. Stable enlarged retroperitoneal lymph node.  New mild ascites is noted in the abdomen.  Mild anasarca is noted.  Cholelithiasis is again noted.   Electronically Signed   By: Sabino Dick M.D.   On: 05/15/2014 16:23    Medications: . sodium chloride   Intravenous Once  . sodium chloride   Intravenous Once  . antiseptic oral rinse  7 mL Mouth Rinse BID  . diphenhydrAMINE  25 mg Oral Once  .  diphenhydrAMINE  25 mg Oral Once  . feeding supplement (RESOURCE BREEZE)  1 Container Oral q12n4p  . furosemide  40 mg Intravenous BID  . heparin lock flush  500 Units Intracatheter Q30 days  . metoprolol tartrate  75 mg Oral BID  . miconazole nitrate   Topical BID  . pantoprazole  40 mg Oral Daily  . saccharomyces boulardii  250 mg Oral BID  . sodium chloride  10 mL Intravenous Q12H    Assessment/Plan 1. 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 Has been on active chemotx supervised by Dr. Wanda Plump. [name]. 2. . SIGMOID AND LEFT COLON COLECTOMY, END COLOSTOMY, MOBILIZATION OF SPLENIC FLEXURE - 05/03/2014 - D. Newman For perforated sigmoid colon at site of colon cancer -  3. History of CAD/chronic fluid overload  4. leukocytosis of uncertain etiology 5. Malnutrition 6. HTN 7. Anemia, acute and chronic - Hgb - 9.9 - post transfusion 8. Volume overload 9. Hypokalemia being treated   Plan:  I think from our standpoint he is doing as well as we can expect. Nutrition and mobilization are about the only things that can be done to improve his recovery from a surgical standpoint.  The CT on 05/15/14 did not give [place] a cause for his elevated WBC. Antibiotics discontinued on 05/14/14.  Agree with Dr. Verlon Au that elevated LFT's most like attributed to cancer.  He is on a regular diet, but I think his PO intake is still very poor.  Anemia is better after transfusion.    LOS: 15 days    Adylene Dlugosz 05/17/2014

## 2014-05-17 NOTE — Progress Notes (Signed)
[facility] PROGRESS NOTE  ROMULO OKRAY FAO:[id number] DOB: August 26, 1945 DOA: 05/02/2014 PCP: [name], MD  Brief history 69 y.o. male with Past medical history of metastatic colon cancer to liver, hypertension, coronary artery disease status post CABG, diastolic dysfunction presented with abdominal pain, nausea, and vomiting with generalized weakness. CT of the abdomen and pelvis on the day of admission revealed sigmoid colonic perforation with diffuse [place] rim enhancing large volume ascites. General surgery was consulted, and the patient was taken to the operating room where he had a hemicolectomy and end colostomy on 05/03/2014. The patient's diet was gradually advanced as his bowel function returned. Palliative medicine was consulted and the patient was changed to DO NOT RESUSCITATE. Although there has been discussion regarding stopping any further chemotherapy and going home with hospice, the patient has not yet made a definitive decision. Dr. [name] continues to follow.   Assessment/Plan: #1 abdominal pain nausea vomiting secondary to perforated sigmoid colon carcinoma with multiple interloop abscesses and advanced metastatic disease to the liver status post abdominal exploration Colectomy + left colon colostomy 12/24 Afebrile but WBC trending back up-stopped Abx after course from 12/23 until 05/14/14.  Patient tolerating a soft diet.  Normal saline lock IV fluids.  CT of the abdomen and pelvis  ne for abcess 05/15/13  General surgery following and appreciate input and recommendations.  #2 volume overload/scrotal edema Likely secondary to aggressive fluid hydration.  Patient with 2-3+ bilateral lower extremity edema and scrotal edema.  Scrotal ultrasound with marketed scrotal wall edema without abscess. No acute intrascrotal finding. Change IV Lasix to oral lasix.  Elevated Alk Phos 1200's, T bili up  Defer to Oncology No obvious reason other than potential Metastatic spread  as seen on CT 1/5 Consider [place] abd pelvis dependant on how aggressive Oncology and Patient wish to be Will discuss further c Family  #3 left upper extremity edema Doppler ultrasound negative for DVT. Continue Lasix.  #4 Anemia/TCP Secondary to chemotherapy. Counts improving. Patient has been seen by oncology. Patient lost almost 1 L of blood during surgery and received 2 units of packed red blood cells. Trasnfused again 05/16/13 Due to patient's history of coronary artery disease goal is to keep hemoglobin greater than 8. P atient noted to have a leukocytosis although on antibiotics.  White count now at 18 Abx stopped 05/14/14  #5 tachycardia Was thought initially to be secondary to pain blood loss and dehydration. Patient in normal sinus rhythm. Continue current regimen of  beta blocker. Normal saline lock IV fluids. Follow.  #6 hypertension Improved. Continue current regimen of Lopressor.  #7 chronic diastolic heart failure/volume overload Patient looks volume overloaded on examination. Patient's current weight 93kg. Patient's weight on admission was approximately 78.6 kg.  NSL IV fluids. I/O = -7.8.   Change  To IV Lasix 05/16/14.  Gross anasarca Strict I's and O's. Daily weights.  Continue metoprolol 75 bid.  #8 history of coronary artery disease status post CABG Patient with some shortness of breath on ambulation however denies any chest pain. Continue beta blocker.   #9 metastatic colon cancer Outpatient follow-up with oncology.  #10 hypokalemia/hypomagnesemia Likely secondary to diureses. Replete.  #11 goals of care Patient has been seen by palliative care. Patient is currently DO NOT RESUSCITATE.   #12 prophylaxis PPI for GI prophylaxis. Lovenox for DVT prophylaxis.  Code Status: DO NOT RESUSCITATE Family Communication: Updated patient, no family at bedside. Disposition Plan: Home when medically stable with home health therapies   Consultants:  Palliative care: Dr.  [name] 05/03/2014  Oncology: Dr. should that 05/03/2014   Gen. surgery: Dr. Hassell Done 05/03/2014  Procedures:  CT abdomen and pelvis 05/02/2014  CT head 05/02/2014  Chest x-ray 05/03/2014  Scrotal ultrasound 05/10/2014  Abdominal exploration with distal left colon and sigmoid colon resection and and left colon colostomy 05/03/2014 per Dr. Lucia Gaskins  2 units packed red blood cells  Left upper extremity Doppler ultrasound 05/09/2014  I U PRBC 05/16/14  Antibiotics:  Oral Levaquin 05/10/2014>>> 05/14/2014  Oral Flagyl 05/10/2014>>>>> 05/14/2014  IV cefepime 05/03/2014>>>>> 05/10/2014  IV ciprofloxacin 05/03/2014>>> 05/03/2014  IV Flagyl 05/03/2014>>>>> 05/10/2014  HPI/Subjective:  Alert responsive Looks fair No further bleed from stomach Tolerated pot roast No feverorchills  Objective: Filed Vitals:   05/17/14 1439  BP: 101/62  Pulse: 64  Temp:   Resp: 19    Intake/Output Summary (Last 24 hours) at 05/17/14 1855 Last data filed at 05/17/14 1440  Gross per 24 hour  Intake   1052 ml  Output   3020 ml  Net  -1968 ml   Filed Weights   05/13/14 0616 05/14/14 0456 05/17/14 1055  Weight: 94.484 kg (208 lb 4.8 oz) 93.169 kg (205 lb 6.4 oz) 78.064 kg (172 lb 1.6 oz)    Exam:   General: NAD   Cardiovascular: RRR  Respiratory: CTAB. No rhonchi.  Abdomen: Wound not examined  Musculoskeletal: No clubbing or cyanosis. 2-3+ bilateral lower extremity edema improving.  Data Reviewed: Basic Metabolic Panel:  Recent Labs Lab 05/13/14 0608 05/14/14 0545 05/15/14 0500 05/16/14 0600 05/17/14 0507  NA 141 137 138 141 138  K 3.0* 3.3* 3.2* 4.1 3.1*  CL 114* 110 109 110 106  CO2 _0 GLUCOSE 121* 131* 140* 109* 138*  BUN _1 CREATININE 0.86 0.87 0.86 0.87 0.76  CALCIUM 7.5* 7.3* 7.3* 7.4* 7.4*  MG 1.3* 1.8  --  1.6  --    Liver Function Tests:  Recent Labs Lab 05/16/14 0600 05/17/14 0507  AST 41* 43*  ALT 15 17  ALKPHOS  1298* 1184*  BILITOT 1.4* 1.4*  PROT 4.6* 4.9*  ALBUMIN 1.6* 1.9*   No results for input(s): LIPASE, AMYLASE in the last 168 hours. No results for input(s): AMMONIA in the last 168 hours. CBC:  Recent Labs Lab 05/13/14 0608 05/14/14 0545 05/15/14 0500 05/16/14 0600 05/17/14 0507  WBC 15.0* 14.4* 15.5* 18.0* 18.9*  HGB 10.5* 9.7* 9.4* 7.3* 9.9*  HCT 32.7* 30.4* 28.7* 22.0* 30.0*  MCV 85.6 85.9 86.7 86.6 84.7  PLT 167 150 147* 136* 80*   Cardiac Enzymes: No results for input(s): CKTOTAL, CKMB, CKMBINDEX, TROPONINI in the last 168 hours. BNP (last 3 results) No results for input(s): PROBNP in the last 8760 hours. CBG: No results for input(s): GLUCAP in the last 168 hours.  Recent Results (from the past 240 hour(s))  Clostridium Difficile by PCR     Status: None   Collection Time: 05/15/14  7:35 AM  Result Value Ref Range Status   C difficile by pcr NEGATIVE NEGATIVE Final    Comment: Performed at [facility]     Studies: No results found.  Scheduled Meds: . sodium chloride   Intravenous Once  . sodium chloride   Intravenous Once  . antiseptic oral rinse  7 mL Mouth Rinse BID  . diphenhydrAMINE  25 mg Oral Once  . diphenhydrAMINE  25 mg Oral Once  . feeding supplement (RESOURCE BREEZE)  1 Container  Oral q12n4p  . furosemide  40 mg Intravenous BID  . heparin lock flush  500 Units Intracatheter Q30 days  . metoprolol tartrate  75 mg Oral BID  . miconazole nitrate   Topical BID  . mirtazapine  15 mg Oral QHS  . pantoprazole  40 mg Oral Daily  . saccharomyces boulardii  250 mg Oral BID  . sodium chloride  10 mL Intravenous Q12H   Continuous Infusions:    Principal Problem:   Perforation of colon cancer s/p colectomy/colostomy 05/03/2014 Active Problems:   Liver masses   Acute blood loss anemia   Malignant neoplasm of colon   Abdominal pain   Dehydration   Mucositis due to antineoplastic therapy   Intractable nausea and vomiting   Acute  encephalopathy   Perforated bowel   Palliative care encounter   Essential hypertension   Colon cancer   AP (abdominal pain)   Volume overload   Scrotal edema   Hypokalemia   Protein-calorie malnutrition, severe   Loss of appetite   Adjustment disorder with mixed anxiety and depressed mood    Time spent: 35 mins  Verneita Griffes, MD [facility] (P) [phone]

## 2014-05-18 LAB — CBC WITH DIFFERENTIAL/PLATELET
BASOS PCT: 0 % (ref 0–1)
Basophils Absolute: 0 10*3/uL (ref 0.0–0.1)
Eosinophils Absolute: 0 10*3/uL (ref 0.0–0.7)
Eosinophils Relative: 0 % (ref 0–5)
HEMATOCRIT: 29 % — AB (ref 39.0–52.0)
Hemoglobin: 9.6 g/dL — ABNORMAL LOW (ref 13.0–17.0)
Lymphocytes Relative: 8 % — ABNORMAL LOW (ref 12–46)
Lymphs Abs: 1.5 10*3/uL (ref 0.7–4.0)
MCH: 28 pg (ref 26.0–34.0)
MCHC: 33.1 g/dL (ref 30.0–36.0)
MCV: 84.5 fL (ref 78.0–100.0)
Monocytes Absolute: 2.5 10*3/uL — ABNORMAL HIGH (ref 0.1–1.0)
Monocytes Relative: 14 % — ABNORMAL HIGH (ref 3–12)
Neutro Abs: 14.1 10*3/uL — ABNORMAL HIGH (ref 1.7–7.7)
Neutrophils Relative %: 78 % — ABNORMAL HIGH (ref 43–77)
Platelets: 103 10*3/uL — ABNORMAL LOW (ref 150–400)
RBC: 3.43 MIL/uL — AB (ref 4.22–5.81)
RDW: 17.9 % — AB (ref 11.5–15.5)
WBC: 18 10*3/uL — ABNORMAL HIGH (ref 4.0–10.5)

## 2014-05-18 LAB — PROTIME-INR
INR: 1.73 — ABNORMAL HIGH (ref 0.00–1.49)
Prothrombin Time: 20.4 seconds — ABNORMAL HIGH (ref 11.6–15.2)

## 2014-05-18 LAB — COMPREHENSIVE METABOLIC PANEL
ALT: 14 U/L (ref 0–53)
AST: 28 U/L (ref 0–37)
Albumin: 1.8 g/dL — ABNORMAL LOW (ref 3.5–5.2)
Alkaline Phosphatase: 1170 U/L — ABNORMAL HIGH (ref 39–117)
Anion gap: 9 (ref 5–15)
BUN: 16 mg/dL (ref 6–23)
CALCIUM: 7.2 mg/dL — AB (ref 8.4–10.5)
CO2: 25 mmol/L (ref 19–32)
Chloride: 102 mEq/L (ref 96–112)
Creatinine, Ser: 0.89 mg/dL (ref 0.50–1.35)
GFR calc Af Amer: >90 mL/min (ref >90)
GFR calc non Af Amer: 85 mL/min — ABNORMAL LOW (ref >90)
Glucose, Bld: 110 mg/dL — ABNORMAL HIGH (ref 70–99)
POTASSIUM: 3.2 mmol/L — AB (ref 3.5–5.1)
Sodium: 136 mmol/L (ref 135–145)
Total Bilirubin: 1.3 mg/dL — ABNORMAL HIGH (ref 0.3–1.2)
Total Protein: 4.8 g/dL — ABNORMAL LOW (ref 6.0–8.3)

## 2014-05-18 LAB — HAPTOGLOBIN: HAPTOGLOBIN: 280 mg/dL — AB (ref 43–212)

## 2014-05-18 MED ORDER — SILVER NITRATE-POT NITRATE 75-25 % EX MISC
10.0000 | CUTANEOUS | Status: DC | PRN
  Filled 2014-05-18: qty 10

## 2014-05-18 MED ORDER — ONDANSETRON 4 MG PO TBDP
4.0000 mg | ORAL_TABLET | Freq: Three times a day (TID) | ORAL | Status: DC | PRN
  Filled 2014-05-18: qty 1

## 2014-05-18 NOTE — Progress Notes (Signed)
Patient Frank [name]      DOB: 1945-09-30      XYD:[id number]   Palliative Medicine Team at [facility] Progress Note    Subjective: Pain controlled. Appetite better. Ate most of pot-roast served last night. No nausea.      Filed Vitals:   05/18/14 0444  BP: 121/60  Pulse: 56  Temp: 97.6 F (36.4 C)  Resp: 20   Physical exam: GEN: alert, NAD HEENT: [place], sclera anicteric CV: RRR LUNGS: CTAB ABD: soft, ND EXT: warm   CBC    Component Value Date/Time   WBC 18.0* 05/18/2014 0420   WBC 1.1* 05/02/2014 1510   RBC 3.43* 05/18/2014 0420   RBC 3.65* 05/02/2014 1510   HGB 9.6* 05/18/2014 0420   HGB 9.8* 05/02/2014 1510   HCT 29.0* 05/18/2014 0420   HCT 30.7* 05/02/2014 1510   PLT 103* 05/18/2014 0420   PLT 85* 05/02/2014 1510   MCV 84.5 05/18/2014 0420   MCV 84.1 05/02/2014 1510   MCH 28.0 05/18/2014 0420   MCH 26.8* 05/02/2014 1510   MCHC 33.1 05/18/2014 0420   MCHC 31.9* 05/02/2014 1510   RDW 17.9* 05/18/2014 0420   RDW 17.2* 05/02/2014 1510   LYMPHSABS 1.5 05/18/2014 0420   LYMPHSABS 0.1* 05/02/2014 1510   MONOABS 2.5* 05/18/2014 0420   MONOABS 0.2 05/02/2014 1510   EOSABS 0.0 05/18/2014 0420   EOSABS 0.0 05/02/2014 1510   BASOSABS 0.0 05/18/2014 0420   BASOSABS 0.0 05/02/2014 1510    CMP     Component Value Date/Time   NA 136 05/18/2014 0420   NA 136 05/02/2014 1511   K 3.2* 05/18/2014 0420   K 4.0 05/02/2014 1511   CL 102 05/18/2014 0420   CO2 25 05/18/2014 0420   CO2 22 05/02/2014 1511   GLUCOSE 110* 05/18/2014 0420   GLUCOSE 145* 05/02/2014 1511   BUN 16 05/18/2014 0420   BUN 23.7 05/02/2014 1511   CREATININE 0.89 05/18/2014 0420   CREATININE 1.0 05/02/2014 1511   CALCIUM 7.2* 05/18/2014 0420   CALCIUM 8.4 05/02/2014 1511   PROT 4.8* 05/18/2014 0420   PROT 5.6* 05/02/2014 1511   ALBUMIN 1.8* 05/18/2014 0420   ALBUMIN 2.0* 05/02/2014 1511   AST 28 05/18/2014 0420   AST 9 05/02/2014 1511   ALT 14 05/18/2014 0420   ALT 9 05/02/2014  1511   ALKPHOS 1170* 05/18/2014 0420   ALKPHOS 104 05/02/2014 1511   BILITOT 1.3* 05/18/2014 0420   BILITOT 0.79 05/02/2014 1511   GFRNONAA 85* 05/18/2014 0420   GFRAA >90 05/18/2014 0420      Assessment and plan: 69 yo male with PMHx of stage IV Colon CA, CAD who presented with abdominal pain, N/V and found to have perforated sigmoid colon with peritonitis  1. Code Status: DNR  2. Goals of Care: Spoke with Frank [name] again this morning. I asked him again about his goals from here. Last week he was intent on going home with hospice and that had seemed to change. Today, he again indicated to me that he wishes to go home and pursue hospice care at that time.  However, his significant other Frank [name] will be out of town until Monday.  He really does not want to go to rehab as he has had bad experience with that in the past. We again talked about hospice philosophy and how rehab would not be offered there.  He understood this and understandably wants to maximize his time at home.  We talked about  prognosis being potentially in the weeks to months range.  I think he appears weaker than he did last week after surgery and his elevated LFT's including INR and low albumin remain concerning.  His son will come in some time this afternoon and I have left my number so I can visit with family as well to discuss these issues.    In speaking with Frank [name] about goals, I think it is reasonable to not check labs while remains inpatient.  Would only check labs if re-bleeding or suspicion for new issues which can be easily identified on blood work.  He agrees with this.     3. Symptom Management:  1. Post-Op Pain: controlled with PRN percocet 2. Loss of Appetite- continue remeron 3. ? Depression vs Adjustment Disorder- remeron started 1/7   Total Time: 35 minutes >50% of time spent in counseling and coordination of care regarding above. Case discussed today with Dr Fayne Mediate  D.O. Palliative Medicine Team at [facility]  Pager: [phone] Team Phone: [phone]

## 2014-05-18 NOTE — Progress Notes (Signed)
15 Days Post-Op  Subjective: No real change, he feels weak, no appetite, so his PO intake is poor.  His WBC and LFt's are unchanged.  He is trying to bleed again from that same site.  I am getting some more silver nitrate sticks.  Still has some purulent drainage from the base of the wound/deepest portion of the open abdominal wound.  This does go down to the fascia.  With poor PO intake I am surprised not more of the wound is like this.  I ask him if they had talked about going home and Hospice.  He said they were still thinking about what to do.    Objective: Vital signs in last 24 hours: Temp:  [97.6 F (36.4 C)-98.3 F (36.8 C)] 97.6 F (36.4 C) (01/08 0444) Pulse Rate:  [56-158] 56 (01/08 0444) Resp:  [18-20] 20 (01/08 0444) BP: (101-121)/(60-74) 121/60 mmHg (01/08 0444) SpO2:  [92 %-94 %] 92 % (01/08 0444) Weight:  [78.064 kg (172 lb 1.6 oz)-89.449 kg (197 lb 3.2 oz)] 89.449 kg (197 lb 3.2 oz) (01/08 0455) Last BM Date: 05/18/14 580 PO recorded 275 from ostomy Afebrile, VSS H/H stable  WBC still up, K+3.2 Intake/Output from previous day: 01/07 0701 - 01/08 0700 In: 620 [P.O.:580; I.V.:40] Out: 1750 [Urine:1475; Stool:275] Intake/Output this shift: Total I/O In: -  Out: 625 [Urine:625]  General appearance: alert, cooperative, no distress and looks tired and just worn out. Resp: clear to auscultation bilaterally and anterior GI: soft, + BS, not distended.  Open wound is OK some more bleeding at the same site, not much, but I am getting more silver nitrate for site.  Some purulent drainage at the base on wet to dry dressing.  This goes down to the fascia.  ostomy working, gas and stool in the bag.  Lab Results:   Recent Labs  05/17/14 0507 05/18/14 0420  WBC 18.9* 18.0*  HGB 9.9* 9.6*  HCT 30.0* 29.0*  PLT 80* 103*    BMET  Recent Labs  05/17/14 0507 05/18/14 0420  NA 138 136  K 3.1* 3.2*  CL 106 102  CO2 25 25  GLUCOSE 138* 110*  BUN 15 16  CREATININE  0.76 0.89  CALCIUM 7.4* 7.2*   PT/INR  Recent Labs  05/17/14 0830 05/18/14 0420  LABPROT 20.2* 20.4*  INR 1.70* 1.73*     Recent Labs Lab 05/16/14 0600 05/17/14 0507 05/18/14 0420  AST 41* 43* 28  ALT 15 17 14   ALKPHOS 1298* 1184* 1170*  BILITOT 1.4* 1.4* 1.3*  PROT 4.6* 4.9* 4.8*  ALBUMIN 1.6* 1.9* 1.8*     Lipase     Component Value Date/Time   LIPASE <10* 05/03/2014 0130     Studies/Results: No results found.  Medications: . sodium chloride   Intravenous Once  . sodium chloride   Intravenous Once  . antiseptic oral rinse  7 mL Mouth Rinse BID  . diphenhydrAMINE  25 mg Oral Once  . diphenhydrAMINE  25 mg Oral Once  . feeding supplement (RESOURCE BREEZE)  1 Container Oral q12n4p  . furosemide  40 mg Intravenous BID  . heparin lock flush  500 Units Intracatheter Q30 days  . metoprolol tartrate  75 mg Oral BID  . miconazole nitrate   Topical BID  . mirtazapine  15 mg Oral QHS  . pantoprazole  40 mg Oral Daily  . saccharomyces boulardii  250 mg Oral BID  . sodium chloride  10 mL Intravenous Q12H  Assessment/Plan 1. 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 Has been on active chemotx supervised by Dr. Wanda Plump. [name]. 2. . SIGMOID AND LEFT COLON COLECTOMY, END COLOSTOMY, MOBILIZATION OF SPLENIC FLEXURE - 05/03/2014 - D. Newman For perforated sigmoid colon at site of colon cancer -  3. History of CAD/chronic fluid overload  4. leukocytosis of uncertain etiology 5. Malnutrition 6. HTN 7. Anemia, acute and chronic - Hgb - 9.9 - post transfusion 8. Volume overload 9. Hypokalemia being treated 10.  Elevated LFt's.  Plan:  I am going to use some silver nitrate for his open wound at about 11 O'clock site.  Continue wet to dry. Encourage PO intake.       LOS: 16 days    Bobby Barton 05/18/2014

## 2014-05-18 NOTE — Progress Notes (Signed)
[facility] PROGRESS NOTE  TYQUON NEAR TDD:[id number] DOB: [birth year] DOA: 05/02/2014 PCP: [name], MD  Brief history 69 y.o. male with Past medical history of metastatic colon cancer to liver, hypertension, coronary artery disease status post CABG, diastolic dysfunction presented with abdominal pain, nausea, and vomiting with generalized weakness. CT of the abdomen and pelvis on the day of admission revealed sigmoid colonic perforation with diffuse Lahey rim enhancing large volume ascites. General surgery was consulted, and the patient was taken to the operating room where he had a hemicolectomy and end colostomy on 05/03/2014. The patient's diet was gradually advanced as his bowel function returned. Palliative medicine was consulted and the patient was changed to DO NOT RESUSCITATE. Oncology weighed back in on 05/18/14 and have expressed there are slim odd for any further type o fchemotherapy given the burden of his disease and poor ECOG status Patient has elected for comfort care at home with partner.   Assessment/Plan: #1 abdominal pain nausea vomiting secondary to perforated sigmoid colon carcinoma with multiple interloop abscesses and advanced metastatic disease to the liver status post abdominal exploration Colectomy + left colon colostomy 12/24 Afebrile but WBC trending back up-stopped Abx after course from 12/23 until 05/14/14.  Patient tolerating a soft diet.  Normal saline lock IV fluids.  CT of the abdomen and pelvis  ne for abcess 05/15/13 Surgeon requested to advise re: symtpomatic bleeding if d/c home c Hopsice  #2 volume overload/scrotal edema Likely secondary to aggressive fluid hydration.  Patient with 2-3+ bilateral lower extremity edema and scrotal edema. Scrotal ultrasound with marketed scrotal wall edema without abscess. No acute intrascrotal finding. Continue Oral lasix PO to comfort  Elevated Alk Phos 1200's, T bili up  Defer to Oncology No obvious reason  other than potential Metastatic spread as seen on CT 1/5 Stop checking labs as per discussion c patient and family  #3 left upper extremity edema Doppler ultrasound negative for DVT. Continue Lasix.  #4 Anemia/TCP Secondary to chemotherapy. Counts improving. Patient has been seen by oncology. Patient lost almost 1 L of blood during surgery and received 2 units of packed red blood cells. Trasnfused again 05/16/13 Due to patient's history of coronary artery disease goal is to keep hemoglobin greater than 8. P atient noted to have a leukocytosis although on antibiotics.  White count now at 18 Abx stopped 05/14/14 NO further lab draws  #5 tachycardia Was thought initially to be secondary to pain blood loss and dehydration. Patient in normal sinus rhythm. Continue current regimen of  beta blocker. Normal saline lock IV fluids. Follow.  #6 hypertension Improved. Continue current regimen of Lopressor.  #7 chronic diastolic heart failure/volume overload Patient looks volume overloaded on examination. Patient's current weight 93kg. Patient's weight on admission was approximately 78.6 kg.  NSL IV fluids. I/O = -7.8.   Change  To IV Lasix 05/16/14.  Gross anasarca Stop checking I/o and labs now as more comfort care  #8 history of coronary artery disease status post CABG Patient with some shortness of breath on ambulation however denies any chest pain. Continue beta blocker.   #9 metastatic colon cancer Outpatient follow-up with oncology.  #10 hypokalemia/hypomagnesemia Likely secondary to diureses. Replete.  #11 goals of care Patient has been seen by palliative care. Patient is currently DO NOT RESUSCITATE.  Heis considering going home with home Hospice once his GF returns from [facility]  #12 prophylaxis PPI for GI prophylaxis. Lovenox for DVT prophylaxis.  Code Status: DO NOT RESUSCITATE Family Communication: Updated  patient, no family at bedside. Disposition Plan: Home when medically stable  with home health therapies   Consultants:  Palliative care: Dr. [name] 05/03/2014  Oncology: Dr. should that 05/03/2014   Gen. surgery: Dr. Hassell Done 05/03/2014  Procedures:  CT abdomen and pelvis 05/02/2014  CT head 05/02/2014  Chest x-ray 05/03/2014  Scrotal ultrasound 05/10/2014  Abdominal exploration with distal left colon and sigmoid colon resection and and left colon colostomy 05/03/2014 per Dr. Lucia Gaskins  2 units packed red blood cells  Left upper extremity Doppler ultrasound 05/09/2014  I U PRBC 05/16/14  Antibiotics:  Oral Levaquin 05/10/2014>>> 05/14/2014  Oral Flagyl 05/10/2014>>>>> 05/14/2014  IV cefepime 05/03/2014>>>>> 05/10/2014  IV ciprofloxacin 05/03/2014>>> 05/03/2014  IV Flagyl 05/03/2014>>>>> 05/10/2014  HPI/Subjective:  Mild bleeding from stomach this am cauterize dby Gen surg Tolerting diet Alert Mild epig pain No n/v No CP   Objective: Filed Vitals:   05/18/14 1016  BP: 135/63  Pulse: 65  Temp:   Resp:     Intake/Output Summary (Last 24 hours) at 05/18/14 1454 Last data filed at 05/18/14 1216  Gross per 24 hour  Intake    540 ml  Output   1875 ml  Net  -1335 ml   Filed Weights   05/14/14 0456 05/17/14 1055 05/18/14 0455  Weight: 93.169 kg (205 lb 6.4 oz) 78.064 kg (172 lb 1.6 oz) 89.449 kg (197 lb 3.2 oz)    Exam:   General: NAD   Cardiovascular: RRR  Respiratory: CTAB. No rhonchi.  Abdomen: Wound not examined  Musculoskeletal: No clubbing or cyanosis. 2-3+ bilateral lower extremity edema improving.  Data Reviewed: Basic Metabolic Panel:  Recent Labs Lab 05/13/14 0608 05/14/14 0545 05/15/14 0500 05/16/14 0600 05/17/14 0507 05/18/14 0420  NA 141 137 138 141 138 136  K 3.0* 3.3* 3.2* 4.1 3.1* 3.2*  CL 114* 110 109 110 106 102  CO2 $Re'21 22 23 25 25 25  'aFN$ GLUCOSE 121* 131* 140* 109* 138* 110*  BUN $Re'12 13 13 16 15 16  'JCL$ CREATININE 0.86 0.87 0.86 0.87 0.76 0.89  CALCIUM 7.5* 7.3* 7.3* 7.4* 7.4* 7.2*  MG 1.3*  1.8  --  1.6  --   --    Liver Function Tests:  Recent Labs Lab 05/16/14 0600 05/17/14 0507 05/18/14 0420  AST 41* 43* 28  ALT $Re'15 17 14  'vQb$ ALKPHOS 1298* 1184* 1170*  BILITOT 1.4* 1.4* 1.3*  PROT 4.6* 4.9* 4.8*  ALBUMIN 1.6* 1.9* 1.8*   No results for input(s): LIPASE, AMYLASE in the last 168 hours. No results for input(s): AMMONIA in the last 168 hours. CBC:  Recent Labs Lab 05/14/14 0545 05/15/14 0500 05/16/14 0600 05/17/14 0507 05/18/14 0420  WBC 14.4* 15.5* 18.0* 18.9* 18.0*  NEUTROABS  --   --   --   --  14.1*  HGB 9.7* 9.4* 7.3* 9.9* 9.6*  HCT 30.4* 28.7* 22.0* 30.0* 29.0*  MCV 85.9 86.7 86.6 84.7 84.5  PLT 150 147* 136* 80* 103*   Cardiac Enzymes: No results for input(s): CKTOTAL, CKMB, CKMBINDEX, TROPONINI in the last 168 hours. BNP (last 3 results) No results for input(s): PROBNP in the last 8760 hours. CBG: No results for input(s): GLUCAP in the last 168 hours.  Recent Results (from the past 240 hour(s))  Clostridium Difficile by PCR     Status: None   Collection Time: 05/15/14  7:35 AM  Result Value Ref Range Status   C difficile by pcr NEGATIVE NEGATIVE Final    Comment: Performed at [facility]     Studies: No results found.  Scheduled Meds: . antiseptic oral rinse  7 mL Mouth Rinse BID  . feeding supplement (RESOURCE BREEZE)  1 Container Oral q12n4p  . furosemide  40 mg Intravenous BID  . heparin lock flush  500 Units Intracatheter Q30 days  . metoprolol tartrate  75 mg Oral BID  . miconazole nitrate   Topical BID  . mirtazapine  15 mg Oral QHS  . pantoprazole  40 mg Oral Daily  . saccharomyces boulardii  250 mg Oral BID  . sodium chloride  10 mL Intravenous Q12H   Continuous Infusions:    Principal Problem:   Perforation of colon cancer s/p colectomy/colostomy 05/03/2014 Active Problems:   Liver masses   Acute blood loss anemia   Malignant neoplasm of colon   Abdominal pain   Dehydration   Mucositis due to antineoplastic  therapy   Intractable nausea and vomiting   Acute encephalopathy   Perforated bowel   Palliative care encounter   Essential hypertension   Colon cancer   AP (abdominal pain)   Volume overload   Scrotal edema   Hypokalemia   Protein-calorie malnutrition, severe   Loss of appetite   Adjustment disorder with mixed anxiety and depressed mood    Time spent: 35 mins  Verneita Griffes, MD [facility] (P) [phone]

## 2014-05-18 NOTE — Consult Note (Signed)
WOC ostomy follow up Stoma type/location: LLQ Colosotmy Stomal assessment/size: 1 and 5/8 inches round, moist Peristomal assessment: intact, clear Treatment options for stomal/peristomal skin: none indicated Output soft brown stool in a small amount, flatus in pouch Ostomy pouching: 2pc. , 2 and 1/4 inch with skin barrier ring Education provided: Support provided for patient who tells me today that his progress is poor and that he has decided to go home with [facility] and Hospice care.  He tells me that he had been hoping for 12-20 months, but now knows with this liver function at about 20%, that his time is much less.  He is not tearful today. He tells me that all are looking toward a discharge on Tuesday of next week (next pouch change is due on Monday, 1/11).  His significant other, girlfriend, is out of town this weekend and will return Sunday pm. Enrolled patient in [place] Start Discharge program: Yes [place] nursing team will follow, and will remain available to this patient, the nursing and medical teams.   Thanks, Maudie Flakes, MSN, RN, [place], [place], [place] [phone])

## 2014-05-18 NOTE — Progress Notes (Signed)
CARE MANAGEMENT NOTE 05/18/2014  Patient:  Frank [name], Frank [name]   Account Number:  [id number]  Date Initiated:  05/15/2014  Documentation initiated by:  Karl Bales  Subjective/Objective Assessment:   pt admitted with cco perforation of colon     Action/Plan:   from home   Anticipated DC Date:  05/18/2014   Anticipated DC Plan:  [place]  CM consult      [facility] Choice  HOSPICE   Choice offered to / List presented to:  C-1 Patient        [place] arranged  HH-1 RN      [name]   Status of service:  In process, will continue to follow Medicare Important Message given?  YES (If response is "NO", the following Medicare IM given date fields will be blank) Date Medicare IM given:  05/15/2014 Medicare IM given by:  Karl Bales Date Additional Medicare IM given:   Additional Medicare IM given by:    Discharge Disposition:    Per UR Regulation:  Reviewed for med. necessity/level of care/duration of stay  If discussed at [place] of Stay Meetings, dates discussed:   05/17/2014  05/15/2014    Comments:  05/18/14 MMcGibboney, RN, BSN Spoke with pt and son Frank [name] at bedside concerning discharge.  [facility] referral called to in house rep [phone]. Pt  states, " I want to stay until Tues, that's when Frank [name] ( pt's girlfriend) will be back home".   Explained to pt and son, when discharge orders are written,  he can not stay in the hospital until his girlfriend returns home. Other arrangement will need to be made. Also asked son to call Frank [name] to let her know pt was ready for discharge.  05/17/14 MMcGibboney, RN, BSN Spoke with son Frank [name] [phone], concerning pt's discharge plan. Plan to wait until after conference with Dr. [name].  05/16/14 MMcGibboney, RN, BSN Spoke with pt today and son at bedside, he selected [facility].   05/15/14 MMcGibboney, RN,  BSN Spoke with pt and girlfriend at bedside pt want to go home with [facility]. Will check back in am for agency.

## 2014-05-19 LAB — CBC
HEMATOCRIT: 27.3 % — AB (ref 39.0–52.0)
HEMOGLOBIN: 9.2 g/dL — AB (ref 13.0–17.0)
MCH: 28.9 pg (ref 26.0–34.0)
MCHC: 33.7 g/dL (ref 30.0–36.0)
MCV: 85.8 fL (ref 78.0–100.0)
Platelets: 97 10*3/uL — ABNORMAL LOW (ref 150–400)
RBC: 3.18 MIL/uL — ABNORMAL LOW (ref 4.22–5.81)
RDW: 17.9 % — AB (ref 11.5–15.5)
WBC: 16.7 10*3/uL — AB (ref 4.0–10.5)

## 2014-05-19 LAB — BASIC METABOLIC PANEL
ANION GAP: 8 (ref 5–15)
BUN: 17 mg/dL (ref 6–23)
CHLORIDE: 102 meq/L (ref 96–112)
CO2: 27 mmol/L (ref 19–32)
CREATININE: 0.68 mg/dL (ref 0.50–1.35)
Calcium: 7 mg/dL — ABNORMAL LOW (ref 8.4–10.5)
GFR calc Af Amer: >90 mL/min (ref >90)
GFR calc non Af Amer: >90 mL/min (ref >90)
Glucose, Bld: 121 mg/dL — ABNORMAL HIGH (ref 70–99)
POTASSIUM: 2.8 mmol/L — AB (ref 3.5–5.1)
SODIUM: 137 mmol/L (ref 135–145)

## 2014-05-19 MED ORDER — POTASSIUM CHLORIDE CRYS ER 20 MEQ PO TBCR
40.0000 meq | EXTENDED_RELEASE_TABLET | Freq: Once | ORAL | Status: AC
  Administered 2014-05-19: 40 meq via ORAL
  Filled 2014-05-19 (×2): qty 2

## 2014-05-19 NOTE — Progress Notes (Signed)
[facility] PROGRESS NOTE  Frank [name] GLO:[id number] DOB: 12/08/1945 DOA: 05/02/2014 PCP: [name], MD  Brief history 69 y.o. male with Past medical history of metastatic colon cancer to liver, hypertension, coronary artery disease status post CABG, diastolic dysfunction presented with abdominal pain, nausea, and vomiting with generalized weakness. CT of the abdomen and pelvis on the day of admission revealed sigmoid colonic perforation with diffuse Lahey rim enhancing large volume ascites. General surgery was consulted, and the patient was taken to the operating room where he had a hemicolectomy and end colostomy on 05/03/2014. The patient's diet was gradually advanced as his bowel function returned. Palliative medicine was consulted and the patient was changed to DO NOT RESUSCITATE. Oncology weighed back in on 05/18/14 and have expressed there are slim odd for any further type o fchemotherapy given the burden of his disease and poor ECOG status Patient has elected for comfort care at home with partner.   Assessment/Plan: #1 abdominal pain nausea vomiting secondary to perforated sigmoid colon carcinoma with multiple interloop abscesses and advanced metastatic disease to the liver status post abdominal exploration Colectomy + left colon colostomy 12/24 Afebrile but WBC trending back up-stopped Abx after course from 12/23 until 05/14/14.  Patient tolerating a soft diet.  Normal saline lock IV fluids.  CT of the abdomen and pelvis  ne for abcess 05/15/13 Surgeon requested to advise re: symtpomatic bleeding if d/c home c Hopsice  #2 volume overload/scrotal edema Likely secondary to aggressive fluid hydration.  Patient with 2-3+ bilateral lower extremity edema and scrotal edema. Scrotal ultrasound with marketed scrotal wall edema without abscess. No acute intrascrotal finding. Continue Oral lasix PO to comfort  Elevated Alk Phos 1200's, T bili up  Defer to Oncology No obvious reason  other than potential Metastatic spread as seen on CT 1/5 Stop checking labs as per discussion c patient and family  #3 left upper extremity edema Doppler ultrasound negative for DVT. Continue Lasix.  #4 Anemia/TCP Secondary to chemotherapy. Counts improving. Patient has been seen by oncology. Patient lost almost 1 L of blood during surgery and received 2 units of packed red blood cells. Trasnfused again 05/16/13 Due to patient's history of coronary artery disease goal is to keep hemoglobin greater than 8. P atient noted to have a leukocytosis although on antibiotics.  White count now at 18 Abx stopped 05/14/14 NO further lab draws  #5 tachycardia Was thought initially to be secondary to pain blood loss and dehydration. Patient in normal sinus rhythm. Continue current regimen of  beta blocker. Normal saline lock IV fluids. Follow.  #6 hypertension Improved. Continue current regimen of Lopressor.  #7 chronic diastolic heart failure/volume overload Patient looks volume overloaded on examination. Patient's current weight 93kg. Patient's weight on admission was approximately 78.6 kg.  NSL IV fluids. I/O = -7.8.   Change  To IV Lasix 05/16/14.  Gross anasarca Stop checking I/o and labs now as more comfort care  #8 history of coronary artery disease status post CABG Patient with some shortness of breath on ambulation however denies any chest pain. Continue beta blocker.   #9 metastatic colon cancer Outpatient follow-up with oncology.  #10 hypokalemia/hypomagnesemia Likely secondary to diureses. Repleted this am.  #11 goals of care Patient has been seen by palliative care. Patient is currently DO NOT RESUSCITATE.  Heis considering going home with home Hospice once his GF returns from [facility]  #12 prophylaxis PPI for GI prophylaxis. Lovenox for DVT prophylaxis.  Code Status: DO NOT RESUSCITATE Family  Communication: Updated patient, no family at bedside. Disposition Plan: Home when medically  stable with home health therapies   Consultants:  Palliative care: Dr. [name] 05/03/2014  Oncology: Dr. should that 05/03/2014   Gen. surgery: Dr. Hassell Done 05/03/2014  Procedures:  CT abdomen and pelvis 05/02/2014  CT head 05/02/2014  Chest x-ray 05/03/2014  Scrotal ultrasound 05/10/2014  Abdominal exploration with distal left colon and sigmoid colon resection and and left colon colostomy 05/03/2014 per Dr. Lucia Gaskins  2 units packed red blood cells  Left upper extremity Doppler ultrasound 05/09/2014  I U PRBC 05/16/14  Antibiotics:  Oral Levaquin 05/10/2014>>> 05/14/2014  Oral Flagyl 05/10/2014>>>>> 05/14/2014  IV cefepime 05/03/2014>>>>> 05/10/2014  IV ciprofloxacin 05/03/2014>>> 05/03/2014  IV Flagyl 05/03/2014>>>>> 05/10/2014  HPI/Subjective:  Mno c/o  Weaker No other issues No bleeding today  Objective: Filed Vitals:   05/19/14 1411  BP: 126/56  Pulse: 58  Temp:   Resp: 18    Intake/Output Summary (Last 24 hours) at 05/19/14 1846 Last data filed at 05/19/14 1349  Gross per 24 hour  Intake    150 ml  Output   1750 ml  Net  -1600 ml   Filed Weights   05/17/14 1055 05/18/14 0455 05/19/14 0622  Weight: 78.064 kg (172 lb 1.6 oz) 89.449 kg (197 lb 3.2 oz) 87.952 kg (193 lb 14.4 oz)    Exam:   General: NAD   Cardiovascular: RRR  Respiratory: CTAB. No rhonchi.  Abdomen: Wound not examined  Musculoskeletal: No clubbing or cyanosis. 2-3+ bilateral lower extremity edema improving.  Data Reviewed: Basic Metabolic Panel:  Recent Labs Lab 05/13/14 0608 05/14/14 0545 05/15/14 0500 05/16/14 0600 05/17/14 0507 05/18/14 0420 05/19/14 0418  NA 141 137 138 141 138 136 137  K 3.0* 3.3* 3.2* 4.1 3.1* 3.2* 2.8*  CL 114* 110 109 110 106 102 102  CO2 _0 GLUCOSE 121* 131* 140* 109* 138* 110* 121*  BUN _1 CREATININE 0.86 0.87 0.86 0.87 0.76 0.89 0.68  CALCIUM 7.5* 7.3* 7.3* 7.4* 7.4* 7.2* 7.0*  MG 1.3* 1.8   --  1.6  --   --   --    Liver Function Tests:  Recent Labs Lab 05/16/14 0600 05/17/14 0507 05/18/14 0420  AST 41* 43* 28  ALT _2 ALKPHOS 1298* 1184* 1170*  BILITOT 1.4* 1.4* 1.3*  PROT 4.6* 4.9* 4.8*  ALBUMIN 1.6* 1.9* 1.8*   No results for input(s): LIPASE, AMYLASE in the last 168 hours. No results for input(s): AMMONIA in the last 168 hours. CBC:  Recent Labs Lab 05/15/14 0500 05/16/14 0600 05/17/14 0507 05/18/14 0420 05/19/14 0418  WBC 15.5* 18.0* 18.9* 18.0* 16.7*  NEUTROABS  --   --   --  14.1*  --   HGB 9.4* 7.3* 9.9* 9.6* 9.2*  HCT 28.7* 22.0* 30.0* 29.0* 27.3*  MCV 86.7 86.6 84.7 84.5 85.8  PLT 147* 136* 80* 103* 97*   Cardiac Enzymes: No results for input(s): CKTOTAL, CKMB, CKMBINDEX, TROPONINI in the last 168 hours. BNP (last 3 results) No results for input(s): PROBNP in the last 8760 hours. CBG: No results for input(s): GLUCAP in the last 168 hours.  Recent Results (from the past 240 hour(s))  Clostridium Difficile by PCR     Status: None   Collection Time: 05/15/14  7:35 AM  Result Value Ref Range Status   C difficile by pcr NEGATIVE NEGATIVE Final    Comment:  Performed at [facility]     Studies: No results found.  Scheduled Meds: . antiseptic oral rinse  7 mL Mouth Rinse BID  . feeding supplement (RESOURCE BREEZE)  1 Container Oral q12n4p  . furosemide  40 mg Intravenous BID  . metoprolol tartrate  75 mg Oral BID  . miconazole nitrate   Topical BID  . pantoprazole  40 mg Oral Daily  . sodium chloride  10 mL Intravenous Q12H   Continuous Infusions:    Principal Problem:   Perforation of colon cancer s/p colectomy/colostomy 05/03/2014 Active Problems:   Liver masses   Acute blood loss anemia   Malignant neoplasm of colon   Abdominal pain   Dehydration   Mucositis due to antineoplastic therapy   Intractable nausea and vomiting   Acute encephalopathy   Perforated bowel   Palliative care encounter   Essential  hypertension   Colon cancer   AP (abdominal pain)   Volume overload   Scrotal edema   Hypokalemia   Protein-calorie malnutrition, severe   Loss of appetite   Adjustment disorder with mixed anxiety and depressed mood    Time spent: 35 mins  Verneita Griffes, MD [facility] (P) [phone]

## 2014-05-19 NOTE — Care Management (Signed)
CARE MANAGEMENT NOTE 05/19/2014  Patient:  Frank [name], Frank [name]   Account Number:  [id number]  Date Initiated:  05/15/2014  Documentation initiated by:  Karl Bales  Subjective/Objective Assessment:   pt admitted with cco perforation of colon     Action/Plan:   from home   Anticipated DC Date:  05/18/2014   Anticipated DC Plan:  [place]  CM consult      [facility] Choice  HOSPICE   Choice offered to / List presented to:  C-1 Patient        [place] arranged  HH-1 RN      [place]   Status of service:  In process, will continue to follow Medicare Important Message given?  YES (If response is "NO", the following Medicare IM given date fields will be blank) Date Medicare IM given:  05/15/2014 Medicare IM given by:  Karl Bales Date Additional Medicare IM given:   Additional Medicare IM given by:    Discharge Disposition:    Per UR Regulation:  Reviewed for med. necessity/level of care/duration of stay  If discussed at [place] of Stay Meetings, dates discussed:   05/17/2014  05/15/2014    Comments:  05/19/14 1148 - CM spoke with patient and daughter-in-law, Frank [name]. Patient has not been seen by hospice. Daughter-in-law states patient's girlfriend is out of town until Monday or Tuesday. States they need to rearrange furniture (patient's room is upstairs) and will need equipment for him to return home. Patient will need a hospital bed, walker and wheelchair. Frank [name] states they have been told Medicare will not pay for him to remain inpatient until Tuesday. She agrees to do whatever needs to be done for his discharge. CM contacted [facility] to follow-up on the referral. Maudie Mercury will have the on-call RN call CM back. Kim called back and states they will be able to come to complete an evaluation today.  Venita Sheffield RN BSN CCM [phone]  05/18/14 MMcGibboney, RN, BSN Spoke with pt and son  Frank [name] at bedside concerning discharge.  [facility] referral called to in house rep [phone]. Pt  states, " I want to stay until Tues, that's when Frank [name] ( pt's girlfriend) will be back home".   Explained to pt and son, when discharge orders are written,  he can not stay in the hospital until his girlfriend returns home. Other arrangement will need to be made. Also asked son to call Frank [name] to let her know pt was ready for discharge.  05/17/14 MMcGibboney, RN, BSN Spoke with son Frank [name] [phone], concerning pt's discharge plan. Plan to wait until after conference with Dr. [name].  05/16/14 MMcGibboney, RN, BSN Spoke with pt today and son at bedside, he selected [facility].   05/15/14 MMcGibboney, RN, BSN Spoke with pt and girlfriend at bedside pt want to go home with [facility]. Will check back in am for agency.

## 2014-05-19 NOTE — Progress Notes (Signed)
16 Days Post-Op  Subjective: No complaints  Objective: Vital signs in last 24 hours: Temp:  [97.7 F (36.5 C)-98.6 F (37 C)] 97.7 F (36.5 C) (01/09 0610) Pulse Rate:  [65-95] 81 (01/09 0610) Resp:  [18-20] 18 (01/09 0610) BP: (122-135)/(63-72) 126/68 mmHg (01/09 0610) SpO2:  [93 %-97 %] 96 % (01/09 0610) Weight:  [193 lb 14.4 oz (87.952 kg)] 193 lb 14.4 oz (87.952 kg) (01/09 0622) Last BM Date: 05/19/14  Intake/Output from previous day: 01/08 0701 - 01/09 0700 In: 750 [P.O.:750] Out: 2475 [Urine:2475] Intake/Output this shift:    GI: unchanged  Lab Results:   Recent Labs  05/18/14 0420 05/19/14 0418  WBC 18.0* 16.7*  HGB 9.6* 9.2*  HCT 29.0* 27.3*  PLT 103* 97*   BMET  Recent Labs  05/18/14 0420 05/19/14 0418  NA 136 137  K 3.2* 2.8*  CL 102 102  CO2 25 27  GLUCOSE 110* 121*  BUN 16 17  CREATININE 0.89 0.68  CALCIUM 7.2* 7.0*   PT/INR  Recent Labs  05/17/14 0830 05/18/14 0420  LABPROT 20.2* 20.4*  INR 1.70* 1.73*   ABG No results for input(s): PHART, HCO3 in the last 72 hours.  Invalid input(s): PCO2, PO2  Studies/Results: No results found.  Anti-infectives: Anti-infectives    Start     Dose/Rate Route Frequency Ordered Stop   05/10/14 2200  levofloxacin (LEVAQUIN) tablet 750 mg  Status:  Discontinued     750 mg Oral Every 24 hours 05/10/14 1727 05/14/14 1845   05/10/14 2200  metroNIDAZOLE (FLAGYL) tablet 500 mg  Status:  Discontinued     500 mg Oral 3 times per day 05/10/14 1727 05/14/14 1845   05/10/14 2000  ceFEPIme (MAXIPIME) 2 g in dextrose 5 % 50 mL IVPB  Status:  Discontinued     2 g100 mL/hr over 30 Minutes Intravenous Every 12 hours 05/10/14 0950 05/10/14 1727   05/03/14 0200  metroNIDAZOLE (FLAGYL) IVPB 500 mg  Status:  Discontinued     500 mg100 mL/hr over 60 Minutes Intravenous Every 8 hours 05/03/14 0018 05/03/14 0018   05/03/14 0100  ciprofloxacin (CIPRO) IVPB 400 mg  Status:  Discontinued     400 mg200 mL/hr over 60  Minutes Intravenous Every 12 hours 05/03/14 0016 05/03/14 0018   05/03/14 0100  metroNIDAZOLE (FLAGYL) IVPB 500 mg  Status:  Discontinued     500 mg100 mL/hr over 60 Minutes Intravenous Every 8 hours 05/03/14 0021 05/10/14 1727   05/03/14 0100  ceFEPIme (MAXIPIME) 2 g in dextrose 5 % 50 mL IVPB  Status:  Discontinued     2 g100 mL/hr over 30 Minutes Intravenous Every 8 hours 05/03/14 0031 05/10/14 0950   05/03/14 0000  metroNIDAZOLE (FLAGYL) IVPB 500 mg  Status:  Discontinued     500 mg100 mL/hr over 60 Minutes Intravenous Every 8 hours 05/02/14 2356 05/03/14 0018      Assessment/Plan: s/p Procedure(s): SIGMOID AND LEFT COLON COLECTOMY, END COLOSTOMY, MOBILIZATION OF SPLENIC FLEXURE (N/A) Pt has elected to go home with hospice care.  Call if we can help  LOS: 17 days    TOTH III,Minta Fair S 05/19/2014

## 2014-05-20 DIAGNOSIS — K631 Perforation of intestine (nontraumatic): Secondary | ICD-10-CM

## 2014-05-20 LAB — TYPE AND SCREEN
ABO/RH(D): B POS
ANTIBODY SCREEN: NEGATIVE
UNIT DIVISION: 0
Unit division: 0
Unit division: 0

## 2014-05-20 NOTE — Progress Notes (Signed)
[facility] PROGRESS NOTE  Frank [name] IJS:[id number] DOB: 30-Dec-1945 DOA: 05/02/2014 PCP: Cassell Clement, MD  Brief history 69 y.o. male with Past medical history of metastatic colon cancer to liver, hypertension, coronary artery disease status post CABG, diastolic dysfunction presented with abdominal pain, nausea, and vomiting with generalized weakness. CT of the abdomen and pelvis on the day of admission revealed sigmoid colonic perforation with diffuse Lahey rim enhancing large volume ascites. General surgery was consulted, and the patient was taken to the operating room where he had a hemicolectomy and end colostomy on 05/03/2014. The patient's diet was gradually advanced as his bowel function returned. Palliative medicine was consulted and the patient was changed to DO NOT RESUSCITATE. Oncology weighed back in on 05/18/14 and have expressed there are slim odd for any further type o fchemotherapy given the burden of his disease and poor ECOG status Patient has elected for comfort care at home with partner.   Assessment/Plan: #1 abdominal pain nausea vomiting secondary to perforated sigmoid colon carcinoma with multiple interloop abscesses and advanced metastatic disease to the liver status post abdominal exploration Colectomy + left colon colostomy 12/24 Surgeon requested to advise re: symtpomatic bleeding if d/c home c Hopsice  #2 volume overload/scrotal edema Likely secondary to aggressive fluid hydration.  Continue Oral lasix PO to comfort  Elevated Alk Phos 1200's, T bili up  Defer to Oncology No obvious reason other than potential Metastatic spread as seen on CT 1/5 Stop checking labs as per discussion c patient and family  #3 left upper extremity edema Doppler ultrasound negative for DVT. Continue Lasix.  #4 Anemia/TCP Secondary to chemotherapy. Counts improving. Patient has been seen by oncology. Patient lost almost 1 L of blood during surgery and received 2 units of  packed red blood cells. Trasnfused again 05/16/13 NO further lab draws  #5 tachycardia Was thought initially to be secondary to pain blood loss and dehydration. Patient in normal sinus rhythm. Continue current regimen of  beta blocker. Normal saline lock IV fluids. Follow.  #6 hypertension Improved. Continue current regimen of Lopressor.  #7 chronic diastolic heart failure/volume overload Patient looks volume overloaded on examination. Patient's current weight 93kg. Patient's weight on admission was approximately 78.6 kg.  Stop checking I/o and labs now as more comfort care  #8 history of coronary artery disease status post CABG Patient with some shortness of breath on ambulation however denies any chest pain. Continue beta blocker.   #9 metastatic colon cancer Outpatient follow-up with oncology.  #10 hypokalemia/hypomagnesemia Likely secondary to diureses. Repleted this am.  #11 goals of care Patient has been seen by palliative care. Patient is currently DO NOT RESUSCITATE.  Heis considering going home with home Hospice once his GF returns from [facility]  #12 prophylaxis PPI for GI prophylaxis. Lovenox for DVT prophylaxis.  Code Status: DO NOT RESUSCITATE Family Communication: Updated patient, no family at bedside. Disposition Plan: Home when medically stable with home health therapies   Consultants:  Palliative care: Dr. Greig Right 05/03/2014  Oncology: Dr. should that 05/03/2014   Gen. surgery: Dr. Daphine Deutscher 05/03/2014  Procedures:  CT abdomen and pelvis 05/02/2014  CT head 05/02/2014  Chest x-ray 05/03/2014  Scrotal ultrasound 05/10/2014  Abdominal exploration with distal left colon and sigmoid colon resection and and left colon colostomy 05/03/2014 per Dr. Ezzard Standing  2 units packed red blood cells  Left upper extremity Doppler ultrasound 05/09/2014  I U PRBC 05/16/14  Antibiotics:  Oral Levaquin 05/10/2014>>> 05/14/2014  Oral Flagyl 05/10/2014>>>>> 05/14/2014  IV  cefepime 05/03/2014>>>>> 05/10/2014  IV ciprofloxacin 05/03/2014>>> 05/03/2014  IV Flagyl 05/03/2014>>>>> 05/10/2014  HPI/Subjective:  Eating Doing fair No c/o Wound looks very clearn without overt bleeding Objective: Filed Vitals:   05/20/14 1541  BP: 120/79  Pulse: 80  Temp: 98.2 F (36.8 C)  Resp: 19    Intake/Output Summary (Last 24 hours) at 05/20/14 1807 Last data filed at 05/20/14 1300  Gross per 24 hour  Intake     10 ml  Output   1650 ml  Net  -1640 ml   Filed Weights   05/18/14 0455 05/19/14 0622 05/20/14 0411  Weight: 89.449 kg (197 lb 3.2 oz) 87.952 kg (193 lb 14.4 oz) 86.728 kg (191 lb 3.2 oz)    Exam:   General: NAD   Cardiovascular: RRR  Respiratory: CTAB. No rhonchi.  Abdomen: Wound not examined  Musculoskeletal: No clubbing or cyanosis. 2-3+ bilateral lower extremity edema improving.  Data Reviewed: Basic Metabolic Panel:  Recent Labs Lab 05/14/14 0545 05/15/14 0500 05/16/14 0600 05/17/14 0507 05/18/14 0420 05/19/14 0418  NA 137 138 141 138 136 137  K 3.3* 3.2* 4.1 3.1* 3.2* 2.8*  CL 110 109 110 106 102 102  CO2 _0 GLUCOSE 131* 140* 109* 138* 110* 121*  BUN _1 CREATININE 0.87 0.86 0.87 0.76 0.89 0.68  CALCIUM 7.3* 7.3* 7.4* 7.4* 7.2* 7.0*  MG 1.8  --  1.6  --   --   --    Liver Function Tests:  Recent Labs Lab 05/16/14 0600 05/17/14 0507 05/18/14 0420  AST 41* 43* 28  ALT _2 ALKPHOS 1298* 1184* 1170*  BILITOT 1.4* 1.4* 1.3*  PROT 4.6* 4.9* 4.8*  ALBUMIN 1.6* 1.9* 1.8*   No results for input(s): LIPASE, AMYLASE in the last 168 hours. No results for input(s): AMMONIA in the last 168 hours. CBC:  Recent Labs Lab 05/15/14 0500 05/16/14 0600 05/17/14 0507 05/18/14 0420 05/19/14 0418  WBC 15.5* 18.0* 18.9* 18.0* 16.7*  NEUTROABS  --   --   --  14.1*  --   HGB 9.4* 7.3* 9.9* 9.6* 9.2*  HCT 28.7* 22.0* 30.0* 29.0* 27.3*  MCV 86.7 86.6 84.7 84.5 85.8  PLT 147* 136* 80* 103*  97*   Cardiac Enzymes: No results for input(s): CKTOTAL, CKMB, CKMBINDEX, TROPONINI in the last 168 hours. BNP (last 3 results) No results for input(s): PROBNP in the last 8760 hours. CBG: No results for input(s): GLUCAP in the last 168 hours.  Recent Results (from the past 240 hour(s))  Clostridium Difficile by PCR     Status: None   Collection Time: 05/15/14  7:35 AM  Result Value Ref Range Status   C difficile by pcr NEGATIVE NEGATIVE Final    Comment: Performed at [facility]     Studies: No results found.  Scheduled Meds: . antiseptic oral rinse  7 mL Mouth Rinse BID  . feeding supplement (RESOURCE BREEZE)  1 Container Oral q12n4p  . furosemide  40 mg Intravenous BID  . metoprolol tartrate  75 mg Oral BID  . miconazole nitrate   Topical BID  . pantoprazole  40 mg Oral Daily  . sodium chloride  10 mL Intravenous Q12H   Continuous Infusions:    Principal Problem:   Perforation of colon cancer s/p colectomy/colostomy 05/03/2014 Active Problems:   Liver masses   Acute blood loss anemia   Malignant neoplasm of colon   Abdominal pain  Dehydration   Mucositis due to antineoplastic therapy   Intractable nausea and vomiting   Acute encephalopathy   Perforated bowel   Palliative care encounter   Essential hypertension   Colon cancer   AP (abdominal pain)   Volume overload   Scrotal edema   Hypokalemia   Protein-calorie malnutrition, severe   Loss of appetite   Adjustment disorder with mixed anxiety and depressed mood    Time spent: 10 mins  Verneita Griffes, MD [facility] [phone]

## 2014-05-20 NOTE — Progress Notes (Signed)
CARE MANAGEMENT NOTE 05/20/2014  Patient:  Frank [name], Frank [name]   Account Number:  [id number]  Date Initiated:  05/15/2014  Documentation initiated by:  Karl Bales  Subjective/Objective Assessment:   pt admitted with cco perforation of colon     Action/Plan:   from home   Anticipated DC Date:  05/18/2014   Anticipated DC Plan:  [place]  CM consult      [facility] Choice  HOSPICE   Choice offered to / List presented to:  C-1 Patient        [place] arranged  HH-1 RN      [place]   Status of service:  Completed, signed off Medicare Important Message given?  YES (If response is "NO", the following Medicare IM given date fields will be blank) Date Medicare IM given:  05/15/2014 Medicare IM given by:  Karl Bales Date Additional Medicare IM given:   Additional Medicare IM given by:    Discharge Disposition:  [place]  Per UR Regulation:  Reviewed for med. necessity/level of care/duration of stay  If discussed at [place] of Stay Meetings, dates discussed:   05/17/2014  05/15/2014    Comments:  05/20/2014 1630 [facility] will admit to service on Tuesday after dc from [place] has arranged with son, [name] and dtr-in-law a time to deliver DME on 05/21/2014. Jonnie Finner RN CCM Case Mgmt phone [phone]  05/19/14 1148 - CM spoke with patient and daughter-in-law, Claiborne Billings. Patient has not been seen by hospice. Daughter-in-law states patient's girlfriend is out of town until Monday or Tuesday. States they need to rearrange furniture, patient's room is upstairs and will need equipment for him to return home. Patient will need a hospital bed, walker and wheelchair. Claiborne Billings states they have been told Medicare will not pay for him to remain inpatient until Tuesday. She agrees to do whatever needs to be done for his discharge. CM contacted [facility]  to follow-up on the referral. Maudie Mercury will have the on-call RN call CM back. Kim called back and states they will be able to come to complete an evaluation today.  Venita Sheffield RN BSN CCM [phone]  05/18/14 MMcGibboney, RN, BSN Spoke with pt and son [name] at bedside concerning discharge.  [facility] referral called to in house rep [phone]. Pt  states, " I want to stay until Tues, that's when [name] ( pt's girlfriend) will be back home".   Explained to pt and son, when discharge orders are written,  he can not stay in the hospital until his girlfriend returns home. Other arrangement will need to be made. Also asked son to call [name] to let her know pt was ready for discharge.  05/17/14 MMcGibboney, RN, BSN Spoke with son [name] [phone], concerning pt's discharge plan. Plan to wait until after conference with Dr. [name].  05/16/14 MMcGibboney, RN, BSN Spoke with pt today and son at bedside, he selected [facility].   05/15/14 MMcGibboney, RN, BSN Spoke with pt and girlfriend at bedside pt want to go home with [facility]. Will check back in am for agency.

## 2014-05-20 NOTE — Progress Notes (Signed)
Met with Frank [name] and his DIL today.  Symptoms controlled. Appetite improved.  Plan is for him to go [facility] with [facility]. They are supposed to eval today.  I again encouraged them to call me if any questions or concerns.    Doran Clay D.O. Palliative Medicine Team at [facility]  Pager: [phone] Team Phone: [phone]

## 2014-05-21 MED ORDER — OXYCODONE-ACETAMINOPHEN 5-325 MG PO TABS: 1.0000 | ORAL_TABLET | ORAL | Status: AC | PRN

## 2014-05-21 MED ORDER — FUROSEMIDE 40 MG PO TABS: 40.0000 mg | ORAL_TABLET | Freq: Every day | ORAL | Status: AC

## 2014-05-21 MED ORDER — SILVER NITRATE-POT NITRATE 75-25 % EX MISC: 10.0000 | CUTANEOUS | Status: AC | PRN

## 2014-05-21 NOTE — Progress Notes (Signed)
Pt picked up by [facility], pt has already received discharge paperwork and has had all questions answered by day shift RN. Pt's condition stable.

## 2014-05-21 NOTE — Consult Note (Signed)
WOC ostomy follow up Stoma type/location: LLQ Colostomy Stomal assessment/size: 1 and 5/8 inches round, dry, pink Peristomal assessment: intact, clear.  Outside of pouching system at 5 o'clock there is a small patch of erythema measuring 2cm x 3cm. Treatment options for stomal/peristomal skin: skin barrier ring Output soft brown stool Ostomy pouching: 2pc., 2 and 1/4 inch with skin barrier ring Education provided: patient instructed on revised change schedule in light of today's change (vs tomorrow) to Monday/Thursday.  Supplies at bedside for transfer.  Noted that significant other is not due home until tomorrow. Enrolled patient in [facility] Start Discharge program: Yes Thanks for allowing me to care for this nice gentleman. Maudie Flakes, MSN, RN, [place], [place], [place] [phone])

## 2014-05-21 NOTE — Progress Notes (Signed)
Patient ID: Frank [name], male   DOB: [birth year], 69 y.o.   MRN: [id number] 18 Days Post-Op  Subjective: Pt feels ok today.  Some nausea, but ate pancakes for breakfast.  Objective: Vital signs in last 24 hours: Temp:  [98.2 F (36.8 C)-99.5 F (37.5 C)] 99.5 F (37.5 C) (01/11 0600) Pulse Rate:  [61-80] 66 (01/11 0600) Resp:  [19-20] 20 (01/11 0600) BP: (120-148)/(53-79) 148/56 mmHg (01/11 0600) SpO2:  [97 %-100 %] 97 % (01/11 0600) Last BM Date: 05/20/14  Intake/Output from previous day: 01/10 0701 - 01/11 0700 In: 250 [P.O.:240; I.V.:10] Out: 1550 [Urine:1550] Intake/Output this shift:    PE: Abd: soft, midline wound is clean with only minimal fibrinous tissue noted and very inferior aspect of wound.  No bleeding  Lab Results:   Recent Labs  05/19/14 0418  WBC 16.7*  HGB 9.2*  HCT 27.3*  PLT 97*   BMET  Recent Labs  05/19/14 0418  NA 137  K 2.8*  CL 102  CO2 27  GLUCOSE 121*  BUN 17  CREATININE 0.68  CALCIUM 7.0*   PT/INR No results for input(s): LABPROT, INR in the last 72 hours. CMP     Component Value Date/Time   NA 137 05/19/2014 0418   NA 136 05/02/2014 1511   K 2.8* 05/19/2014 0418   K 4.0 05/02/2014 1511   CL 102 05/19/2014 0418   CO2 27 05/19/2014 0418   CO2 22 05/02/2014 1511   GLUCOSE 121* 05/19/2014 0418   GLUCOSE 145* 05/02/2014 1511   BUN 17 05/19/2014 0418   BUN 23.7 05/02/2014 1511   CREATININE 0.68 05/19/2014 0418   CREATININE 1.0 05/02/2014 1511   CALCIUM 7.0* 05/19/2014 0418   CALCIUM 8.4 05/02/2014 1511   PROT 4.8* 05/18/2014 0420   PROT 5.6* 05/02/2014 1511   ALBUMIN 1.8* 05/18/2014 0420   ALBUMIN 2.0* 05/02/2014 1511   AST 28 05/18/2014 0420   AST 9 05/02/2014 1511   ALT 14 05/18/2014 0420   ALT 9 05/02/2014 1511   ALKPHOS 1170* 05/18/2014 0420   ALKPHOS 104 05/02/2014 1511   BILITOT 1.3* 05/18/2014 0420   BILITOT 0.79 05/02/2014 1511   GFRNONAA >90 05/19/2014 0418   GFRAA >90 05/19/2014 0418   Lipase      Component Value Date/Time   LIPASE <10* 05/03/2014 0130       Studies/Results: No results found.  Anti-infectives: Anti-infectives    Start     Dose/Rate Route Frequency Ordered Stop   05/10/14 2200  levofloxacin (LEVAQUIN) tablet 750 mg  Status:  Discontinued     750 mg Oral Every 24 hours 05/10/14 1727 05/14/14 1845   05/10/14 2200  metroNIDAZOLE (FLAGYL) tablet 500 mg  Status:  Discontinued     500 mg Oral 3 times per day 05/10/14 1727 05/14/14 1845   05/10/14 2000  ceFEPIme (MAXIPIME) 2 g in dextrose 5 % 50 mL IVPB  Status:  Discontinued     2 g100 mL/hr over 30 Minutes Intravenous Every 12 hours 05/10/14 0950 05/10/14 1727   05/03/14 0200  metroNIDAZOLE (FLAGYL) IVPB 500 mg  Status:  Discontinued     500 mg100 mL/hr over 60 Minutes Intravenous Every 8 hours 05/03/14 0018 05/03/14 0018   05/03/14 0100  ciprofloxacin (CIPRO) IVPB 400 mg  Status:  Discontinued     400 mg200 mL/hr over 60 Minutes Intravenous Every 12 hours 05/03/14 0016 05/03/14 0018   05/03/14 0100  metroNIDAZOLE (FLAGYL) IVPB 500 mg  Status:  Discontinued  500 mg100 mL/hr over 60 Minutes Intravenous Every 8 hours 05/03/14 0021 05/10/14 1727   05/03/14 0100  ceFEPIme (MAXIPIME) 2 g in dextrose 5 % 50 mL IVPB  Status:  Discontinued     2 g100 mL/hr over 30 Minutes Intravenous Every 8 hours 05/03/14 0031 05/10/14 0950   05/03/14 0000  metroNIDAZOLE (FLAGYL) IVPB 500 mg  Status:  Discontinued     500 mg100 mL/hr over 60 Minutes Intravenous Every 8 hours 05/02/14 2356 05/03/14 0018       Assessment/Plan   1. 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 Has been on active chemotx supervised by Dr. Wanda Plump. [name]. 2. . SIGMOID AND LEFT COLON COLECTOMY, END COLOSTOMY, MOBILIZATION OF SPLENIC FLEXURE - 05/03/2014 - D. Newman For perforated sigmoid colon at site of colon cancer -  3.  History of CAD/chronic fluid overload  4. leukocytosis of uncertain etiology 5. Malnutrition 6. HTN 7. Anemia, acute and chronic - Hgb - 9.9 - post transfusion 8. Volume overload 9. Hypokalemia being treated 10. Elevated LFt's.  Plan: 1. Plan is for home with hospice.  No further bleeding from his wound.  Doubt this will be an issue.  If so, pressure can be applied or hospice/our office can be called. 2. Follow up with Dr. Lucia Gaskins for palliative wound management and ostomy care. 3. Please call with further questions.  We will sign off.  LOS: 19 days    [name] 05/21/2014, 9:44 AM Pager: [phone]

## 2014-05-21 NOTE — Progress Notes (Signed)
PT Cancellation Note  Patient Details Name: Frank [name] MRN: [id number] DOB: November 22, 1945   Cancelled Treatment:     pt plans to D/C to home today under Hospice.   Rica Koyanagi  PTA [facility]  Acute  Rehab Pager      [phone]

## 2014-05-21 NOTE — Discharge Instructions (Signed)
Dressing Change A dressing is a material placed over wounds. It keeps the wound clean, dry, and protected from further injury. This provides an environment that favors wound healing.  BEFORE YOU BEGIN  Get your supplies together. Things you may need include:  Saline solution.  Flexible gauze dressing.  Medicated cream.  Tape.  Gloves.  Abdominal dressing pads.  Gauze squares.  Plastic bags.  Take pain medicine 30 minutes before the dressing change if you need it.  Take a shower before you do the first dressing change of the day. Use plastic wrap or a plastic bag to prevent the dressing from getting wet. REMOVING YOUR OLD DRESSING   Wash your hands with soap and water. Dry your hands with a clean towel.  Put on your gloves.  Remove any tape.  Carefully remove the old dressing. If the dressing sticks, you may dampen it with warm water to loosen it, or follow your caregiver's specific directions.  Remove any gauze or packing tape that is in your wound.  Take off your gloves.  Put the gloves, tape, gauze, or any packing tape into a plastic bag. CHANGING YOUR DRESSING  Open the supplies.  Take the cap off the saline solution.  Open the gauze package so that the gauze remains on the inside of the package.  Put on your gloves.  Clean your wound as told by your caregiver.  If you have been told to keep your wound dry, follow those instructions.  Your caregiver may tell you to do one or more of the following:  Pick up the gauze. Pour the saline solution over the gauze. Squeeze out the extra saline solution.  Put medicated cream or other medicine on your wound if you have been told to do so.  Put the solution soaked gauze only in your wound, not on the skin around it.  Pack your wound loosely or as told by your caregiver.  Put dry gauze on your wound.  Put abdominal dressing pads over the dry gauze if your wet gauze soaks through.  Tape the abdominal dressing  pads in place so they will not fall off. Do not wrap the tape completely around the affected part (arm, leg, abdomen).  Wrap the dressing pads with a flexible gauze dressing to secure it in place.  Take off your gloves. Put them in the plastic bag with the old dressing. Tie the bag shut and throw it away.  Keep the dressing clean and dry until your next dressing change.  Wash your hands. SEEK MEDICAL CARE IF:  Your skin around the wound looks red.  Your wound feels more tender or sore.  You see pus in the wound.  Your wound smells bad.  You have a fever.  Your skin around the wound has a rash that itches and burns.  You see black or yellow skin in your wound that was not there before.  You feel nauseous, throw up, and feel very tired. Document Released: 06/04/2004 Document Revised: 07/20/2011 Document Reviewed: 03/09/2011 [facility] Patient Information 2015 [place], [place]. This information is not intended to replace advice given to you by your health care provider. Make sure you discuss any questions you have with your health care provider.   Colostomy Home Guide A colostomy is an opening for stool to leave your body when a medical condition prevents it from leaving through the usual opening (rectum). During a surgery, a piece of large intestine (colon) is brought through a hole in the abdominal  wall. The new opening is called a stoma or ostomy. A bag or pouch fits over the stoma to catch stool and gas. Your stool may be liquid, somewhat pasty, or formed. CARING FOR YOUR STOMA  Normally, the stoma looks a lot like the inside of your cheek: pink, red, and moist. At first it may be swollen, but this swelling will decrease within 6 weeks. Keep the skin around your stoma clean and dry. You can gently wash your stoma and the skin around your stoma in the shower with a clean, soft washcloth. If you develop any skin irritation, your caregiver may give you a stoma powder or ointment to help  heal the area. Do not use any products other than those specifically given to you by your caregiver.  Your stoma should not be uncomfortable. If you notice any stinging or burning, your pouch may be leaking, and the skin around your stoma may be coming into contact with stool. This can cause skin irritation. If you notice stinging, replace your pouch with a new one and discard the old one. OSTOMY POUCHES  The pouch that fits over the ostomy can be made up of either 1 or 2 pieces. A one-piece pouch has a skin barrier piece and the pouch itself in one unit. A two-piece pouch has a skin barrier with a separate pouch that snaps on and off of the skin barrier. Either way, you should empty the pouch when it is only  to  full. Do not let more stool or gas build up. This could cause the pouch to leak. Some ostomy bags have a built-in gas release valve. Ostomy deodorizer (5 drops) can be put into the pouch to prevent odor. Some people use ostomy lubricant drops inside the pouch to help the stool slide out of the bag more easily and completely.  EMPTYING YOUR OSTOMY POUCH  You may get lessons on how to empty your pouch from a wound-ostomy nurse before you leave the hospital. Here are the basic steps:  Wash your hands with soap and water.  Sit far back on the toilet.  Put several pieces of toilet paper into the toilet water. This will prevent splashing as you empty the stool into the toilet bowl.  Unclip or unvelcro the tail end of the pouch.  Unroll the tail and empty stool into the toilet.  Clean the tail with toilet paper.  Reroll the tail, and clip or velcro it closed.  Wash your hands again. CHANGING YOUR OSTOMY POUCH  Change your ostomy pouch about every 3 to 4 days for the first 6 weeks, then every 5 to7 days. Always change the bag sooner if there is any leakage or you begin to notice any discomfort or irritation of the skin around the stoma. When possible, plan to change your ostomy pouch  before eating or drinking as this will lessen the chance of stool coming out during the pouch change. A wound-ostomy nurse may teach you how to change your pouch before you leave the hospital. Here are the basic steps:  Lay out your supplies.  Wash your hands with soap and water.  Carefully remove the old pouch.  Wash the stoma and allow it to dry. Men may be advised to shave any hair around the stoma very carefully. This will make the adhesive stick better.  Use the stoma measuring guide that comes with your pouch set to decide what size hole you will need to cut in the skin barrier piece.  Choose the smallest possible size that will hold the stoma but will not touch it.  Use the guide to trace the circle on the back of the skin barrier piece. Cut out the hole.  Hold the skin barrier piece over the stoma to make sure the hole is the correct size.  Remove the adhesive paper backing from the skin barrier piece.  Squeeze stoma paste around the opening of the skin barrier piece.  Clean and dry the skin around the stoma again.  Carefully fit the skin barrier piece over your stoma.  If you are using a two-piece pouch, snap the pouch onto the skin barrier piece.  Close the tail of the pouch.  Put your hand over the top of the skin barrier piece to help warm it for about 5 minutes, so that it conforms to your body better.  Wash your hands again. DIET TIPS   Continue to follow your usual diet.  Drink about eight 8 oz glasses of water each day.  You can prevent gas by eating slowly and chewing your food thoroughly.  If you feel concerned that you have too much gas, you can cut back on gas-producing foods, such as:  Spicy foods.  Onions and garlic.  Cruciferous vegetables (cabbage, broccoli, cauliflower, Brussels sprouts).  Beans and legumes.  Some cheeses.  Eggs.  Fish.  Bubbly (carbonated) drinks.  Chewing gum. GENERAL TIPS   You can shower with or without the bag  in place.  Always keep the bag on if you are bathing or swimming.  If your bag gets wet, you can dry it with a blow-dryer set to cool.  Avoid wearing tight clothing directly over your stoma so that it does not become irritated or bleed. Tight clothing can also prevent stool from draining into the pouch.  It is helpful to always have an extra skin barrier and pouch with you when traveling. Do not leave them anywhere too warm, as parts of them can melt.  Do not let your seat belt rest on your stoma. Try to keep the seat belt either above or below your stoma, or use a tiny pillow to cushion it.  You can still participate in sports, but you should avoid activities in which there is a risk of getting hit in the abdomen.  You can still have sex. It is a good idea to empty your pouch prior to sex. Some people and their partners feel very comfortable seeing the pouch during sex. Others choose to wear lingerie or a T-shirt that covers the device. SEEK IMMEDIATE MEDICAL CARE IF:  You notice a change in the size or color of the stoma, especially if it becomes very red, purple, black, or pale white.  You have bloody stools or bleeding from the stoma.  You have abdominal pain, nausea, vomiting, or bloating.  There is anything unusual protruding from the stoma.  You have irritation or red skin around the stoma.  No stool is passing from the stoma.  You have diarrhea (requiring more frequent than normal pouch emptying). Document Released: 04/30/2003 Document Revised: 07/20/2011 Document Reviewed: 09/24/2010 [facility] Patient Information 2015 [place], [place]. This information is not intended to replace advice given to you by your health care provider. Make sure you discuss any questions you have with your health care provider.

## 2014-05-21 NOTE — Discharge Summary (Signed)
Physician Discharge Summary  ALLISON SILVA JQB:[id number] DOB: 1945-09-12 DOA: 05/02/2014  PCP: [name], MD  Admit date: 05/02/2014 Discharge date: 05/21/2014  Time spent: 32 minutes  Recommendations for Outpatient Follow-up:  1. Comfort care at home 2. Palliative wound management per Dr. Lucia Gaskins Gen surgery 3. Opiates and silver nitrate rx on d/c home -use silver nitrate or pack abdomen if he bleeds 4. Wound care-Wounds in abd to be moistened for 5 min c saline prior to wet to dry dressing changes   Discharge Diagnoses:  Principal Problem:   Perforation of colon cancer s/p colectomy/colostomy 05/03/2014 Active Problems:   Liver masses   Acute blood loss anemia   Malignant neoplasm of colon   Abdominal pain   Dehydration   Mucositis due to antineoplastic therapy   Intractable nausea and vomiting   Acute encephalopathy   Perforated bowel   Palliative care encounter   Essential hypertension   Colon cancer   AP (abdominal pain)   Volume overload   Scrotal edema   Hypokalemia   Protein-calorie malnutrition, severe   Loss of appetite   Adjustment disorder with mixed anxiety and depressed mood   Discharge Condition: gaurded-imminenet demise expected  Diet recommendation: comfort  Filed Weights   05/18/14 0455 05/19/14 0622 05/20/14 0411  Weight: 89.449 kg (197 lb 3.2 oz) 87.952 kg (193 lb 14.4 oz) 86.728 kg (191 lb 3.2 oz)    Brief history 69 y.o. male with Past medical history of metastatic colon cancer to liver, hypertension, coronary artery disease status post CABG, diastolic dysfunction presented with abdominal pain, nausea, and vomiting with generalized weakness. CT of the abdomen and pelvis on the day of admission revealed sigmoid colonic perforation with diffuse Lahey rim enhancing large volume ascites. General surgery was consulted, and the patient was taken to the operating room where he had a hemicolectomy and end colostomy on 05/03/2014. The patient's  diet was gradually advanced as his bowel function returned. Palliative medicine was consulted and the patient was changed to DO NOT RESUSCITATE. Oncology weighed back in on 05/18/14 and have expressed there are slim odd for any further type o fchemotherapy given the burden of his disease and poor ECOG status Patient has elected for comfort care at home with partner.    #1 abdominal pain nausea vomiting secondary to perforated sigmoid colon carcinoma with multiple interloop abscesses and advanced metastatic disease to the liver status post abdominal exploration Colectomy + left colon colostomy 12/24 Surgeon requested to advise re: symtpomatic bleeding if d/c home c Hopsice #2 volume overload/scrotal edema Likely secondary to aggressive fluid hydration.  Continue Oral lasix PO to comfort Elevated Alk Phos 1200's, T bili up  Defer to Oncology No obvious reason other than potential Metastatic spread as seen on CT 1/5 Stop checking labs as per discussion c patient and family zofran ODT for N/v #3 left upper extremity edema Doppler ultrasound negative for DVT. Continue Lasix. #4 Anemia/TCP Secondary to chemotherapy. Counts improving. Patient has been seen by oncology. Patient lost almost 1 L of blood during surgery and received 2 units of packed red blood cells. Trasnfused again 05/16/13 NO further lab draws #5 tachycardia Was thought initially to be secondary to pain blood loss and dehydration. Patient in normal sinus rhythm. Continue current regimen of  beta blocker. Normal saline lock IV fluids. Follow. #6 hypertension Improved. Continue current regimen of Lopressor. #7 chronic diastolic heart failure/volume overload Volume overloaded on examination. Patient's current weight 93kg. Patient's weight on admission was approximately 78.6  kg.  Stop checking I/o and labs now as more comfort care Lasix 40 po daily and extra dosing to comfort #8 history of coronary artery disease status post  CABG Patient with some shortness of breath on ambulation however denies any chest pain. Continue beta blocker.  #9 metastatic colon cancer Outpatient follow-up with oncology. #10 hypokalemia/hypomagnesemia Likely secondary to diureses. Repleted this am. #11 goals of care Patient has been seen by palliative care. Patient is currently DO NOT RESUSCITATE.   Code Status: DO NOT RESUSCITATE, comfort c home hospice   Consultants:  Palliative care: Dr. [name] 05/03/2014  Oncology: Dr. should that 05/03/2014   Gen. surgery: Dr. Hassell Done 05/03/2014  Procedures:  CT abdomen and pelvis 05/02/2014  CT head 05/02/2014  Chest x-ray 05/03/2014  Scrotal ultrasound 05/10/2014  Abdominal exploration with distal left colon and sigmoid colon resection and and left colon colostomy 05/03/2014 per Dr. Lucia Gaskins  2 units packed red blood cells  Left upper extremity Doppler ultrasound 05/09/2014  I U PRBC 05/16/14  Antibiotics:  Oral Levaquin 05/10/2014>>> 05/14/2014  Oral Flagyl 05/10/2014>>>>> 05/14/2014  IV cefepime 05/03/2014>>>>> 05/10/2014  IV ciprofloxacin 05/03/2014>>> 05/03/2014  IV Flagyl 05/03/2014>>>>> 05/10/2014    Discharge Exam: Filed Vitals:   05/21/14 0600  BP: 148/56  Pulse: 66  Temp: 99.5 F (37.5 C)  Resp: 20   Alert pleasantoriented, in NAD General: nad Cardiovascular: s1 s 2no m/r/g Respiratory: Coloscomty stable  Discharge Instructions   Discharge Instructions    Diet - low sodium heart healthy    Complete by:  As directed      Discharge instructions    Complete by:  As directed   Patient to d/c home with home hospice     Increase activity slowly    Complete by:  As directed           Current Discharge Medication List    START taking these medications   Details  oxyCODONE-acetaminophen (PERCOCET/ROXICET) 5-325 MG per tablet Take 1-2 tablets by mouth every 3 (three) hours as needed for moderate pain. Qty: 30 tablet, Refills: 0    silver  nitrate applicators 35-68 % applicator Apply 10 Sticks topically as needed for bleeding. Qty: 100 each, Refills: 0      CONTINUE these medications which have CHANGED   Details  furosemide (LASIX) 40 MG tablet Take 1 tablet (40 mg total) by mouth daily. Qty: 30 tablet, Refills: 5      CONTINUE these medications which have NOT CHANGED   Details  atenolol (TENORMIN) 25 MG tablet take 1/2 tablet by mouth twice a day Qty: 90 tablet, Refills: 0    docusate sodium (COLACE) 100 MG capsule Take 200 mg by mouth daily as needed.     LORazepam (ATIVAN) 0.5 MG tablet Take 1 tablet (0.5 mg total) by mouth every 4 (four) hours as needed (nausea). Qty: 30 tablet, Refills: 0    polyvinyl alcohol (LIQUIFILM TEARS) 1.4 % ophthalmic solution Place 1-2 drops into both eyes daily as needed for dry eyes.    ondansetron (ZOFRAN ODT) 8 MG disintegrating tablet Take 1 tablet (8 mg total) by mouth every 8 (eight) hours as needed for nausea or vomiting. Qty: 20 tablet, Refills: 0      STOP taking these medications     lidocaine-prilocaine (EMLA) cream      Multiple Vitamin (MULTIVITAMIN WITH MINERALS) TABS tablet      OVER THE COUNTER MEDICATION      oxyCODONE-acetaminophen (PERCOCET) 7.5-325 MG per tablet  potassium chloride SA (K-DUR,KLOR-CON) 20 MEQ tablet      vitamin C (ASCORBIC ACID) 500 MG tablet      potassium chloride SA (K-DUR,KLOR-CON) 20 MEQ tablet      prochlorperazine (COMPAZINE) 10 MG tablet      tadalafil (CIALIS) 20 MG tablet        Allergies  Allergen Reactions  . Simvastatin Other (See Comments)    "Muscle aches"   Follow-up Information    Follow up with [place] [facility].   Why:  please contact when you arrive home   Contact information:   [address] [place] [address] [phone]       Follow up with [facility], MD. Schedule an appointment as soon as possible for a visit in 4 weeks.   Specialty:  General Surgery   Why:  For wound  re-check   Contact information:   [place] [id number] [phone]        The results of significant diagnostics from this hospitalization (including imaging, microbiology, ancillary and laboratory) are listed below for reference.    Significant Diagnostic Studies: Ct Head Wo Contrast  05/02/2014   CLINICAL DATA:  Increased confusion today with nausea and vomiting  EXAM: CT HEAD WITHOUT CONTRAST  TECHNIQUE: Contiguous axial images were obtained from the base of the skull through the vertex without intravenous contrast.  COMPARISON:  None  FINDINGS: Advanced atrophy with prominence of the bifrontal extra-axial spaces. Gray-white differentiation is maintained. No CT evidence of acute large territory infarct. No intraparenchymal extra-axial mass or hemorrhage. Unchanged size and configuration of the ventricles and basilar cisterns. No midline shift.  Limited visualization of the paranasal sinuses and mastoid air cells are normal. No air-fluid level. There is potential enlargement of the sella (image 9, series 3).  Regional soft tissues appear normal. Paranasal sinuses and mastoid air cells are normal. No displaced calvarial fracture.  IMPRESSION: 1. Advanced atrophy without acute intracranial process. 2. Nonspecific potential enlargement of the sella. Further evaluation could be performed with nonemergent brain MRI as clinically indicated.   Electronically Signed   By: [name] M.D.   On: 05/02/2014 21:51   US Scrotum  05/11/2014   CLINICAL DATA:  Scrotal edema  EXAM: SCROTAL ULTRASOUND  DOPPLER ULTRASOUND OF THE TESTICLES  TECHNIQUE: Complete ultrasound examination of the testicles, epididymis, and other scrotal structures was performed. Color and spectral Doppler ultrasound were also utilized to evaluate blood flow to the testicles.  COMPARISON:  None.  FINDINGS: Right testicle  Measurements: 2.9 x 2.1 x 2.3 cm. Two coarse calcifications noted. A curvilinear hypoechoic  structure at the lower pole on the sagittal projection is likely a vein. No mass was seen on transverse imaging and no mass was seen during real-time sonographer scan.  Left testicle  Measurements: 2.8 x 2.4 x 2.2 cm. No mass or microlithiasis visualized.  Right epididymis: 9 mm cyst in the right epididymis, incidental. No enlargement or abnormal vascularity.  Left epididymis:  No enlargement or abnormal vascularity.  Hydrocele:  Small and simple bilateral hydrocele.  Varicocele:  None visualized.  Pulsed Doppler interrogation of both testes demonstrates low resistance arterial and venous waveforms bilaterally.  There is herniated fat herniation the left inguinal canal which is known based on previous CT.  The scrotal wall is diffusely edematous and thickened. There is no bright spectral reflectors suggestive of emphysema. No evidence of abscess.  IMPRESSION: 1. Marked scrotal wall edema without abscess. 2.  No acute intrascrotal finding.   Electronically Signed   By: Jorje Guild M.D.   On: 05/11/2014 01:35   Ct Abdomen Pelvis W Contrast  05/15/2014   CLINICAL DATA:  Perforation of colon.  EXAM: CT ABDOMEN AND PELVIS WITH CONTRAST  TECHNIQUE: Multidetector CT imaging of the abdomen and pelvis was performed using the standard protocol following bolus administration of intravenous contrast.  CONTRAST:  124m OMNIPAQUE IOHEXOL 300 MG/ML  SOLN  COMPARISON:  CT scan of May 02, 2014.  FINDINGS: Severe degenerative disc disease is noted at L5-S1. Moderate bilateral pleural effusions are noted with adjacent subsegmental atelectasis.  Mild anasarca is noted which is increased compared to prior exam. Status post left colon resection with colostomy seen in left lower quadrant. Mild ascites is noted. There is no evidence of bowel obstruction. Cholelithiasis is again noted. Multiple hepatic masses are noted consistent with metastatic disease. These are unchanged compared to prior exam. Adrenal glands and kidneys appear  normal. No hydronephrosis or renal obstruction is noted. Atherosclerotic calcifications of abdominal aorta are noted without aneurysm formation. Urinary bladder is decompressed. Small fat containing left inguinal hernia is noted. No definite abscess is seen at this time. Mild pneumoperitoneum is noted in the epigastric region most consistent with recent surgery. 18 x 11 mm retroperitoneal lymph node is noted on image number 28 which is unchanged compared to prior exam.  IMPRESSION: Status post partial left colectomy with colostomy seen in left lower quadrant. No abscess is noted at this time. Mild pneumoperitoneum is noted in the epigastric region consistent with recent surgery.  Moderate bilateral pleural effusions are noted with adjacent subsegmental atelectasis.  Stable multiple hepatic metastatic lesions compared to prior exam. Stable enlarged retroperitoneal lymph node.  New mild ascites is noted in the abdomen.  Mild anasarca is noted.  Cholelithiasis is again noted.   Electronically Signed   By: JSabino DickM.D.   On: 05/15/2014 16:23   Ct Abdomen Pelvis W Contrast  05/02/2014   CLINICAL DATA:  Mid abdominal pain, nausea and vomiting, after chemotherapy. History of colon cancer.  EXAM: CT ABDOMEN AND PELVIS WITH CONTRAST  TECHNIQUE: Multidetector CT imaging of the abdomen and pelvis was performed using the standard protocol following bolus administration of intravenous contrast.  CONTRAST:  103mOMNIPAQUE IOHEXOL 300 MG/ML  SOLN  COMPARISON:  PET-CT 03/07/2014, CT abdomen/ pelvis 02/22/2014  FINDINGS: Evidence of CABG partly visualized. Trace pleural effusions are noted with associated bibasilar linear presumed atelectasis.  A few foci of free intraperitoneal air are identified image 19. Epicardial nodes are identified, 0.6 cm diameter image 12.  Innumerable hepatic masses are reidentified with overall nodular liver contour suggesting underlying cirrhosis. Representative posterior segment right hepatic  lobe mass is slightly smaller, now 5.4 x 4.7 cm image 22, previously 6.8 x 5.8 cm.  Porta hepatis node is stable measuring 0.9 cm image 30. Stable subcentimeter periaortic lymph nodes.  There are numerous loops of small bowel with diameter at upper limits of normal, representative 3.1 cm diameter loop of bowel within the left upper quadrant image 32, but which demonstrate abnormal wall thickening. Within the colon, the previously seen area of masslike sigmoid wall thickening is reidentified, image 71. There is a new rim enhancing gas/ fluid collection compatible with abscess from colonic perforation image 70, measuring 3.5 x 3.3 cm. Foci of gas are noted tracking throughout the mesentery as well. Large volume ascites is noted with interval development of rim enhancement suggesting superimposed infection, for example image  72. Pericolonic nodes are reidentified, representative 0.6 cm node image 64. There is increased pericolonic stranding. Increased stranding and fluid infiltration of the mesentery/omentum is noted without measurable nodularity.  Aortic atheromatous calcification without aneurysm. Fat containing left inguinal hernia noted. The bladder is unremarkable.  Evidence of right L5 laminectomy. Multilevel lumbar spine disc degenerative change noted. No focal osseous lesion is identified. The bones are subjectively mildly sclerotic, unchanged, raising the question of possible metastatic infiltration although this could be a normal variant.  IMPRESSION: Interval sigmoid colonic perforation adjacent to the previously seen masslike wall thickening compatible with primary colonic carcinoma and intra-abdominal abscess formation.  Small bowel wall thickening may suggest secondary enteritis. No evidence for small bowel obstruction at this time.  Diffusely rim enhancing large volume ascites and mesenteric infiltration suggesting peritonitis.  Decrease in size of hepatic representative metastatic lesion.  Critical  Value/emergent results were called by telephone at the time of interpretation on 05/02/2014 at 11:38 pm to [facility] for Dr. Berle Mull , who verbally acknowledged these results.   Electronically Signed   By: Conchita Paris M.D.   On: 05/02/2014 23:40   [place] Art/ven Flow Abd Pelv Doppler  05/11/2014   CLINICAL DATA:  Scrotal edema  EXAM: SCROTAL ULTRASOUND  DOPPLER ULTRASOUND OF THE TESTICLES  TECHNIQUE: Complete ultrasound examination of the testicles, epididymis, and other scrotal structures was performed. Color and spectral Doppler ultrasound were also utilized to evaluate blood flow to the testicles.  COMPARISON:  None.  FINDINGS: Right testicle  Measurements: 2.9 x 2.1 x 2.3 cm. Two coarse calcifications noted. A curvilinear hypoechoic structure at the lower pole on the sagittal projection is likely a vein. No mass was seen on transverse imaging and no mass was seen during real-time sonographer scan.  Left testicle  Measurements: 2.8 x 2.4 x 2.2 cm. No mass or microlithiasis visualized.  Right epididymis: 9 mm cyst in the right epididymis, incidental. No enlargement or abnormal vascularity.  Left epididymis:  No enlargement or abnormal vascularity.  Hydrocele:  Small and simple bilateral hydrocele.  Varicocele:  None visualized.  Pulsed Doppler interrogation of both testes demonstrates low resistance arterial and venous waveforms bilaterally.  There is herniated fat herniation the left inguinal canal which is known based on previous CT.  The scrotal wall is diffusely edematous and thickened. There is no bright spectral reflectors suggestive of emphysema. No evidence of abscess.  IMPRESSION: 1. Marked scrotal wall edema without abscess. 2. No acute intrascrotal finding.   Electronically Signed   By: Jorje Guild M.D.   On: 05/11/2014 01:35   Dg Chest Port 1 View  05/03/2014   CLINICAL DATA:  Central line placement.  EXAM: PORTABLE CHEST - 1 VIEW  COMPARISON:  02/22/2014  FINDINGS: Sternotomy wires are  present. Nasogastric tube has tip and side-port over the stomach in the left upper quadrant. Left IJ central venous catheter has tip in the region of the cavoatrial junction. Right IJ central venous catheter has tip over the region of the SVC.  Lungs are hypoinflated with mild linear density in the left base likely atelectasis. There is subtle density in the medial right base likely atelectasis. No definite effusion or pneumothorax. Cardiomediastinal silhouette and remainder of the exam is unchanged.  IMPRESSION: Hypoinflation with mild bibasilar opacification likely atelectasis.  Tubes and lines as described.  No definite pneumothorax.   Electronically Signed   By: Marin Olp M.D.   On: 05/03/2014 18:53    Microbiology: Recent Results (from the  past 240 hour(s))  Clostridium Difficile by PCR     Status: None   Collection Time: 05/15/14  7:35 AM  Result Value Ref Range Status   C difficile by pcr NEGATIVE NEGATIVE Final    Comment: Performed at [facility]: Basic Metabolic Panel:  Recent Labs Lab 05/15/14 0500 05/16/14 0600 05/17/14 0507 05/18/14 0420 05/19/14 0418  NA 138 141 138 136 137  K 3.2* 4.1 3.1* 3.2* 2.8*  CL 109 110 106 102 102  CO2 _0 GLUCOSE 140* 109* 138* 110* 121*  BUN _1 CREATININE 0.86 0.87 0.76 0.89 0.68  CALCIUM 7.3* 7.4* 7.4* 7.2* 7.0*  MG  --  1.6  --   --   --    Liver Function Tests:  Recent Labs Lab 05/16/14 0600 05/17/14 0507 05/18/14 0420  AST 41* 43* 28  ALT _2 ALKPHOS 1298* 1184* 1170*  BILITOT 1.4* 1.4* 1.3*  PROT 4.6* 4.9* 4.8*  ALBUMIN 1.6* 1.9* 1.8*   No results for input(s): LIPASE, AMYLASE in the last 168 hours. No results for input(s): AMMONIA in the last 168 hours. CBC:  Recent Labs Lab 05/15/14 0500 05/16/14 0600 05/17/14 0507 05/18/14 0420 05/19/14 0418  WBC 15.5* 18.0* 18.9* 18.0* 16.7*  NEUTROABS  --   --   --  14.1*  --   HGB 9.4* 7.3* 9.9* 9.6* 9.2*  HCT 28.7*  22.0* 30.0* 29.0* 27.3*  MCV 86.7 86.6 84.7 84.5 85.8  PLT 147* 136* 80* 103* 97*   Cardiac Enzymes: No results for input(s): CKTOTAL, CKMB, CKMBINDEX, TROPONINI in the last 168 hours. BNP: BNP (last 3 results) No results for input(s): PROBNP in the last 8760 hours. CBG: No results for input(s): GLUCAP in the last 168 hours.     SignedNita Sells  [facility] 05/21/2014, 10:58 AM

## 2014-05-21 NOTE — Progress Notes (Deleted)
Physician Discharge Summary  ALLISON SILVA JQB:[id number] DOB: 1945-09-12 DOA: 05/02/2014  PCP: [name], MD  Admit date: 05/02/2014 Discharge date: 05/21/2014  Time spent: 32 minutes  Recommendations for Outpatient Follow-up:  1. Comfort care at home 2. Palliative wound management per Dr. Lucia Gaskins Gen surgery 3. Opiates and silver nitrate rx on d/c home -use silver nitrate or pack abdomen if he bleeds 4. Wound care-Wounds in abd to be moistened for 5 min c saline prior to wet to dry dressing changes   Discharge Diagnoses:  Principal Problem:   Perforation of colon cancer s/p colectomy/colostomy 05/03/2014 Active Problems:   Liver masses   Acute blood loss anemia   Malignant neoplasm of colon   Abdominal pain   Dehydration   Mucositis due to antineoplastic therapy   Intractable nausea and vomiting   Acute encephalopathy   Perforated bowel   Palliative care encounter   Essential hypertension   Colon cancer   AP (abdominal pain)   Volume overload   Scrotal edema   Hypokalemia   Protein-calorie malnutrition, severe   Loss of appetite   Adjustment disorder with mixed anxiety and depressed mood   Discharge Condition: gaurded-imminenet demise expected  Diet recommendation: comfort  Filed Weights   05/18/14 0455 05/19/14 0622 05/20/14 0411  Weight: 89.449 kg (197 lb 3.2 oz) 87.952 kg (193 lb 14.4 oz) 86.728 kg (191 lb 3.2 oz)    Brief history 69 y.o. male with Past medical history of metastatic colon cancer to liver, hypertension, coronary artery disease status post CABG, diastolic dysfunction presented with abdominal pain, nausea, and vomiting with generalized weakness. CT of the abdomen and pelvis on the day of admission revealed sigmoid colonic perforation with diffuse Lahey rim enhancing large volume ascites. General surgery was consulted, and the patient was taken to the operating room where he had a hemicolectomy and end colostomy on 05/03/2014. The patient's  diet was gradually advanced as his bowel function returned. Palliative medicine was consulted and the patient was changed to DO NOT RESUSCITATE. Oncology weighed back in on 05/18/14 and have expressed there are slim odd for any further type o fchemotherapy given the burden of his disease and poor ECOG status Patient has elected for comfort care at home with partner.    #1 abdominal pain nausea vomiting secondary to perforated sigmoid colon carcinoma with multiple interloop abscesses and advanced metastatic disease to the liver status post abdominal exploration Colectomy + left colon colostomy 12/24 Surgeon requested to advise re: symtpomatic bleeding if d/c home c Hopsice #2 volume overload/scrotal edema Likely secondary to aggressive fluid hydration.  Continue Oral lasix PO to comfort Elevated Alk Phos 1200's, T bili up  Defer to Oncology No obvious reason other than potential Metastatic spread as seen on CT 1/5 Stop checking labs as per discussion c patient and family zofran ODT for N/v #3 left upper extremity edema Doppler ultrasound negative for DVT. Continue Lasix. #4 Anemia/TCP Secondary to chemotherapy. Counts improving. Patient has been seen by oncology. Patient lost almost 1 L of blood during surgery and received 2 units of packed red blood cells. Trasnfused again 05/16/13 NO further lab draws #5 tachycardia Was thought initially to be secondary to pain blood loss and dehydration. Patient in normal sinus rhythm. Continue current regimen of  beta blocker. Normal saline lock IV fluids. Follow. #6 hypertension Improved. Continue current regimen of Lopressor. #7 chronic diastolic heart failure/volume overload Volume overloaded on examination. Patient's current weight 93kg. Patient's weight on admission was approximately 78.6  kg.  Stop checking I/o and labs now as more comfort care Lasix 40 po daily and extra dosing to comfort #8 history of coronary artery disease status post  CABG Patient with some shortness of breath on ambulation however denies any chest pain. Continue beta blocker.  #9 metastatic colon cancer Outpatient follow-up with oncology. #10 hypokalemia/hypomagnesemia Likely secondary to diureses. Repleted this am. #11 goals of care Patient has been seen by palliative care. Patient is currently DO NOT RESUSCITATE.   Code Status: DO NOT RESUSCITATE, comfort c home hospice   Consultants:  Palliative care: Dr. [name] 05/03/2014  Oncology: Dr. should that 05/03/2014   Gen. surgery: Dr. Hassell Done 05/03/2014  Procedures:  CT abdomen and pelvis 05/02/2014  CT head 05/02/2014  Chest x-ray 05/03/2014  Scrotal ultrasound 05/10/2014  Abdominal exploration with distal left colon and sigmoid colon resection and and left colon colostomy 05/03/2014 per Dr. Lucia Gaskins  2 units packed red blood cells  Left upper extremity Doppler ultrasound 05/09/2014  I U PRBC 05/16/14  Antibiotics:  Oral Levaquin 05/10/2014>>> 05/14/2014  Oral Flagyl 05/10/2014>>>>> 05/14/2014  IV cefepime 05/03/2014>>>>> 05/10/2014  IV ciprofloxacin 05/03/2014>>> 05/03/2014  IV Flagyl 05/03/2014>>>>> 05/10/2014    Discharge Exam: Filed Vitals:   05/21/14 0600  BP: 148/56  Pulse: 66  Temp: 99.5 F (37.5 C)  Resp: 20   Alert pleasantoriented, in NAD General: nad Cardiovascular: s1 s 2no m/r/g Respiratory: Coloscomty stable  Discharge Instructions   Discharge Instructions    Diet - low sodium heart healthy    Complete by:  As directed      Discharge instructions    Complete by:  As directed   Patient to d/c home with home hospice     Increase activity slowly    Complete by:  As directed           Current Discharge Medication List    START taking these medications   Details  oxyCODONE-acetaminophen (PERCOCET/ROXICET) 5-325 MG per tablet Take 1-2 tablets by mouth every 3 (three) hours as needed for moderate pain. Qty: 30 tablet, Refills: 0    silver  nitrate applicators 35-68 % applicator Apply 10 Sticks topically as needed for bleeding. Qty: 100 each, Refills: 0      CONTINUE these medications which have CHANGED   Details  furosemide (LASIX) 40 MG tablet Take 1 tablet (40 mg total) by mouth daily. Qty: 30 tablet, Refills: 5      CONTINUE these medications which have NOT CHANGED   Details  atenolol (TENORMIN) 25 MG tablet take 1/2 tablet by mouth twice a day Qty: 90 tablet, Refills: 0    docusate sodium (COLACE) 100 MG capsule Take 200 mg by mouth daily as needed.     LORazepam (ATIVAN) 0.5 MG tablet Take 1 tablet (0.5 mg total) by mouth every 4 (four) hours as needed (nausea). Qty: 30 tablet, Refills: 0    polyvinyl alcohol (LIQUIFILM TEARS) 1.4 % ophthalmic solution Place 1-2 drops into both eyes daily as needed for dry eyes.    ondansetron (ZOFRAN ODT) 8 MG disintegrating tablet Take 1 tablet (8 mg total) by mouth every 8 (eight) hours as needed for nausea or vomiting. Qty: 20 tablet, Refills: 0      STOP taking these medications     lidocaine-prilocaine (EMLA) cream      Multiple Vitamin (MULTIVITAMIN WITH MINERALS) TABS tablet      OVER THE COUNTER MEDICATION      oxyCODONE-acetaminophen (PERCOCET) 7.5-325 MG per tablet  potassium chloride SA (K-DUR,KLOR-CON) 20 MEQ tablet      vitamin C (ASCORBIC ACID) 500 MG tablet      potassium chloride SA (K-DUR,KLOR-CON) 20 MEQ tablet      prochlorperazine (COMPAZINE) 10 MG tablet      tadalafil (CIALIS) 20 MG tablet        Allergies  Allergen Reactions  . Simvastatin Other (See Comments)    "Muscle aches"   Follow-up Information    Follow up with [facility].   Why:  please contact when you arrive home   Contact information:   [address] [place] [address] [phone]       Follow up with [facility], MD. Schedule an appointment as soon as possible for a visit in 4 weeks.   Specialty:  General Surgery   Why:  For wound  re-check   Contact information:   [place] [id number] [phone]        The results of significant diagnostics from this hospitalization (including imaging, microbiology, ancillary and laboratory) are listed below for reference.    Significant Diagnostic Studies: Ct Head Wo Contrast  05/02/2014   CLINICAL DATA:  Increased confusion today with nausea and vomiting  EXAM: CT HEAD WITHOUT CONTRAST  TECHNIQUE: Contiguous axial images were obtained from the base of the skull through the vertex without intravenous contrast.  COMPARISON:  None  FINDINGS: Advanced atrophy with prominence of the bifrontal extra-axial spaces. Gray-white differentiation is maintained. No CT evidence of acute large territory infarct. No intraparenchymal extra-axial mass or hemorrhage. Unchanged size and configuration of the ventricles and basilar cisterns. No midline shift.  Limited visualization of the paranasal sinuses and mastoid air cells are normal. No air-fluid level. There is potential enlargement of the sella (image 9, series 3).  Regional soft tissues appear normal. Paranasal sinuses and mastoid air cells are normal. No displaced calvarial fracture.  IMPRESSION: 1. Advanced atrophy without acute intracranial process. 2. Nonspecific potential enlargement of the sella. Further evaluation could be performed with nonemergent brain MRI as clinically indicated.   Electronically Signed   By: [name] M.D.   On: 05/02/2014 21:51   US Scrotum  05/11/2014   CLINICAL DATA:  Scrotal edema  EXAM: SCROTAL ULTRASOUND  DOPPLER ULTRASOUND OF THE TESTICLES  TECHNIQUE: Complete ultrasound examination of the testicles, epididymis, and other scrotal structures was performed. Color and spectral Doppler ultrasound were also utilized to evaluate blood flow to the testicles.  COMPARISON:  None.  FINDINGS: Right testicle  Measurements: 2.9 x 2.1 x 2.3 cm. Two coarse calcifications noted. A curvilinear hypoechoic  structure at the lower pole on the sagittal projection is likely a vein. No mass was seen on transverse imaging and no mass was seen during real-time sonographer scan.  Left testicle  Measurements: 2.8 x 2.4 x 2.2 cm. No mass or microlithiasis visualized.  Right epididymis: 9 mm cyst in the right epididymis, incidental. No enlargement or abnormal vascularity.  Left epididymis:  No enlargement or abnormal vascularity.  Hydrocele:  Small and simple bilateral hydrocele.  Varicocele:  None visualized.  Pulsed Doppler interrogation of both testes demonstrates low resistance arterial and venous waveforms bilaterally.  There is herniated fat herniation the left inguinal canal which is known based on previous CT.  The scrotal wall is diffusely edematous and thickened. There is no bright spectral reflectors suggestive of emphysema. No evidence of abscess.  IMPRESSION: 1. Marked scrotal wall edema without abscess. 2.  No acute intrascrotal finding.   Electronically Signed   By: Jorje Guild M.D.   On: 05/11/2014 01:35   Ct Abdomen Pelvis W Contrast  05/15/2014   CLINICAL DATA:  Perforation of colon.  EXAM: CT ABDOMEN AND PELVIS WITH CONTRAST  TECHNIQUE: Multidetector CT imaging of the abdomen and pelvis was performed using the standard protocol following bolus administration of intravenous contrast.  CONTRAST:  124m OMNIPAQUE IOHEXOL 300 MG/ML  SOLN  COMPARISON:  CT scan of May 02, 2014.  FINDINGS: Severe degenerative disc disease is noted at L5-S1. Moderate bilateral pleural effusions are noted with adjacent subsegmental atelectasis.  Mild anasarca is noted which is increased compared to prior exam. Status post left colon resection with colostomy seen in left lower quadrant. Mild ascites is noted. There is no evidence of bowel obstruction. Cholelithiasis is again noted. Multiple hepatic masses are noted consistent with metastatic disease. These are unchanged compared to prior exam. Adrenal glands and kidneys appear  normal. No hydronephrosis or renal obstruction is noted. Atherosclerotic calcifications of abdominal aorta are noted without aneurysm formation. Urinary bladder is decompressed. Small fat containing left inguinal hernia is noted. No definite abscess is seen at this time. Mild pneumoperitoneum is noted in the epigastric region most consistent with recent surgery. 18 x 11 mm retroperitoneal lymph node is noted on image number 28 which is unchanged compared to prior exam.  IMPRESSION: Status post partial left colectomy with colostomy seen in left lower quadrant. No abscess is noted at this time. Mild pneumoperitoneum is noted in the epigastric region consistent with recent surgery.  Moderate bilateral pleural effusions are noted with adjacent subsegmental atelectasis.  Stable multiple hepatic metastatic lesions compared to prior exam. Stable enlarged retroperitoneal lymph node.  New mild ascites is noted in the abdomen.  Mild anasarca is noted.  Cholelithiasis is again noted.   Electronically Signed   By: JSabino DickM.D.   On: 05/15/2014 16:23   Ct Abdomen Pelvis W Contrast  05/02/2014   CLINICAL DATA:  Mid abdominal pain, nausea and vomiting, after chemotherapy. History of colon cancer.  EXAM: CT ABDOMEN AND PELVIS WITH CONTRAST  TECHNIQUE: Multidetector CT imaging of the abdomen and pelvis was performed using the standard protocol following bolus administration of intravenous contrast.  CONTRAST:  103mOMNIPAQUE IOHEXOL 300 MG/ML  SOLN  COMPARISON:  PET-CT 03/07/2014, CT abdomen/ pelvis 02/22/2014  FINDINGS: Evidence of CABG partly visualized. Trace pleural effusions are noted with associated bibasilar linear presumed atelectasis.  A few foci of free intraperitoneal air are identified image 19. Epicardial nodes are identified, 0.6 cm diameter image 12.  Innumerable hepatic masses are reidentified with overall nodular liver contour suggesting underlying cirrhosis. Representative posterior segment right hepatic  lobe mass is slightly smaller, now 5.4 x 4.7 cm image 22, previously 6.8 x 5.8 cm.  Porta hepatis node is stable measuring 0.9 cm image 30. Stable subcentimeter periaortic lymph nodes.  There are numerous loops of small bowel with diameter at upper limits of normal, representative 3.1 cm diameter loop of bowel within the left upper quadrant image 32, but which demonstrate abnormal wall thickening. Within the colon, the previously seen area of masslike sigmoid wall thickening is reidentified, image 71. There is a new rim enhancing gas/ fluid collection compatible with abscess from colonic perforation image 70, measuring 3.5 x 3.3 cm. Foci of gas are noted tracking throughout the mesentery as well. Large volume ascites is noted with interval development of rim enhancement suggesting superimposed infection, for example image  72. Pericolonic nodes are reidentified, representative 0.6 cm node image 64. There is increased pericolonic stranding. Increased stranding and fluid infiltration of the mesentery/omentum is noted without measurable nodularity.  Aortic atheromatous calcification without aneurysm. Fat containing left inguinal hernia noted. The bladder is unremarkable.  Evidence of right L5 laminectomy. Multilevel lumbar spine disc degenerative change noted. No focal osseous lesion is identified. The bones are subjectively mildly sclerotic, unchanged, raising the question of possible metastatic infiltration although this could be a normal variant.  IMPRESSION: Interval sigmoid colonic perforation adjacent to the previously seen masslike wall thickening compatible with primary colonic carcinoma and intra-abdominal abscess formation.  Small bowel wall thickening may suggest secondary enteritis. No evidence for small bowel obstruction at this time.  Diffusely rim enhancing large volume ascites and mesenteric infiltration suggesting peritonitis.  Decrease in size of hepatic representative metastatic lesion.  Critical  Value/emergent results were called by telephone at the time of interpretation on 05/02/2014 at 11:38 pm to [facility] for Dr. Berle Mull , who verbally acknowledged these results.   Electronically Signed   By: Conchita Paris M.D.   On: 05/02/2014 23:40   [place] Art/ven Flow Abd Pelv Doppler  05/11/2014   CLINICAL DATA:  Scrotal edema  EXAM: SCROTAL ULTRASOUND  DOPPLER ULTRASOUND OF THE TESTICLES  TECHNIQUE: Complete ultrasound examination of the testicles, epididymis, and other scrotal structures was performed. Color and spectral Doppler ultrasound were also utilized to evaluate blood flow to the testicles.  COMPARISON:  None.  FINDINGS: Right testicle  Measurements: 2.9 x 2.1 x 2.3 cm. Two coarse calcifications noted. A curvilinear hypoechoic structure at the lower pole on the sagittal projection is likely a vein. No mass was seen on transverse imaging and no mass was seen during real-time sonographer scan.  Left testicle  Measurements: 2.8 x 2.4 x 2.2 cm. No mass or microlithiasis visualized.  Right epididymis: 9 mm cyst in the right epididymis, incidental. No enlargement or abnormal vascularity.  Left epididymis:  No enlargement or abnormal vascularity.  Hydrocele:  Small and simple bilateral hydrocele.  Varicocele:  None visualized.  Pulsed Doppler interrogation of both testes demonstrates low resistance arterial and venous waveforms bilaterally.  There is herniated fat herniation the left inguinal canal which is known based on previous CT.  The scrotal wall is diffusely edematous and thickened. There is no bright spectral reflectors suggestive of emphysema. No evidence of abscess.  IMPRESSION: 1. Marked scrotal wall edema without abscess. 2. No acute intrascrotal finding.   Electronically Signed   By: Jorje Guild M.D.   On: 05/11/2014 01:35   Dg Chest Port 1 View  05/03/2014   CLINICAL DATA:  Central line placement.  EXAM: PORTABLE CHEST - 1 VIEW  COMPARISON:  02/22/2014  FINDINGS: Sternotomy wires are  present. Nasogastric tube has tip and side-port over the stomach in the left upper quadrant. Left IJ central venous catheter has tip in the region of the cavoatrial junction. Right IJ central venous catheter has tip over the region of the SVC.  Lungs are hypoinflated with mild linear density in the left base likely atelectasis. There is subtle density in the medial right base likely atelectasis. No definite effusion or pneumothorax. Cardiomediastinal silhouette and remainder of the exam is unchanged.  IMPRESSION: Hypoinflation with mild bibasilar opacification likely atelectasis.  Tubes and lines as described.  No definite pneumothorax.   Electronically Signed   By: Marin Olp M.D.   On: 05/03/2014 18:53    Microbiology: Recent Results (from the  past 240 hour(s))  Clostridium Difficile by PCR     Status: None   Collection Time: 05/15/14  7:35 AM  Result Value Ref Range Status   C difficile by pcr NEGATIVE NEGATIVE Final    Comment: Performed at [facility]: Basic Metabolic Panel:  Recent Labs Lab 05/15/14 0500 05/16/14 0600 05/17/14 0507 05/18/14 0420 05/19/14 0418  NA 138 141 138 136 137  K 3.2* 4.1 3.1* 3.2* 2.8*  CL 109 110 106 102 102  CO2 _0 GLUCOSE 140* 109* 138* 110* 121*  BUN _1 CREATININE 0.86 0.87 0.76 0.89 0.68  CALCIUM 7.3* 7.4* 7.4* 7.2* 7.0*  MG  --  1.6  --   --   --    Liver Function Tests:  Recent Labs Lab 05/16/14 0600 05/17/14 0507 05/18/14 0420  AST 41* 43* 28  ALT _2 ALKPHOS 1298* 1184* 1170*  BILITOT 1.4* 1.4* 1.3*  PROT 4.6* 4.9* 4.8*  ALBUMIN 1.6* 1.9* 1.8*   No results for input(s): LIPASE, AMYLASE in the last 168 hours. No results for input(s): AMMONIA in the last 168 hours. CBC:  Recent Labs Lab 05/15/14 0500 05/16/14 0600 05/17/14 0507 05/18/14 0420 05/19/14 0418  WBC 15.5* 18.0* 18.9* 18.0* 16.7*  NEUTROABS  --   --   --  14.1*  --   HGB 9.4* 7.3* 9.9* 9.6* 9.2*  HCT 28.7*  22.0* 30.0* 29.0* 27.3*  MCV 86.7 86.6 84.7 84.5 85.8  PLT 147* 136* 80* 103* 97*   Cardiac Enzymes: No results for input(s): CKTOTAL, CKMB, CKMBINDEX, TROPONINI in the last 168 hours. BNP: BNP (last 3 results) No results for input(s): PROBNP in the last 8760 hours. CBG: No results for input(s): GLUCAP in the last 168 hours.     SignedNita Sells  [facility] 05/21/2014, 10:49 AM

## 2014-07-10 DEATH — deceased

## 2015-10-25 ENCOUNTER — Other Ambulatory Visit: Payer: Self-pay | Admitting: Nurse Practitioner

## 2016-02-02 IMAGING — PT NM PET TUM IMG INITIAL (PI) SKULL BASE T - THIGH
1 of 8 series · 1 of 25 positions shown · non-contrast
Comparison: MRI abdomen dated 02/27/2014. CT abdomen pelvis dated
02/22/2014.

CLINICAL DATA: Initial treatment strategy for adenocarcinoma with
liver metastases, presumed colon primary.

EXAM:
NUCLEAR MEDICINE PET SKULL BASE TO THIGH
TECHNIQUE: 10.9 mCi F-18 FDG was injected intravenously. Full-ring PET imaging
was performed from the skull base to thigh after the radiotracer. CT
data was obtained and used for attenuation correction and anatomic
localization.
FASTING BLOOD GLUCOSE:  Value: 99 mg/dl

[Series 4: ct sk_thigh 5.0 b31f · axial · 5.0mm · 0.98mm/px · 1 of 238 slices shown]
[im 238/238  brain]
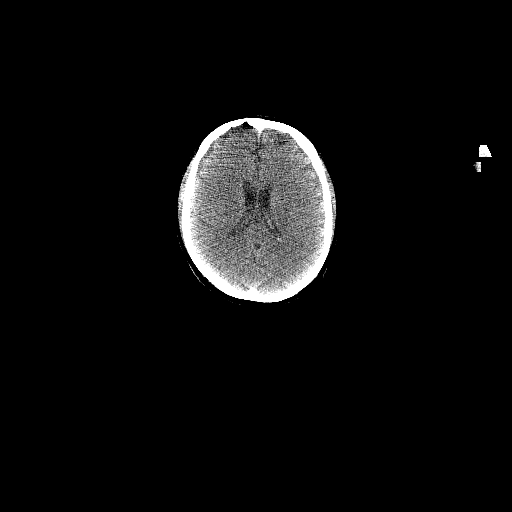

[1 of 25 positions shown; findings below may reference images not displayed]

FINDINGS: NECK

No hypermetabolic lymph nodes in the neck.

CHEST

Evaluation of lung parenchyma is constrained by respiratory motion.
No suspicious pulmonary nodules on the CT scan.

Moderate right and small left pleural effusions. Associated
compressive atelectasis in the right lower lobe.

No hypermetabolic mediastinal or hilar nodes.

ABDOMEN/PELVIS

Multifocal hypermetabolic lesions throughout the liver,
corresponding to known metastatic disease. Max SUV in the central
liver is 9.3.

No abnormal hypermetabolic activity within the pancreas, adrenal
glands, or spleen.

Hypermetabolic apple core lesion involving the sigmoid colon (series
4/image 169), max SUV 17.5, worrisome for primary colonic
adenocarcinoma.

Small retroperitoneal nodes. Small upper abdominal nodes, including
an 11 mm short axis node in the porta hepatis (series 4/image 116),
without convincing hypermetabolism.

Moderate abdominopelvic ascites. Stranding/fluid along the omentum.
No gross peritoneal disease/omental caking.

Cholelithiasis, without associated inflammatory changes. Vascular
calcifications. Bladder is mildly thick-walled but underdistended.
Small fat containing left inguinal hernia.

SKELETON

Heterogeneous osseous uptake without dominant focal lesion to
suggest osseous metastasis.
IMPRESSION: Hypermetabolic lesion involving the sigmoid colon, worrisome for
primary colonic adenocarcinoma.

Multifocal hypermetabolic lesions throughout the liver,
corresponding to known metastatic disease.

Moderate abdominopelvic ascites. Although suspicious, there is no
gross peritoneal disease/omental caking.

Moderate right and small left pleural effusions.
